# Patient Record
Sex: Female | Born: 1965 | Race: Black or African American | Hispanic: No | Marital: Married | Smoking: Never smoker
Health system: Southern US, Community
[De-identification: ages and names within clinical notes are randomized; demographics above are authoritative.]

## PROBLEM LIST (undated history)

## (undated) DIAGNOSIS — T7840XA Allergy, unspecified, initial encounter: Secondary | ICD-10-CM

## (undated) DIAGNOSIS — L309 Dermatitis, unspecified: Secondary | ICD-10-CM

## (undated) DIAGNOSIS — N3946 Mixed incontinence: Secondary | ICD-10-CM

## (undated) DIAGNOSIS — Z975 Presence of (intrauterine) contraceptive device: Secondary | ICD-10-CM

## (undated) DIAGNOSIS — Z973 Presence of spectacles and contact lenses: Secondary | ICD-10-CM

## (undated) DIAGNOSIS — K219 Gastro-esophageal reflux disease without esophagitis: Secondary | ICD-10-CM

## (undated) DIAGNOSIS — J302 Other seasonal allergic rhinitis: Secondary | ICD-10-CM

## (undated) DIAGNOSIS — I10 Essential (primary) hypertension: Secondary | ICD-10-CM

## (undated) HISTORY — DX: Gastro-esophageal reflux disease without esophagitis: K21.9

## (undated) HISTORY — DX: Other seasonal allergic rhinitis: J30.2

## (undated) HISTORY — DX: Allergy, unspecified, initial encounter: T78.40XA

## (undated) HISTORY — PX: NO PAST SURGERIES: SHX2092

## (undated) HISTORY — DX: Dermatitis, unspecified: L30.9

## (undated) HISTORY — DX: Presence of spectacles and contact lenses: Z97.3

## (undated) HISTORY — DX: Mixed incontinence: N39.46

## (undated) HISTORY — PX: WISDOM TOOTH EXTRACTION: SHX21

## (undated) HISTORY — DX: Presence of (intrauterine) contraceptive device: Z97.5

---

## 2002-04-22 ENCOUNTER — Inpatient Hospital Stay (HOSPITAL_COMMUNITY): Admission: AD | Admit: 2002-04-22 | Discharge: 2002-04-28 | Payer: Self-pay | Admitting: *Deleted

## 2002-04-23 ENCOUNTER — Encounter: Payer: Self-pay | Admitting: *Deleted

## 2002-04-26 ENCOUNTER — Encounter: Payer: Self-pay | Admitting: Family Medicine

## 2002-05-05 ENCOUNTER — Encounter: Admission: RE | Admit: 2002-05-05 | Discharge: 2002-05-05 | Payer: Self-pay | Admitting: *Deleted

## 2002-05-06 ENCOUNTER — Encounter: Admission: RE | Admit: 2002-05-06 | Discharge: 2002-05-06 | Payer: Self-pay | Admitting: *Deleted

## 2002-05-12 ENCOUNTER — Encounter: Admission: RE | Admit: 2002-05-12 | Discharge: 2002-05-12 | Payer: Self-pay | Admitting: *Deleted

## 2002-05-26 ENCOUNTER — Encounter: Admission: RE | Admit: 2002-05-26 | Discharge: 2002-05-26 | Payer: Self-pay | Admitting: *Deleted

## 2002-06-07 ENCOUNTER — Inpatient Hospital Stay (HOSPITAL_COMMUNITY): Admission: AD | Admit: 2002-06-07 | Discharge: 2002-06-13 | Payer: Self-pay | Admitting: Obstetrics and Gynecology

## 2002-06-07 ENCOUNTER — Encounter: Payer: Self-pay | Admitting: Obstetrics and Gynecology

## 2002-06-14 ENCOUNTER — Encounter: Payer: Self-pay | Admitting: Obstetrics and Gynecology

## 2002-06-14 ENCOUNTER — Inpatient Hospital Stay (HOSPITAL_COMMUNITY): Admission: AD | Admit: 2002-06-14 | Discharge: 2002-06-18 | Payer: Self-pay | Admitting: Obstetrics and Gynecology

## 2002-07-20 ENCOUNTER — Encounter: Admission: RE | Admit: 2002-07-20 | Discharge: 2002-07-20 | Payer: Self-pay | Admitting: *Deleted

## 2002-08-19 ENCOUNTER — Emergency Department (HOSPITAL_COMMUNITY): Admission: EM | Admit: 2002-08-19 | Discharge: 2002-08-19 | Payer: Self-pay | Admitting: Emergency Medicine

## 2002-09-06 ENCOUNTER — Emergency Department (HOSPITAL_COMMUNITY): Admission: EM | Admit: 2002-09-06 | Discharge: 2002-09-06 | Payer: Self-pay | Admitting: Emergency Medicine

## 2003-01-08 ENCOUNTER — Emergency Department (HOSPITAL_COMMUNITY): Admission: AD | Admit: 2003-01-08 | Discharge: 2003-01-08 | Payer: Self-pay | Admitting: Family Medicine

## 2003-01-13 ENCOUNTER — Inpatient Hospital Stay (HOSPITAL_COMMUNITY): Admission: AD | Admit: 2003-01-13 | Discharge: 2003-01-13 | Payer: Self-pay | Admitting: Obstetrics and Gynecology

## 2003-01-22 DIAGNOSIS — Z975 Presence of (intrauterine) contraceptive device: Secondary | ICD-10-CM

## 2003-01-22 HISTORY — DX: Presence of (intrauterine) contraceptive device: Z97.5

## 2003-02-02 ENCOUNTER — Encounter: Admission: RE | Admit: 2003-02-02 | Discharge: 2003-02-02 | Payer: Self-pay | Admitting: *Deleted

## 2003-02-16 ENCOUNTER — Encounter: Admission: RE | Admit: 2003-02-16 | Discharge: 2003-02-16 | Payer: Self-pay | Admitting: *Deleted

## 2003-03-02 ENCOUNTER — Encounter: Admission: RE | Admit: 2003-03-02 | Discharge: 2003-03-02 | Payer: Self-pay | Admitting: *Deleted

## 2003-03-09 ENCOUNTER — Ambulatory Visit (HOSPITAL_COMMUNITY): Admission: RE | Admit: 2003-03-09 | Discharge: 2003-03-09 | Payer: Self-pay | Admitting: *Deleted

## 2003-03-09 ENCOUNTER — Encounter: Admission: RE | Admit: 2003-03-09 | Discharge: 2003-03-09 | Payer: Self-pay | Admitting: *Deleted

## 2003-03-23 ENCOUNTER — Encounter: Admission: RE | Admit: 2003-03-23 | Discharge: 2003-03-23 | Payer: Self-pay | Admitting: *Deleted

## 2003-04-06 ENCOUNTER — Encounter: Admission: RE | Admit: 2003-04-06 | Discharge: 2003-04-06 | Payer: Self-pay | Admitting: *Deleted

## 2003-04-20 ENCOUNTER — Encounter: Admission: RE | Admit: 2003-04-20 | Discharge: 2003-04-20 | Payer: Self-pay | Admitting: *Deleted

## 2003-05-04 ENCOUNTER — Encounter: Admission: RE | Admit: 2003-05-04 | Discharge: 2003-05-04 | Payer: Self-pay | Admitting: Obstetrics & Gynecology

## 2003-05-11 ENCOUNTER — Encounter: Admission: RE | Admit: 2003-05-11 | Discharge: 2003-05-11 | Payer: Self-pay | Admitting: *Deleted

## 2003-05-11 ENCOUNTER — Ambulatory Visit (HOSPITAL_COMMUNITY): Admission: RE | Admit: 2003-05-11 | Discharge: 2003-05-11 | Payer: Self-pay | Admitting: Obstetrics and Gynecology

## 2003-05-18 ENCOUNTER — Encounter: Admission: RE | Admit: 2003-05-18 | Discharge: 2003-05-18 | Payer: Self-pay | Admitting: Obstetrics & Gynecology

## 2003-06-01 ENCOUNTER — Encounter: Admission: RE | Admit: 2003-06-01 | Discharge: 2003-06-01 | Payer: Self-pay | Admitting: *Deleted

## 2003-06-01 ENCOUNTER — Ambulatory Visit (HOSPITAL_COMMUNITY): Admission: RE | Admit: 2003-06-01 | Discharge: 2003-06-01 | Payer: Self-pay | Admitting: Obstetrics & Gynecology

## 2003-06-08 ENCOUNTER — Encounter: Admission: RE | Admit: 2003-06-08 | Discharge: 2003-06-08 | Payer: Self-pay | Admitting: *Deleted

## 2003-06-14 ENCOUNTER — Ambulatory Visit (HOSPITAL_COMMUNITY): Admission: RE | Admit: 2003-06-14 | Discharge: 2003-06-14 | Payer: Self-pay | Admitting: Obstetrics and Gynecology

## 2003-06-15 ENCOUNTER — Encounter: Admission: RE | Admit: 2003-06-15 | Discharge: 2003-06-15 | Payer: Self-pay | Admitting: *Deleted

## 2003-06-17 ENCOUNTER — Encounter: Admission: RE | Admit: 2003-06-17 | Discharge: 2003-06-17 | Payer: Self-pay | Admitting: Family Medicine

## 2003-06-22 ENCOUNTER — Encounter: Admission: RE | Admit: 2003-06-22 | Discharge: 2003-06-22 | Payer: Self-pay | Admitting: *Deleted

## 2003-06-24 ENCOUNTER — Encounter: Admission: RE | Admit: 2003-06-24 | Discharge: 2003-06-24 | Payer: Self-pay | Admitting: Obstetrics and Gynecology

## 2003-06-29 ENCOUNTER — Encounter: Admission: RE | Admit: 2003-06-29 | Discharge: 2003-06-29 | Payer: Self-pay | Admitting: *Deleted

## 2003-07-01 ENCOUNTER — Encounter: Admission: RE | Admit: 2003-07-01 | Discharge: 2003-07-01 | Payer: Self-pay | Admitting: Family Medicine

## 2003-07-05 ENCOUNTER — Observation Stay (HOSPITAL_COMMUNITY): Admission: AD | Admit: 2003-07-05 | Discharge: 2003-07-05 | Payer: Self-pay | Admitting: *Deleted

## 2003-07-07 ENCOUNTER — Inpatient Hospital Stay (HOSPITAL_COMMUNITY): Admission: AD | Admit: 2003-07-07 | Discharge: 2003-07-09 | Payer: Self-pay | Admitting: Obstetrics and Gynecology

## 2003-07-22 ENCOUNTER — Encounter (INDEPENDENT_AMBULATORY_CARE_PROVIDER_SITE_OTHER): Payer: Self-pay | Admitting: *Deleted

## 2003-07-22 LAB — CONVERTED CEMR LAB

## 2003-07-23 ENCOUNTER — Emergency Department (HOSPITAL_COMMUNITY): Admission: EM | Admit: 2003-07-23 | Discharge: 2003-07-23 | Payer: Self-pay | Admitting: *Deleted

## 2003-09-28 ENCOUNTER — Ambulatory Visit: Payer: Self-pay | Admitting: Family Medicine

## 2003-12-19 ENCOUNTER — Ambulatory Visit: Payer: Self-pay | Admitting: Family Medicine

## 2003-12-19 ENCOUNTER — Other Ambulatory Visit: Admission: RE | Admit: 2003-12-19 | Discharge: 2003-12-19 | Payer: Self-pay | Admitting: Family Medicine

## 2004-05-14 ENCOUNTER — Ambulatory Visit: Payer: Self-pay | Admitting: Family Medicine

## 2004-06-22 ENCOUNTER — Ambulatory Visit: Payer: Self-pay | Admitting: Family Medicine

## 2004-08-26 ENCOUNTER — Emergency Department (HOSPITAL_COMMUNITY): Admission: EM | Admit: 2004-08-26 | Discharge: 2004-08-26 | Payer: Self-pay | Admitting: Emergency Medicine

## 2004-08-27 ENCOUNTER — Ambulatory Visit: Payer: Self-pay | Admitting: Sports Medicine

## 2004-09-12 ENCOUNTER — Ambulatory Visit: Payer: Self-pay | Admitting: Family Medicine

## 2004-10-09 ENCOUNTER — Ambulatory Visit (HOSPITAL_COMMUNITY): Admission: RE | Admit: 2004-10-09 | Discharge: 2004-10-09 | Payer: Self-pay | Admitting: Family Medicine

## 2004-10-09 ENCOUNTER — Ambulatory Visit: Payer: Self-pay | Admitting: Family Medicine

## 2004-12-21 ENCOUNTER — Ambulatory Visit: Payer: Self-pay | Admitting: Family Medicine

## 2005-01-01 ENCOUNTER — Ambulatory Visit: Payer: Self-pay | Admitting: Family Medicine

## 2005-01-08 ENCOUNTER — Ambulatory Visit: Payer: Self-pay | Admitting: Sports Medicine

## 2005-01-31 ENCOUNTER — Ambulatory Visit: Payer: Self-pay | Admitting: Family Medicine

## 2005-01-31 ENCOUNTER — Encounter: Admission: RE | Admit: 2005-01-31 | Discharge: 2005-01-31 | Payer: Self-pay | Admitting: Sports Medicine

## 2005-02-14 ENCOUNTER — Ambulatory Visit (HOSPITAL_COMMUNITY): Admission: RE | Admit: 2005-02-14 | Discharge: 2005-02-14 | Payer: Self-pay | Admitting: Cardiology

## 2005-03-20 ENCOUNTER — Ambulatory Visit: Payer: Self-pay | Admitting: Family Medicine

## 2005-05-17 ENCOUNTER — Ambulatory Visit: Payer: Self-pay | Admitting: Family Medicine

## 2005-05-22 ENCOUNTER — Emergency Department (HOSPITAL_COMMUNITY): Admission: EM | Admit: 2005-05-22 | Discharge: 2005-05-22 | Payer: Self-pay | Admitting: Family Medicine

## 2005-05-30 ENCOUNTER — Ambulatory Visit: Payer: Self-pay | Admitting: Family Medicine

## 2005-06-05 ENCOUNTER — Ambulatory Visit: Payer: Self-pay | Admitting: Family Medicine

## 2005-06-13 ENCOUNTER — Ambulatory Visit: Payer: Self-pay | Admitting: Sports Medicine

## 2005-07-07 ENCOUNTER — Emergency Department (HOSPITAL_COMMUNITY): Admission: EM | Admit: 2005-07-07 | Discharge: 2005-07-07 | Payer: Self-pay | Admitting: Emergency Medicine

## 2005-07-17 ENCOUNTER — Ambulatory Visit: Payer: Self-pay | Admitting: Family Medicine

## 2005-09-19 ENCOUNTER — Ambulatory Visit: Payer: Self-pay | Admitting: Family Medicine

## 2005-10-04 ENCOUNTER — Ambulatory Visit: Payer: Self-pay | Admitting: Family Medicine

## 2005-10-14 ENCOUNTER — Ambulatory Visit: Payer: Self-pay | Admitting: Family Medicine

## 2005-10-16 ENCOUNTER — Encounter: Admission: RE | Admit: 2005-10-16 | Discharge: 2006-01-14 | Payer: Self-pay | Admitting: Family Medicine

## 2005-10-31 ENCOUNTER — Ambulatory Visit: Payer: Self-pay | Admitting: Family Medicine

## 2006-03-20 DIAGNOSIS — F411 Generalized anxiety disorder: Secondary | ICD-10-CM | POA: Insufficient documentation

## 2006-03-20 DIAGNOSIS — J309 Allergic rhinitis, unspecified: Secondary | ICD-10-CM | POA: Insufficient documentation

## 2006-03-20 DIAGNOSIS — R252 Cramp and spasm: Secondary | ICD-10-CM | POA: Insufficient documentation

## 2006-03-20 DIAGNOSIS — K219 Gastro-esophageal reflux disease without esophagitis: Secondary | ICD-10-CM | POA: Insufficient documentation

## 2006-03-20 DIAGNOSIS — IMO0001 Reserved for inherently not codable concepts without codable children: Secondary | ICD-10-CM | POA: Insufficient documentation

## 2006-03-20 DIAGNOSIS — E876 Hypokalemia: Secondary | ICD-10-CM | POA: Insufficient documentation

## 2006-03-20 DIAGNOSIS — I10 Essential (primary) hypertension: Secondary | ICD-10-CM | POA: Insufficient documentation

## 2006-03-20 DIAGNOSIS — F339 Major depressive disorder, recurrent, unspecified: Secondary | ICD-10-CM | POA: Insufficient documentation

## 2006-03-21 ENCOUNTER — Encounter (INDEPENDENT_AMBULATORY_CARE_PROVIDER_SITE_OTHER): Payer: Self-pay | Admitting: *Deleted

## 2010-02-11 ENCOUNTER — Encounter: Payer: Self-pay | Admitting: *Deleted

## 2010-10-09 ENCOUNTER — Emergency Department (HOSPITAL_COMMUNITY): Payer: Self-pay

## 2010-10-09 ENCOUNTER — Emergency Department (HOSPITAL_COMMUNITY)
Admission: EM | Admit: 2010-10-09 | Discharge: 2010-10-09 | Disposition: A | Discharge status: Home / Self Care | Payer: Self-pay | Attending: Emergency Medicine | Admitting: Emergency Medicine

## 2010-10-09 DIAGNOSIS — R0602 Shortness of breath: Secondary | ICD-10-CM | POA: Insufficient documentation

## 2010-10-09 DIAGNOSIS — F329 Major depressive disorder, single episode, unspecified: Secondary | ICD-10-CM | POA: Insufficient documentation

## 2010-10-09 DIAGNOSIS — K219 Gastro-esophageal reflux disease without esophagitis: Secondary | ICD-10-CM | POA: Insufficient documentation

## 2010-10-09 DIAGNOSIS — I10 Essential (primary) hypertension: Secondary | ICD-10-CM | POA: Insufficient documentation

## 2010-10-09 DIAGNOSIS — F3289 Other specified depressive episodes: Secondary | ICD-10-CM | POA: Insufficient documentation

## 2010-10-09 DIAGNOSIS — R1013 Epigastric pain: Secondary | ICD-10-CM | POA: Insufficient documentation

## 2010-10-09 DIAGNOSIS — R0789 Other chest pain: Secondary | ICD-10-CM | POA: Insufficient documentation

## 2010-10-09 DIAGNOSIS — R110 Nausea: Secondary | ICD-10-CM | POA: Insufficient documentation

## 2010-10-09 LAB — CBC
HCT: 35.9 % — ABNORMAL LOW (ref 36.0–46.0)
Hemoglobin: 12 g/dL (ref 12.0–15.0)
MCH: 28.8 pg (ref 26.0–34.0)
MCHC: 33.4 g/dL (ref 30.0–36.0)
MCV: 86.3 fL (ref 78.0–100.0)
Platelets: 214 10*3/uL (ref 150–400)
RBC: 4.16 MIL/uL (ref 3.87–5.11)
RDW: 13.3 % (ref 11.5–15.5)
WBC: 7.4 10*3/uL (ref 4.0–10.5)

## 2010-10-09 LAB — BASIC METABOLIC PANEL
BUN: 9 mg/dL (ref 6–23)
CO2: 26 mEq/L (ref 19–32)
Calcium: 8.9 mg/dL (ref 8.4–10.5)
Chloride: 105 mEq/L (ref 96–112)
Creatinine, Ser: 0.55 mg/dL (ref 0.50–1.10)
GFR calc Af Amer: >60 mL/min (ref >60)
GFR calc non Af Amer: >60 mL/min (ref >60)
Glucose, Bld: 105 mg/dL — ABNORMAL HIGH (ref 70–99)
Potassium: 3.6 mEq/L (ref 3.5–5.1)
Sodium: 137 mEq/L (ref 135–145)

## 2010-10-09 LAB — DIFFERENTIAL
Basophils Absolute: 0 10*3/uL (ref 0.0–0.1)
Basophils Relative: 0 % (ref 0–1)
Eosinophils Absolute: 0.2 10*3/uL (ref 0.0–0.7)
Eosinophils Relative: 2 % (ref 0–5)
Lymphocytes Relative: 37 % (ref 12–46)
Lymphs Abs: 2.7 10*3/uL (ref 0.7–4.0)
Monocytes Absolute: 0.4 10*3/uL (ref 0.1–1.0)
Monocytes Relative: 6 % (ref 3–12)
Neutro Abs: 4 10*3/uL (ref 1.7–7.7)
Neutrophils Relative %: 54 % (ref 43–77)

## 2010-10-09 LAB — D-DIMER, QUANTITATIVE: D-Dimer, Quant: 0.22 ug/mL-FEU (ref 0.00–0.48)

## 2010-12-01 ENCOUNTER — Emergency Department (HOSPITAL_COMMUNITY)
Admission: EM | Admit: 2010-12-01 | Discharge: 2010-12-01 | Disposition: A | Discharge status: Home / Self Care | Payer: Self-pay | Attending: Emergency Medicine | Admitting: Emergency Medicine

## 2010-12-01 ENCOUNTER — Encounter: Payer: Self-pay | Admitting: *Deleted

## 2010-12-01 DIAGNOSIS — R070 Pain in throat: Secondary | ICD-10-CM | POA: Insufficient documentation

## 2010-12-01 DIAGNOSIS — I889 Nonspecific lymphadenitis, unspecified: Secondary | ICD-10-CM | POA: Insufficient documentation

## 2010-12-01 HISTORY — DX: Essential (primary) hypertension: I10

## 2010-12-01 MED ORDER — CEPHALEXIN 500 MG PO CAPS: 500.0000 mg | ORAL_CAPSULE | Freq: Four times a day (QID) | ORAL | Status: AC

## 2010-12-01 MED ORDER — NAPROXEN 500 MG PO TABS: 500.0000 mg | ORAL_TABLET | Freq: Two times a day (BID) | ORAL | Status: AC

## 2010-12-01 NOTE — ED Provider Notes (Signed)
History     CSN: [id number] Arrival date & time: 12/01/2010  2:51 PM   First MD Initiated Contact with Patient 12/01/10 1906      Chief Complaint  Patient presents with  . Sore Throat    problems swallowing    (Consider location/radiation/quality/duration/timing/severity/associated sxs/prior treatment) Patient is a 45 y.o. female presenting with pharyngitis. The history is provided by the patient (The patient complains of a sore throat and more pain in her left side up an).  Sore Throat This is a new problem. The current episode started 12 to 24 hours ago. The problem occurs constantly. The problem has not changed since onset.Pertinent negatives include no chest pain, no abdominal pain and no headaches. The symptoms are aggravated by nothing. The symptoms are relieved by nothing. She has tried nothing for the symptoms.    Past Medical History  Diagnosis Date  . Hypertension     History reviewed. No pertinent past surgical history.  No family history on file.  History  Substance Use Topics  . Smoking status: Never Smoker   . Smokeless tobacco: Not on file  . Alcohol Use: No    OB History    Grav Para Term Preterm Abortions TAB SAB Ect Mult Living                  Review of Systems  Constitutional: Negative for fatigue.  HENT: Positive for sore throat. Negative for congestion, sinus pressure and ear discharge.   Eyes: Negative for discharge.  Respiratory: Negative for cough.   Cardiovascular: Negative for chest pain.  Gastrointestinal: Negative for abdominal pain and diarrhea.  Genitourinary: Negative for frequency and hematuria.  Musculoskeletal: Negative for back pain.  Skin: Negative for rash.  Neurological: Negative for seizures and headaches.  Hematological: Negative.   Psychiatric/Behavioral: Negative for hallucinations.    Allergies  Review of patient's allergies indicates no known allergies.  Home Medications   Current Outpatient Rx  Name Route  Sig Dispense Refill  . IBUPROFEN 200 MG PO TABS Oral Take 200 mg by mouth every 6 (six) hours as needed. For pain     . CEPHALEXIN 500 MG PO CAPS Oral Take 1 capsule (500 mg total) by mouth 4 (four) times daily. 28 capsule 0  . NAPROXEN 500 MG PO TABS Oral Take 1 tablet (500 mg total) by mouth 2 (two) times daily. 14 tablet 0    BP 156/101  Pulse 93  Temp(Src) 98.6 F (37 C) (Oral)  Resp 22  SpO2 98%  Physical Exam  Constitutional: She is oriented to person, place, and time. She appears well-developed.  HENT:  Head: Normocephalic and atraumatic.  Eyes: Conjunctivae and EOM are normal. No scleral icterus.  Neck: Neck supple. No thyromegaly present.       Tender left ant.  Lymph nodes to neck  Cardiovascular: Normal rate and regular rhythm.  Exam reveals no gallop and no friction rub.   No murmur heard. Pulmonary/Chest: No stridor. She has no wheezes. She has no rales. She exhibits no tenderness.  Abdominal: She exhibits no distension. There is no tenderness. There is no rebound.  Musculoskeletal: Normal range of motion. She exhibits no edema.  Lymphadenopathy:    She has cervical adenopathy.  Neurological: She is oriented to person, place, and time. Coordination normal.  Skin: No rash noted. No erythema.  Psychiatric: She has a normal mood and affect. Her behavior is normal.    ED Course  Procedures (including critical care time)  Labs  Reviewed - No data to display No results found.   1. Lymphadenitis       MDM  lymphadenitis        Benny Lennert, MD 12/01/10 [phone]

## 2010-12-01 NOTE — ED Notes (Signed)
Pt is here with left lateral neck throat hurting for last three days.  Bothers her more when she lays down and feels like she cannot swallow.  Pt is talking in complete sentences

## 2012-09-15 ENCOUNTER — Encounter: Payer: Self-pay | Admitting: Medical

## 2012-09-15 ENCOUNTER — Ambulatory Visit (INDEPENDENT_AMBULATORY_CARE_PROVIDER_SITE_OTHER): Payer: BC Managed Care – PPO | Admitting: Medical

## 2012-09-15 VITALS — BP 132/90 | HR 80 | Temp 97.9°F | Resp 16 | Wt 207.0 lb

## 2012-09-15 DIAGNOSIS — IMO0001 Reserved for inherently not codable concepts without codable children: Secondary | ICD-10-CM

## 2012-09-15 DIAGNOSIS — R21 Rash and other nonspecific skin eruption: Secondary | ICD-10-CM

## 2012-09-15 NOTE — Progress Notes (Signed)
Subjective:   Sharon Bond is a 47 y.o. female who presents as a new patient today for evaluation of a rash involving the lower legs. Rash started 2 months ago. Lesions are black/brown on some of the areas, and other areas are red/purple, and the bigger ones are flat, the small ones (red/purple) are slightly raised in texture. Rash has changed over time. Rash is itchy at times, but has burning sensation under the skin. Associated symptoms: myalgia and aches in lower legs bilat. Patient denies: abdominal pain, arthralgia, decrease in appetite, fever, nausea and vomiting. Patient has not had contacts with similar rash. Patient has not had new exposures.  Has used various OTC creams including Lamisil, hydrocortisone, eczema creams.  The creams helps moisturize and stop itching but the rash isn't resolving.    Past Medical History  Diagnosis Date  . Hypertension   . Seasonal allergic rhinitis   . Wears glasses    Family History  Problem Relation Age of Onset  . Diabetes Mother   . Hypertension Mother     The following portions of the patient's history were reviewed and updated as appropriate: allergies, current medications, past family history, past medical history, past social history and problem list.  Review of Systems As in subjective above   Objective:   Gen: wd, wn, nad, AA female Skin: large somewhat faint brown black flat macular rash from right lower 1/3 of leg laterally down to ankle and lateral foot. Similar but slightly smaller macular rash on left lower leg laterally and ankle.  Smaller triangular brown black macular rash on left anterior medial foot.  Few other scattered similar macular rashes of lateral lower legs, proximal legs.  There are a few raised somewhat purplish rashes on bilat posterior legs over the calves that are tender.  No warmth, induration, fluctuance, or mass. One of the raised tender lesions has 1 central 2mm diameter vesicular lesion. No obvious  varicosities. Legs tender posteriorly over the raised rashes, otherwise legs nontender.   Lower legs seem slightly swollen generalized, but non pitting.   neurovascularly intact LE MSK exam otherwise normal Oral cavity: MMM, no lesions Neck: supple, no lymphadenopathy, no thyromegaly, no masses Heart: RRR, normal S1, S2, no murmurs Lungs: CTA bilaterally, no wheezes, rhonchi, or rales  Assessment:   Encounter Diagnoses  Name Primary?  . Rash and nonspecific skin eruption Yes  . Myalgia and myositis      Plan:   Discussed symptoms and exam findings.  Etiology unclear.  CBC and Sed rate labs, and referral to dermatology.

## 2012-09-16 ENCOUNTER — Telehealth: Payer: Self-pay | Admitting: Family Medicine

## 2012-09-16 LAB — CBC WITH DIFFERENTIAL/PLATELET
Basophils Absolute: 0 10*3/uL (ref 0.0–0.1)
Basophils Relative: 0 % (ref 0–1)
Eosinophils Absolute: 0.2 10*3/uL (ref 0.0–0.7)
Eosinophils Relative: 3 % (ref 0–5)
HCT: 40.4 % (ref 36.0–46.0)
Hemoglobin: 13.4 g/dL (ref 12.0–15.0)
Lymphocytes Relative: 35 % (ref 12–46)
Lymphs Abs: 2.4 10*3/uL (ref 0.7–4.0)
MCH: 28.7 pg (ref 26.0–34.0)
MCHC: 33.2 g/dL (ref 30.0–36.0)
MCV: 86.5 fL (ref 78.0–100.0)
Monocytes Absolute: 0.4 10*3/uL (ref 0.1–1.0)
Monocytes Relative: 5 % (ref 3–12)
Neutro Abs: 3.9 10*3/uL (ref 1.7–7.7)
Neutrophils Relative %: 57 % (ref 43–77)
Platelets: 253 10*3/uL (ref 150–400)
RBC: 4.67 MIL/uL (ref 3.87–5.11)
RDW: 14.5 % (ref 11.5–15.5)
WBC: 6.9 10*3/uL (ref 4.0–10.5)

## 2012-09-16 LAB — SEDIMENTATION RATE: Sed Rate: 14 mm/hr (ref 0–22)

## 2012-09-16 NOTE — Telephone Encounter (Signed)
Patient is aware of her appointment to see the dermatologist on 10/12/12 at dermatology spec. At 1015 am. CLS

## 2012-09-16 NOTE — Telephone Encounter (Signed)
Message copied by Janeice Robinson on Wed Sep 16, 2012  4:36 PM ------      Message from: Jac Canavan      Created: Tue Sep 15, 2012 10:34 PM       Dermatology referral ------

## 2012-10-07 ENCOUNTER — Ambulatory Visit (INDEPENDENT_AMBULATORY_CARE_PROVIDER_SITE_OTHER): Payer: BC Managed Care – PPO | Admitting: Family Medicine

## 2012-10-07 ENCOUNTER — Encounter: Payer: Self-pay | Admitting: Family Medicine

## 2012-10-07 VITALS — BP 156/102 | HR 84 | Temp 98.3°F | Ht 63.0 in | Wt 207.0 lb

## 2012-10-07 DIAGNOSIS — J309 Allergic rhinitis, unspecified: Secondary | ICD-10-CM

## 2012-10-07 DIAGNOSIS — J029 Acute pharyngitis, unspecified: Secondary | ICD-10-CM

## 2012-10-07 DIAGNOSIS — R030 Elevated blood-pressure reading, without diagnosis of hypertension: Secondary | ICD-10-CM

## 2012-10-07 DIAGNOSIS — B349 Viral infection, unspecified: Secondary | ICD-10-CM

## 2012-10-07 DIAGNOSIS — IMO0001 Reserved for inherently not codable concepts without codable children: Secondary | ICD-10-CM

## 2012-10-07 DIAGNOSIS — B9789 Other viral agents as the cause of diseases classified elsewhere: Secondary | ICD-10-CM

## 2012-10-07 LAB — POCT RAPID STREP A (OFFICE): Rapid Strep A Screen: NEGATIVE

## 2012-10-07 MED ORDER — FLUTICASONE PROPIONATE 50 MCG/ACT NA SUSP: 2.0000 | Freq: Every day | NASAL | Status: DC

## 2012-10-07 NOTE — Patient Instructions (Addendum)
Start flonase for allergies.  Gentle sniffs. You may continue to use the claritin (plain, not the D version) Use mucinex (plain guaifenesin) to help loosen up the phlegm Consider sinus rinses or neti-pot  You have allergies, as well as a virus.  Expect the chills, body aches to improve over the next 3-5 days.  Allergy symptoms may persist for much longer. If you develop high fevers, shortness of breath, worsening cough, vomiting or other new/serious complaints, return for re-evaluation  Continue to monitor blood pressures elsewhere.  Avoid decongestants.  Low sodium diet.  Get regular exercise, try and lose weight. Bring a list of your blood pressures from the pharmacy to your next visit.  If they truly remain 150-160/100's, you don't need to wait until your physical, you can schedule a sooner appointment to discuss blood pressure  Sodium-Controlled Diet Sodium is a mineral. It is found in many foods. Sodium may be found naturally or added during the making of a food. The most common form of sodium is salt, which is made up of sodium and chloride. Reducing your sodium intake involves changing your eating habits. The following guidelines will help you reduce the sodium in your diet:  Stop using the salt shaker.  Use salt sparingly in cooking and baking.  Substitute with sodium-free seasonings and spices.  Do not use a salt substitute (potassium chloride) without your caregiver's permission.  Include a variety of fresh, unprocessed foods in your diet.  Limit the use of processed and convenience foods that are high in sodium. USE THE FOLLOWING FOODS SPARINGLY: Breads/Starches  Commercial bread stuffing, commercial pancake or waffle mixes, coating mixes. Waffles. Croutons. Prepared (boxed or frozen) potato, rice, or noodle mixes that contain salt or sodium. Salted [place] fries or hash browns. Salted popcorn, breads, crackers, chips, or snack foods. Vegetables  Vegetables canned with salt  or prepared in cream, butter, or cheese sauces. Sauerkraut. Tomato or vegetable juices canned with salt.  Fresh vegetables are allowed if rinsed thoroughly. Fruit  Fruit is okay to eat. Meat and Meat Substitutes  Salted or smoked meats, such as bacon or Canadian bacon, chipped or corned beef, hot dogs, salt pork, luncheon meats, pastrami, ham, or sausage. Canned or smoked fish, poultry, or meat. Processed cheese or cheese spreads, blue or Roquefort cheese. Battered or frozen fish products. Prepared spaghetti sauce. Baked beans. Reuben sandwiches. Salted nuts. Caviar. Milk  Limit buttermilk to 1 cup per week. Soups and Combination Foods  Bouillon cubes, canned or dried soups, broth, consomm. Convenience (frozen or packaged) dinners with more than 600 mg sodium. Pot pies, pizza, Asian food, fast food cheeseburgers, and specialty sandwiches. Desserts and Sweets  Regular (salted) desserts, pie, commercial fruit snack pies, commercial snack cakes, canned puddings.  Eat desserts and sweets in moderation. Fats and Oils  Gravy mixes or canned gravy. No more than 1 to 2 tbs of salad dressing. Chip dips.  Eat fats and oils in moderation. Beverages  See those listed under the vegetables and milk groups. Condiments  Ketchup, mustard, meat sauces, salsa, regular (salted) and lite soy sauce or mustard. Dill pickles, olives, meat tenderizer. Prepared horseradish or pickle relish. Dutch-processed cocoa. Baking powder or baking soda used medicinally. Worcestershire sauce. "Light" salt. Salt substitute, unless approved by your caregiver. Document Released: 06/29/2001 Document Revised: 04/01/2011 Document Reviewed: 01/30/2009 [facility] Patient Information 2014 [place], [place].  Hypertension As your heart beats, it forces blood through your arteries. This force is your blood pressure. If the pressure is too high,  it is called hypertension (HTN) or high blood pressure. HTN is dangerous because you may  have it and not know it. High blood pressure may mean that your heart has to work harder to pump blood. Your arteries may be narrow or stiff. The extra work puts you at risk for heart disease, stroke, and other problems.  Blood pressure consists of two numbers, a higher number over a lower, 110/72, for example. It is stated as "110 over 72." The ideal is below 120 for the top number (systolic) and under 80 for the bottom (diastolic). Write down your blood pressure today. You should pay close attention to your blood pressure if you have certain conditions such as:  Heart failure.  Prior heart attack.  Diabetes  Chronic kidney disease.  Prior stroke.  Multiple risk factors for heart disease. To see if you have HTN, your blood pressure should be measured while you are seated with your arm held at the level of the heart. It should be measured at least twice. A one-time elevated blood pressure reading (especially in the Emergency Department) does not mean that you need treatment. There may be conditions in which the blood pressure is different between your right and left arms. It is important to see your caregiver soon for a recheck. Most people have essential hypertension which means that there is not a specific cause. This type of high blood pressure may be lowered by changing lifestyle factors such as:  Stress.  Smoking.  Lack of exercise.  Excessive weight.  Drug/tobacco/alcohol use.  Eating less salt. Most people do not have symptoms from high blood pressure until it has caused damage to the body. Effective treatment can often prevent, delay or reduce that damage. TREATMENT  When a cause has been identified, treatment for high blood pressure is directed at the cause. There are a large number of medications to treat HTN. These fall into several categories, and your caregiver will help you select the medicines that are best for you. Medications may have side effects. You should review  side effects with your caregiver. If your blood pressure stays high after you have made lifestyle changes or started on medicines,   Your medication(s) may need to be changed.  Other problems may need to be addressed.  Be certain you understand your prescriptions, and know how and when to take your medicine.  Be sure to follow up with your caregiver within the time frame advised (usually within two weeks) to have your blood pressure rechecked and to review your medications.  If you are taking more than one medicine to lower your blood pressure, make sure you know how and at what times they should be taken. Taking two medicines at the same time can result in blood pressure that is too low. SEEK IMMEDIATE MEDICAL CARE IF:  You develop a severe headache, blurred or changing vision, or confusion.  You have unusual weakness or numbness, or a faint feeling.  You have severe chest or abdominal pain, vomiting, or breathing problems. MAKE SURE YOU:   Understand these instructions.  Will watch your condition.  Will get help right away if you are not doing well or get worse. Document Released: 01/07/2005 Document Revised: 04/01/2011 Document Reviewed: 08/28/2007 [facility] Patient Information 2014 [place], [place].

## 2012-10-07 NOTE — Progress Notes (Signed)
Chief Complaint  Patient presents with  . Nasal Congestion    started last Thursday with nasal congestion, now has ST and feels like there is a ball on the left side of her throat.    Six days ago she started with nasal congestion, like her typical allergies.  She used some Claritin, which helped with symptoms. She hasn't taken any claritin today.  Sore throat started over the weekend, with postnasal drainage and throat-clearing.  3 days ago "I was down"--couldn't do anything, very fatigued for Sunday and Monday.  Last night she developed what felt like a ball in the left side of her throat--felt like it was cutting off her air when she was trying to sleep, when lying down.  Didn't sleep much last night.  She had some chills yesterday, no known fevers.  Has some achiness today.  Son was sick 2 weeks ago, no other sick contacts.  Nasal mucus is blood-tinged.  Feels like there is phlegm stuck in the left side of her throat.  Hasn't coughed up any mucus.  OTC meds used--ibuprofen 800 for body aches, and claritin daily.    She reports checking her BP's at the pharmacy, and they have been running high (150/100).  She planned to discuss this at her upcoming physical.  She denies headaches, chest pain.  Avoids decongestants.  Past Medical History  Diagnosis Date  . Hypertension   . Seasonal allergic rhinitis   . Wears glasses    History reviewed. No pertinent past surgical history. History   Social History  . Marital Status: Married    Spouse Name: N/A    Number of Children: N/A  . Years of Education: N/A   Occupational History  . Not on file.   Social History Main Topics  . Smoking status: Never Smoker   . Smokeless tobacco: Never Used  . Alcohol Use: No  . Drug Use: No  . Sexual Activity: Yes    Partners: Male    Birth Control/ Protection: IUD   Other Topics Concern  . Not on file   Social History Narrative  . No narrative on file    No current outpatient prescriptions on file  prior to visit.   No current facility-administered medications on file prior to visit.   Allergies  Allergen Reactions  . Penicillins Swelling    Facial swelling   ROS:  Denies nausea, vomiting, diarrhea, abdominal pain, urinary complaints, chest pain.  Slight shortness of breath yesterday from what she thought was a gas bubble that passed.  PHYSICAL EXAM: BP 160/98  Pulse 84  Temp(Src) 98.3 F (36.8 C) (Oral)  Ht 5\' 3"  (1.6 m)  Wt 207 lb (93.895 kg)  BMI 36.68 kg/m2  LMP 09/30/2012 156/102 on repeat by MD, RA Well developed, pleasant female in no distress.  No throat clearing, sniffles or cough HEENT:  PERRL, EOMI, conjunctiva clear.  Tm's and EAC's normal.  Nasal mucosa moderately edematous, pale, clear mucus OP clear without erythema or exudates Neck: no lymphadenopathy or mass Heart: regular rate and rhythm without murmur Lungs: clear bilaterally Extremities: no edema Psych: normal mood, affect, hygiene and grooming  Rapid strep negative  ASSESSMENT/PLAN:  Sore throat - related to PND, likely from viral syndrome/URI as well as allergies - Plan: Rapid Strep A  Allergic rhinitis, cause unspecified - Plan: fluticasone (FLONASE) 50 MCG/ACT nasal spray  Viral syndrome - supportive measures reviewed, as well as signs/symptoms of bacterial infection.    Elevated blood pressure - reviewed  low sodium diet; avoid decongestants; regular exercise and weight loss encouraged.  continue to monitor and f/u sooner if remains high  25 min visit, more than 1/2 spent counseling in proper use of medications, and blood pressure

## 2012-11-03 ENCOUNTER — Encounter: Payer: Self-pay | Admitting: Medical

## 2012-11-03 ENCOUNTER — Ambulatory Visit (INDEPENDENT_AMBULATORY_CARE_PROVIDER_SITE_OTHER): Payer: BC Managed Care – PPO | Admitting: Medical

## 2012-11-03 VITALS — BP 130/90 | HR 84 | Temp 98.1°F | Resp 18 | Ht 64.0 in | Wt 208.0 lb

## 2012-11-03 DIAGNOSIS — Z975 Presence of (intrauterine) contraceptive device: Secondary | ICD-10-CM

## 2012-11-03 DIAGNOSIS — R102 Pelvic and perineal pain: Secondary | ICD-10-CM

## 2012-11-03 DIAGNOSIS — N3946 Mixed incontinence: Secondary | ICD-10-CM

## 2012-11-03 DIAGNOSIS — E669 Obesity, unspecified: Secondary | ICD-10-CM

## 2012-11-03 DIAGNOSIS — Z1239 Encounter for other screening for malignant neoplasm of breast: Secondary | ICD-10-CM

## 2012-11-03 DIAGNOSIS — Z23 Encounter for immunization: Secondary | ICD-10-CM

## 2012-11-03 DIAGNOSIS — Z0000 Encounter for general adult medical examination without abnormal findings: Secondary | ICD-10-CM

## 2012-11-03 DIAGNOSIS — I10 Essential (primary) hypertension: Secondary | ICD-10-CM

## 2012-11-03 LAB — LIPID PANEL
Cholesterol: 181 mg/dL (ref 0–200)
HDL: 49 mg/dL (ref >39)
LDL Cholesterol: 112 mg/dL — ABNORMAL HIGH (ref 0–99)
Total CHOL/HDL Ratio: 3.7 Ratio
Triglycerides: 101 mg/dL (ref <150)
VLDL: 20 mg/dL (ref 0–40)

## 2012-11-03 LAB — POCT URINALYSIS DIPSTICK
Bilirubin, UA: NEGATIVE
Blood, UA: NEGATIVE
Glucose, UA: NEGATIVE
Ketones, UA: NEGATIVE
Leukocytes, UA: NEGATIVE
Nitrite, UA: NEGATIVE
Protein, UA: NEGATIVE
Spec Grav, UA: 1.015
Urobilinogen, UA: NEGATIVE
pH, UA: 5

## 2012-11-03 LAB — COMPREHENSIVE METABOLIC PANEL
ALT: 8 U/L (ref 0–35)
AST: 11 U/L (ref 0–37)
Albumin: 3.8 g/dL (ref 3.5–5.2)
Alkaline Phosphatase: 81 U/L (ref 39–117)
BUN: 9 mg/dL (ref 6–23)
CO2: 26 mEq/L (ref 19–32)
Calcium: 8.9 mg/dL (ref 8.4–10.5)
Chloride: 106 mEq/L (ref 96–112)
Creat: 0.55 mg/dL (ref 0.50–1.10)
Glucose, Bld: 94 mg/dL (ref 70–99)
Potassium: 4.2 mEq/L (ref 3.5–5.3)
Sodium: 140 mEq/L (ref 135–145)
Total Bilirubin: 0.4 mg/dL (ref 0.3–1.2)
Total Protein: 7 g/dL (ref 6.0–8.3)

## 2012-11-03 LAB — TSH: TSH: 1.12 u[IU]/mL (ref 0.350–4.500)

## 2012-11-03 MED ORDER — METOPROLOL SUCCINATE ER 25 MG PO TB24: 25.0000 mg | ORAL_TABLET | Freq: Every day | ORAL | Status: DC

## 2012-11-03 NOTE — Patient Instructions (Signed)
Begin Toprol 25 mg daily in the morning for blood pressure control and headaches.  Try and get 150 minutes of exercise weekly, work on a healthy low-fat diet.  Work on losing weight gradually over the next several months. Try to set a goal for 1-2 pound weight loss per week.  We are referring you for your first screening mammogram.  We are referring you to gynecology for multiple concerns including Pap smear, pelvic pain, incontinence, and decide on the IUD.  Go back and see dentist for plaque.  We will call back with lab results. Recheck in one month on blood pressure.

## 2012-11-03 NOTE — Progress Notes (Signed)
Subjective:   HPI  Sharon Bond is a 47 y.o. female who presents for a complete physical.  Preventative care: Last ophthalmology visit:yes- [facility] Last dental visit:yes Dr. Excell Seltzer Last colonoscopy:n/a Last mammogram:never Last gynecological exam:2005 Last EKG:2013 @ [facility] Last labs: today  Prior vaccinations: TD or Tdap:11/03/12 Influenza:11/03/12 Pneumococcal:n/a Shingles/Zostavax:n/a  Advanced directive:n/a Health care power of attorney:n/a Living will:n/a  Concerns: Incontinence - happens with walking, sneezing, coughing, if can't get to the bathroom quick enough.  Has urinary frequency as well.  started back in the summer.  Gets some pains at "bottom of her stomach."   HTN - not on medication.  Hx/o being on medication prior though.  Current not exercising much as she has been taking care of husband who broke his leg. At one point was 40lb lighter.    Having hot flashes.  Periods are regular but light, only lasts a few days now.  Has IUD in place since 2005, copper.  Married.  No prior mammogram.   Reviewed their medical, surgical, family, social, medication, and allergy history and updated chart as appropriate.  Past Medical History  Diagnosis Date  . Hypertension   . Seasonal allergic rhinitis   . Wears glasses   . IUD (intrauterine device) in place 2005  . Mixed incontinence     Past Surgical History  Procedure Laterality Date  . Wisdom tooth extraction      History   Social History  . Marital Status: Married    Spouse Name: N/A    Number of Children: N/A  . Years of Education: N/A   Occupational History  . Not on file.   Social History Main Topics  . Smoking status: Never Smoker   . Smokeless tobacco: Never Used  . Alcohol Use: No     Comment: occasionally  . Drug Use: No  . Sexual Activity: Yes    Partners: Male    Birth Control/ Protection: IUD   Other Topics Concern  . Not on file   Social History Narrative   Stay at  home, married, 9yo son, 27yo, 47yo, exercise - some with walking    Family History  Problem Relation Age of Onset  . Diabetes Mother   . Hypertension Mother   . Diabetes Brother   . Heart disease Maternal Aunt   . Cancer Neg Hx   . Stroke Neg Hx     Current outpatient prescriptions:Copper IUD, by Intrauterine route., Disp: , Rfl: ;  ibuprofen (ADVIL,MOTRIN) 800 MG tablet, Take 800 mg by mouth every 8 (eight) hours as needed for pain., Disp: , Rfl: ;  fluticasone (FLONASE) 50 MCG/ACT nasal spray, Place 2 sprays into the nose daily., Disp: 16 g, Rfl: 6  Allergies  Allergen Reactions  . Penicillins Swelling    Facial swelling        Review of Systems Constitutional: -fever, -chills, -sweats, -unexpected weight change, -decreased appetite, -fatigue Allergy: -sneezing, -itching, -congestion Dermatology: -changing moles, --rash, -lumps ENT: -runny nose, +ear pain, -sore throat, -hoarseness, -sinus pain, -teeth pain, - ringing in ears, -hearing loss, -nosebleeds Cardiology: -chest pain, -palpitations, -swelling, -difficulty breathing when lying flat, -waking up short of breath Respiratory: -cough, -shortness of breath, -difficulty breathing with exercise or exertion, -wheezing, -coughing up blood Gastroenterology: -abdominal pain, -nausea, -vomiting, -diarrhea, -constipation, -blood in stool, -changes in bowel movement, -difficulty swallowing or eating Hematology: -bleeding, -bruising  Musculoskeletal: -joint aches, -muscle aches, -joint swelling, -back pain, +neck pain, -cramping, -changes in gait Ophthalmology: denies vision  changes, eye redness, itching, discharge Urology: -burning with urination, -difficulty urinating, -blood in urine, +urinary frequency, -urgency, -incontinence, +leakage Neurology: -headache, -weakness, -tingling, -numbness, -memory loss, -falls, -dizziness Psychology: -depressed mood, -agitation, -sleep problems     Objective:   Physical Exam  BP 130/90   Pulse 84  Temp(Src) 98.1 F (36.7 C) (Oral)  Resp 18  Ht 5\' 4"  (1.626 m)  Wt 208 lb (94.348 kg)  BMI 35.69 kg/m2  LMP 09/30/2012  General appearance: alert, no distress, WD/WN, obese AA female Skin: several benign appearing macules of face and torso, no worrisome lesions HEENT: normocephalic, conjunctiva/corneas normal, sclerae anicteric, PERRLA, EOMi, nares patent, no discharge or erythema, pharynx normal Oral cavity: MMM, tongue normal, teeth in good repair, but moderate lower teeth plaque in general Neck: supple, no lymphadenopathy, no thyromegaly, no masses, normal ROM, no bruits Chest: non tender, normal shape and expansion Heart: RRR, normal S1, S2, no murmurs Lungs: CTA bilaterally, no wheezes, rhonchi, or rales Abdomen: +bs, soft, tender generalized lower abdomen, otherwise non tender, non distended, no masses, no hepatomegaly, no splenomegaly, no bruits Back: non tender, normal ROM, no scoliosis Musculoskeletal: upper extremities non tender, no obvious deformity, normal ROM throughout, lower extremities non tender, no obvious deformity, normal ROM throughout Extremities: no edema, no cyanosis, no clubbing Pulses: 2+ symmetric, upper and lower extremities, normal cap refill Neurological: alert, oriented x 3, CN2-12 intact, strength normal upper extremities and lower extremities, sensation normal throughout, DTRs 2+ throughout, no cerebellar signs, gait normal Psychiatric: normal affect, behavior normal, pleasant  Breast/gyn/rectal - deferred to gynecology   Assessment and Plan :    Encounter Diagnoses  Name Primary?  . Routine general medical examination at a health care facility Yes  . Obesity, unspecified   . Essential hypertension, benign   . Need for prophylactic vaccination and inoculation against influenza   . Need for Tdap vaccination   . Mixed incontinence   . IUD (intrauterine device) in place   . Pelvic pain   . Screening for breast cancer     Physical exam  - discussed healthy lifestyle, diet, exercise, preventative care, vaccinations, and addressed their concerns.  Handout given.  See eye doctor and dentist regularly. she has lots of plaque, lower teeth, advise she go back for dental hygiene visit.  Obesity - advised weight loss through lifestyle changes  HTN - begin Toprol to control BP and help with headaches that are somewhat frequent.  Work on low salt diet, lifestyle changes.   Counseled on the influenza virus vaccine.  Vaccine information sheet given.  Influenza vaccine given after consent obtained.  Counseled on the Tdap (tetanus, diptheria, and acellular pertussis) vaccine.  Vaccine information sheet given. Tdap vaccine given after consent obtained.  Mixed incontinence , IUD in place that is 46 years old, needs pap, pelvic pain - referral to gynecology for multiple issues.  Referral for first screening mammogram.  Follow-up pending labs.

## 2012-11-04 ENCOUNTER — Telehealth: Payer: Self-pay | Admitting: Family Medicine

## 2012-11-04 ENCOUNTER — Other Ambulatory Visit: Payer: Self-pay | Admitting: Medical

## 2012-11-04 LAB — VITAMIN D 25 HYDROXY (VIT D DEFICIENCY, FRACTURES): Vit D, 25-Hydroxy: 21 ng/mL — ABNORMAL LOW (ref 30–89)

## 2012-11-04 MED ORDER — ATENOLOL 25 MG PO TABS: 25.0000 mg | ORAL_TABLET | Freq: Every day | ORAL | Status: DC

## 2012-11-04 NOTE — Telephone Encounter (Signed)
Atenolol sent instead.  Call back if this is expensive. This is a once a day medication as well.

## 2012-11-04 NOTE — Telephone Encounter (Signed)
Message copied by Janeice Robinson on Wed Nov 04, 2012  4:39 PM ------      Message from: Jac Canavan      Created: Tue Nov 03, 2012  8:59 AM       Refer to gynecology for pelvic pain, pap smear, incontinence, IUD is 46 years old.   ------

## 2012-11-04 NOTE — Telephone Encounter (Signed)
This should have Sharon Bond this is a clinical issue, forwarding to [place]

## 2012-11-04 NOTE — Telephone Encounter (Signed)
Patient is aware of her appointment at [facility] on 11/19/12 @ 815 am with Dr. Ernestina Penna. CLS

## 2012-11-04 NOTE — Telephone Encounter (Signed)
Pt called and states Toprol was too expensive and you said for her to let you know.  Please call in something cheaper to [facility] on [address].   Pt ph [phone]

## 2012-11-04 NOTE — Progress Notes (Signed)
Pt called advised of lab result notes and recheck here in 1 month.  Advised her of new rx called in and appt with OBGYN.

## 2012-11-04 NOTE — Telephone Encounter (Signed)
LMOM TO CB. CLS 

## 2012-11-11 LAB — HM MAMMOGRAPHY: HM Mammogram: NEGATIVE

## 2012-11-12 ENCOUNTER — Encounter: Payer: Self-pay | Admitting: Internal Medicine

## 2013-01-04 ENCOUNTER — Encounter (HOSPITAL_COMMUNITY): Payer: Self-pay

## 2013-01-04 ENCOUNTER — Inpatient Hospital Stay (HOSPITAL_COMMUNITY)
Admission: AD | Admit: 2013-01-04 | Discharge: 2013-01-05 | Disposition: A | Discharge status: Home / Self Care | Payer: BC Managed Care – PPO | Source: Ambulatory Visit | Attending: Obstetrics | Admitting: Obstetrics

## 2013-01-04 ENCOUNTER — Inpatient Hospital Stay (HOSPITAL_COMMUNITY): Payer: BC Managed Care – PPO

## 2013-01-04 DIAGNOSIS — A499 Bacterial infection, unspecified: Secondary | ICD-10-CM | POA: Insufficient documentation

## 2013-01-04 DIAGNOSIS — R1011 Right upper quadrant pain: Secondary | ICD-10-CM | POA: Insufficient documentation

## 2013-01-04 DIAGNOSIS — N760 Acute vaginitis: Secondary | ICD-10-CM | POA: Insufficient documentation

## 2013-01-04 DIAGNOSIS — Z880 Allergy status to penicillin: Secondary | ICD-10-CM | POA: Insufficient documentation

## 2013-01-04 DIAGNOSIS — N949 Unspecified condition associated with female genital organs and menstrual cycle: Secondary | ICD-10-CM | POA: Insufficient documentation

## 2013-01-04 DIAGNOSIS — R1032 Left lower quadrant pain: Secondary | ICD-10-CM | POA: Insufficient documentation

## 2013-01-04 DIAGNOSIS — B9689 Other specified bacterial agents as the cause of diseases classified elsewhere: Secondary | ICD-10-CM | POA: Insufficient documentation

## 2013-01-04 LAB — CBC
HCT: 39.5 % (ref 36.0–46.0)
Hemoglobin: 13.2 g/dL (ref 12.0–15.0)
MCH: 29 pg (ref 26.0–34.0)
MCHC: 33.4 g/dL (ref 30.0–36.0)
MCV: 86.8 fL (ref 78.0–100.0)
Platelets: 249 10*3/uL (ref 150–400)
RBC: 4.55 MIL/uL (ref 3.87–5.11)
RDW: 13.1 % (ref 11.5–15.5)
WBC: 7.7 10*3/uL (ref 4.0–10.5)

## 2013-01-04 LAB — URINALYSIS, ROUTINE W REFLEX MICROSCOPIC
Bilirubin Urine: NEGATIVE
Glucose, UA: NEGATIVE mg/dL
Ketones, ur: NEGATIVE mg/dL
Nitrite: NEGATIVE
Protein, ur: NEGATIVE mg/dL
Specific Gravity, Urine: 1.02 (ref 1.005–1.030)
Urobilinogen, UA: 0.2 mg/dL (ref 0.0–1.0)
pH: 6.5 (ref 5.0–8.0)

## 2013-01-04 LAB — COMPREHENSIVE METABOLIC PANEL
ALT: 9 U/L (ref 0–35)
AST: 11 U/L (ref 0–37)
Albumin: 3.4 g/dL — ABNORMAL LOW (ref 3.5–5.2)
Alkaline Phosphatase: 91 U/L (ref 39–117)
BUN: 8 mg/dL (ref 6–23)
CO2: 24 mEq/L (ref 19–32)
Calcium: 9.2 mg/dL (ref 8.4–10.5)
Chloride: 106 mEq/L (ref 96–112)
Creatinine, Ser: 0.61 mg/dL (ref 0.50–1.10)
GFR calc Af Amer: >90 mL/min (ref >90)
GFR calc non Af Amer: >90 mL/min (ref >90)
Glucose, Bld: 105 mg/dL — ABNORMAL HIGH (ref 70–99)
Potassium: 3.3 mEq/L — ABNORMAL LOW (ref 3.5–5.1)
Sodium: 140 mEq/L (ref 135–145)
Total Bilirubin: 0.1 mg/dL — ABNORMAL LOW (ref 0.3–1.2)
Total Protein: 7.2 g/dL (ref 6.0–8.3)

## 2013-01-04 LAB — URINE MICROSCOPIC-ADD ON

## 2013-01-04 LAB — POCT PREGNANCY, URINE: Preg Test, Ur: NEGATIVE

## 2013-01-04 MED ORDER — HYDROMORPHONE HCL PF 1 MG/ML IJ SOLN
2.0000 mg | INTRAMUSCULAR | Status: DC | PRN
  Administered 2013-01-04: 2 mg via SUBCUTANEOUS
  Filled 2013-01-04: qty 2

## 2013-01-04 NOTE — H&P (Addendum)
Chief complaint: Pelvic pain  History of present illness: 47 year old G4 P3 013 who presents with worsening pelvic pain. Patient with initial complaints of right lower quadrant pain at the end of October. She was seen in the office and at that time gonorrhea and Chlamydia were negative, urine culture was negative although numerous white blood cells were seen on the wet prep. She was treated for presumptive cervicitis with doxycycline. Pain continued and in early November patient had an ultrasound which showed no ovarian masses, small free fluid, uterus 10 x 7 x 6 cm with a volume of 212 cc. An IUD was noted in the lower segment with an arm possibly extending into the myometrium. Continued expectant management was done at that time. Patient reported again in early December, December 4 and given the continued pelvic pain the IUD was removed. The patient now notes that IUD removal has not really resolved her pain. Patient did receive Depo-Provera on the day the IUD was removed. She has not been section active since that time.   Patient mostly complains of pain in the right upper quadrant with some radiation of the pain to the left lower quadrant. Patient states at times she feels like it is difficult to catch her breath do to the right upper quadrant pain. When asked about her pain she mostly points to the right upper quadrant. No vaginal bleeding since the IUD was removed and patient denies any vaginal discharge. Patient denies nausea and vomiting although does admit her appetite has been somewhat decreased. Patient reports bowel movements twice daily although she does have a history of constipation for which he occasionally needs an enema  Allergies: Penicillin Past medical history: Mixed incontinence, hypertension, obesity Medications: Depo-Provera, metoprolol  Physical exam Filed Vitals:   01/04/13 1907  BP: 156/91  Pulse: 79  Temp: 99.2 F (37.3 C)  TempSrc: Oral  Resp: 18  Height: 5\' 2"  (1.575  m)  Weight: 95.709 kg (211 lb)  SpO2: 100%   General: Well-appearing at rest, in no distress, uncomfortable with movement Cardiovascular: Regular rate and rhythm Pulmonary: Clear to auscultation bilaterally Abdomen: Right upper quadrant tenderness present, no rebound, no guarding, abdomen obese and exam for masses is limited Back: No costovertebral angle tenderness GU: Normal external genitalia, normal labia, normal vagina, normal mons, moderate white and thin vaginal discharge, no bladder wall tenderness, patient is tender on manipulation of the cervix, no fundal tenderness, no adnexal masses though exam limited Lymphatics: No inguinal adenopathy Lower extremities: No lower chimney edema  Wet prep: Clue cells present, moderate lactobacilli, no trichomoniasis, no yeast, numerous WBCs 20-30 per high power field  UPT negative CBC, CMP, urinalysis: All reviewed, see labs, normal with the exception of low potassium  Assessment and plan: 47 year-old non-pregnant G4 P3 currently contraception with Depo-Provera and right upper quadrant and left lower quadrant pain associated with leukorrhea. - Leukorrhea, bacterial vaginosis. Will likely treat for possible PID and left other etiology becomes apparent. Will check pelvic ultrasound Right upper quadrant pain. Normal CMP, amylase and lipase are pending check ultrasound to evaluate for gallstones  Makynli Stills A. 01/04/2013 10:35 PM    Pt received 2mg  SQ Dilaudid w/ relief of pain, pt now sleepy.  RUQ normal Pelvic u/s. 2.5 cm thick EMS, uterus 11 cm, nl adenxa, no FF  A/P: no evidence cholecystitis, cholelithiasis. Uterine distension of unclear etiology explains RUQ and LLQ pain. Possible degenerating fibroid. Given WBC on wet prep and tenderness, will treat for outpt PID, low suspicious gonorrhea,  PCN allergy- plan Levofloxacin and Flagyl. Will work pt into office tomorrow for SIS and embx to further eval uterine mass.   Vicodin for pain  control.  Asheton Scheffler A. 01/05/2013 12:48 AM

## 2013-01-04 NOTE — MAU Note (Signed)
Pt states here for pain on right side of lower abdomen. Feels like a ball is ripping in her side. Also having sharp pelvic pain into her vagina. Feels like a really bad cramp. Had IUD removed 2 weeks ago (12/24/2012) thinking that was what her pain was coming from. Denies abnormal vaginal discharge.

## 2013-01-05 LAB — LIPASE, BLOOD: Lipase: 62 U/L — ABNORMAL HIGH (ref 11–59)

## 2013-01-05 LAB — AMYLASE: Amylase: 76 U/L (ref 0–105)

## 2013-01-05 LAB — GC/CHLAMYDIA PROBE AMP
CT Probe RNA: NEGATIVE
GC Probe RNA: NEGATIVE

## 2013-01-05 MED ORDER — LEVOFLOXACIN 500 MG PO TABS: 500.0000 mg | ORAL_TABLET | Freq: Every day | ORAL | Status: DC

## 2013-01-05 MED ORDER — HYDROCODONE-ACETAMINOPHEN 10-325 MG PO TABS: 1.0000 | ORAL_TABLET | Freq: Four times a day (QID) | ORAL | Status: DC | PRN

## 2013-01-05 MED ORDER — METRONIDAZOLE 500 MG PO TABS: 500.0000 mg | ORAL_TABLET | Freq: Two times a day (BID) | ORAL | Status: DC

## 2013-01-06 ENCOUNTER — Encounter (HOSPITAL_COMMUNITY): Payer: Self-pay | Admitting: Pharmacist

## 2013-01-06 ENCOUNTER — Other Ambulatory Visit: Payer: Self-pay | Admitting: Obstetrics

## 2013-01-06 LAB — URINE CULTURE
Colony Count: NO GROWTH
Culture: NO GROWTH

## 2013-01-07 ENCOUNTER — Encounter (HOSPITAL_COMMUNITY): Payer: Self-pay

## 2013-01-07 ENCOUNTER — Other Ambulatory Visit: Payer: Self-pay

## 2013-01-07 ENCOUNTER — Encounter (HOSPITAL_COMMUNITY)
Admission: RE | Admit: 2013-01-07 | Discharge: 2013-01-07 | Disposition: A | Discharge status: Home / Self Care | Payer: BC Managed Care – PPO | Source: Ambulatory Visit | Attending: Obstetrics | Admitting: Obstetrics

## 2013-01-07 LAB — CBC
HCT: 38.3 % (ref 36.0–46.0)
Hemoglobin: 13 g/dL (ref 12.0–15.0)
MCH: 29.5 pg (ref 26.0–34.0)
MCHC: 33.9 g/dL (ref 30.0–36.0)
MCV: 86.8 fL (ref 78.0–100.0)
Platelets: 224 10*3/uL (ref 150–400)
RBC: 4.41 MIL/uL (ref 3.87–5.11)
RDW: 12.9 % (ref 11.5–15.5)
WBC: 6.6 10*3/uL (ref 4.0–10.5)

## 2013-01-07 LAB — BASIC METABOLIC PANEL
BUN: 9 mg/dL (ref 6–23)
CO2: 24 mEq/L (ref 19–32)
Calcium: 9.3 mg/dL (ref 8.4–10.5)
Chloride: 107 mEq/L (ref 96–112)
Creatinine, Ser: 0.7 mg/dL (ref 0.50–1.10)
GFR calc Af Amer: >90 mL/min (ref >90)
GFR calc non Af Amer: >90 mL/min (ref >90)
Glucose, Bld: 83 mg/dL (ref 70–99)
Potassium: 3.7 mEq/L (ref 3.5–5.1)
Sodium: 141 mEq/L (ref 135–145)

## 2013-01-07 LAB — ABO/RH: ABO/RH(D): B POS

## 2013-01-07 LAB — TYPE AND SCREEN
ABO/RH(D): B POS
Antibody Screen: NEGATIVE

## 2013-01-07 MED ORDER — DOXYCYCLINE HYCLATE 100 MG IV SOLR
100.0000 mg | INTRAVENOUS | Status: AC
  Administered 2013-01-08: 100 mg via INTRAVENOUS
  Filled 2013-01-07: qty 100

## 2013-01-07 MED ORDER — DOXYCYCLINE HYCLATE 100 MG IV SOLR
100.0000 mg | Freq: Two times a day (BID) | INTRAVENOUS | Status: DC
  Filled 2013-01-07 (×4): qty 100

## 2013-01-07 NOTE — Patient Instructions (Signed)
21 Sharon Bond  01/07/2013   Your procedure is scheduled on:  01/08/13  Enter through the Main Entrance of [facility] at 2:30PM/ AM.  Pick up the phone at the desk and dial [phone].   Call this number if you have problems the morning of surgery: [phone]   Remember:   Do not eat food:After Midnight.  Do not drink clear liquids: 4 Hours before arrival.  Take these medicines the morning of surgery with A SIP OF WATER: Blood pressure medication   Do not wear jewelry, make-up or nail polish.  Do not wear lotions, powders, or perfumes. You may wear deodorant.  Do not shave 48 hours prior to surgery.  Do not bring valuables to the hospital.  [facility] is not   responsible for any belongings or valuables brought to the hospital.  Contacts, dentures or bridgework may not be worn into surgery.  Leave suitcase in the car. After surgery it may be brought to your room.  For patients admitted to the hospital, checkout time is 11:00 AM the day of              discharge.   Patients discharged the day of surgery will not be allowed to drive             home.  Name and phone number of your driver: NA  Special Instructions:   Shower using CHG 2 nights before surgery and the night before surgery.  If you shower the day of surgery use CHG.  Use special wash - you have one bottle of CHG for all showers.  You should use approximately 1/3 of the bottle for each shower.   Please read over the following fact sheets that you were given:   Surgical Site Infection Prevention

## 2013-01-07 NOTE — Pre-Procedure Instructions (Signed)
EKG reviewed by Dr Cristela Blue. No orders given.

## 2013-01-08 ENCOUNTER — Ambulatory Visit (HOSPITAL_COMMUNITY): Payer: BC Managed Care – PPO | Admitting: Anesthesiology

## 2013-01-08 ENCOUNTER — Encounter (HOSPITAL_COMMUNITY): Payer: BC Managed Care – PPO | Admitting: Anesthesiology

## 2013-01-08 ENCOUNTER — Encounter (HOSPITAL_COMMUNITY)
Admission: RE | Disposition: A | Discharge status: Home / Self Care | Payer: Self-pay | Source: Ambulatory Visit | Attending: Obstetrics

## 2013-01-08 ENCOUNTER — Ambulatory Visit (HOSPITAL_COMMUNITY)
Admission: RE | Admit: 2013-01-08 | Discharge: 2013-01-08 | Disposition: A | Discharge status: Home / Self Care | Payer: BC Managed Care – PPO | Source: Ambulatory Visit | Attending: Obstetrics | Admitting: Obstetrics

## 2013-01-08 DIAGNOSIS — R9389 Abnormal findings on diagnostic imaging of other specified body structures: Secondary | ICD-10-CM | POA: Insufficient documentation

## 2013-01-08 DIAGNOSIS — Z302 Encounter for sterilization: Secondary | ICD-10-CM | POA: Insufficient documentation

## 2013-01-08 DIAGNOSIS — N949 Unspecified condition associated with female genital organs and menstrual cycle: Secondary | ICD-10-CM | POA: Insufficient documentation

## 2013-01-08 DIAGNOSIS — N840 Polyp of corpus uteri: Secondary | ICD-10-CM | POA: Insufficient documentation

## 2013-01-08 HISTORY — PX: LAPAROSCOPIC TUBAL LIGATION: SHX1937

## 2013-01-08 HISTORY — PX: DILATATION & CURETTAGE/HYSTEROSCOPY WITH TRUECLEAR: SHX6353

## 2013-01-08 LAB — PREGNANCY, URINE: Preg Test, Ur: NEGATIVE

## 2013-01-08 SURGERY — DILATATION & CURETTAGE/HYSTEROSCOPY WITH TRUCLEAR
Anesthesia: General

## 2013-01-08 MED ORDER — PROPOFOL 10 MG/ML IV EMUL
INTRAVENOUS | Status: AC
  Filled 2013-01-08: qty 20

## 2013-01-08 MED ORDER — PROPOFOL 10 MG/ML IV BOLUS
INTRAVENOUS | Status: DC | PRN
  Administered 2013-01-08: 180 mg via INTRAVENOUS

## 2013-01-08 MED ORDER — MIDAZOLAM HCL 2 MG/2ML IJ SOLN
INTRAMUSCULAR | Status: AC
  Filled 2013-01-08: qty 2

## 2013-01-08 MED ORDER — LIDOCAINE HCL (CARDIAC) 20 MG/ML IV SOLN
INTRAVENOUS | Status: DC | PRN
  Administered 2013-01-08: 50 mg via INTRAVENOUS

## 2013-01-08 MED ORDER — FENTANYL CITRATE 0.05 MG/ML IJ SOLN
INTRAMUSCULAR | Status: DC | PRN
  Administered 2013-01-08: 50 ug via INTRAVENOUS
  Administered 2013-01-08 (×2): 100 ug via INTRAVENOUS

## 2013-01-08 MED ORDER — ONDANSETRON HCL 4 MG/2ML IJ SOLN
INTRAMUSCULAR | Status: AC
  Filled 2013-01-08: qty 2

## 2013-01-08 MED ORDER — FENTANYL CITRATE 0.05 MG/ML IJ SOLN
INTRAMUSCULAR | Status: AC
  Filled 2013-01-08: qty 5

## 2013-01-08 MED ORDER — SODIUM CHLORIDE 0.9 % IR SOLN
Status: DC | PRN
  Administered 2013-01-08: 9000 mL

## 2013-01-08 MED ORDER — VASOPRESSIN 20 UNIT/ML IJ SOLN
INTRAMUSCULAR | Status: DC | PRN
  Administered 2013-01-08: 7 [IU]

## 2013-01-08 MED ORDER — SODIUM CHLORIDE 0.9 % IJ SOLN
INTRAMUSCULAR | Status: AC
  Filled 2013-01-08: qty 30

## 2013-01-08 MED ORDER — ONDANSETRON HCL 4 MG/2ML IJ SOLN
INTRAMUSCULAR | Status: DC | PRN
  Administered 2013-01-08: 4 mg via INTRAVENOUS

## 2013-01-08 MED ORDER — PHENYLEPHRINE HCL 10 MG/ML IJ SOLN
INTRAMUSCULAR | Status: DC | PRN
  Administered 2013-01-08 (×2): 80 ug via INTRAVENOUS

## 2013-01-08 MED ORDER — LIDOCAINE HCL (CARDIAC) 20 MG/ML IV SOLN
INTRAVENOUS | Status: AC
  Filled 2013-01-08: qty 5

## 2013-01-08 MED ORDER — LACTATED RINGERS IV SOLN
INTRAVENOUS | Status: DC
  Administered 2013-01-08: 125 mL/h via INTRAVENOUS
  Administered 2013-01-08: 16:00:00 via INTRAVENOUS

## 2013-01-08 MED ORDER — KETOROLAC TROMETHAMINE 30 MG/ML IJ SOLN
30.0000 mg | Freq: Once | INTRAMUSCULAR | Status: AC
  Administered 2013-01-08: 30 mg via INTRAVENOUS

## 2013-01-08 MED ORDER — ROCURONIUM BROMIDE 100 MG/10ML IV SOLN
INTRAVENOUS | Status: AC
  Filled 2013-01-08: qty 1

## 2013-01-08 MED ORDER — BUPIVACAINE HCL (PF) 0.5 % IJ SOLN
INTRAMUSCULAR | Status: AC
  Filled 2013-01-08: qty 30

## 2013-01-08 MED ORDER — PHENYLEPHRINE HCL 10 MG/ML IJ SOLN
INTRAMUSCULAR | Status: AC
  Filled 2013-01-08: qty 1

## 2013-01-08 MED ORDER — FENTANYL CITRATE 0.05 MG/ML IJ SOLN
25.0000 ug | INTRAMUSCULAR | Status: DC | PRN
  Administered 2013-01-08 (×3): 25 ug via INTRAVENOUS

## 2013-01-08 MED ORDER — SILVER NITRATE-POT NITRATE 75-25 % EX MISC
CUTANEOUS | Status: AC
  Filled 2013-01-08: qty 1

## 2013-01-08 MED ORDER — GLYCOPYRROLATE 0.2 MG/ML IJ SOLN
INTRAMUSCULAR | Status: AC
  Filled 2013-01-08: qty 1

## 2013-01-08 MED ORDER — ROCURONIUM BROMIDE 100 MG/10ML IV SOLN
INTRAVENOUS | Status: DC | PRN
  Administered 2013-01-08: 40 mg via INTRAVENOUS
  Administered 2013-01-08: 10 mg via INTRAVENOUS

## 2013-01-08 MED ORDER — FENTANYL CITRATE 0.05 MG/ML IJ SOLN
INTRAMUSCULAR | Status: AC
  Filled 2013-01-08: qty 2

## 2013-01-08 MED ORDER — VASOPRESSIN 20 UNIT/ML IJ SOLN
INTRAMUSCULAR | Status: AC
  Filled 2013-01-08: qty 1

## 2013-01-08 MED ORDER — DEXAMETHASONE SODIUM PHOSPHATE 10 MG/ML IJ SOLN
INTRAMUSCULAR | Status: DC | PRN
  Administered 2013-01-08: 10 mg via INTRAVENOUS

## 2013-01-08 MED ORDER — DEXAMETHASONE SODIUM PHOSPHATE 10 MG/ML IJ SOLN
INTRAMUSCULAR | Status: AC
  Filled 2013-01-08: qty 1

## 2013-01-08 MED ORDER — BUPIVACAINE HCL (PF) 0.25 % IJ SOLN
INTRAMUSCULAR | Status: DC | PRN
  Administered 2013-01-08: 7.5 mL

## 2013-01-08 MED ORDER — BUPIVACAINE HCL (PF) 0.25 % IJ SOLN
INTRAMUSCULAR | Status: AC
  Filled 2013-01-08: qty 30

## 2013-01-08 MED ORDER — GLYCOPYRROLATE 0.2 MG/ML IJ SOLN
INTRAMUSCULAR | Status: DC | PRN
  Administered 2013-01-08: 0.1 mg via INTRAVENOUS
  Administered 2013-01-08: .3 mg via INTRAVENOUS

## 2013-01-08 MED ORDER — BUPIVACAINE HCL 0.5 % IJ SOLN
INTRAMUSCULAR | Status: DC | PRN
  Administered 2013-01-08: 10 mL

## 2013-01-08 MED ORDER — MIDAZOLAM HCL 2 MG/2ML IJ SOLN
INTRAMUSCULAR | Status: DC | PRN
  Administered 2013-01-08: 2 mg via INTRAVENOUS

## 2013-01-08 MED ORDER — CHLOROPROCAINE HCL 1 % IJ SOLN
INTRAMUSCULAR | Status: AC
  Filled 2013-01-08: qty 30

## 2013-01-08 MED ORDER — KETOROLAC TROMETHAMINE 30 MG/ML IJ SOLN
INTRAMUSCULAR | Status: AC
  Administered 2013-01-08: 30 mg via INTRAVENOUS
  Filled 2013-01-08: qty 1

## 2013-01-08 MED ORDER — NEOSTIGMINE METHYLSULFATE 1 MG/ML IJ SOLN
INTRAMUSCULAR | Status: DC | PRN
  Administered 2013-01-08: 2 mg via INTRAVENOUS

## 2013-01-08 SURGICAL SUPPLY — 41 items
BLADE INCISOR TRUC PLUS 2.9 (ABLATOR) ×2 IMPLANT
BLADE SURG 15 STRL LF C SS BP (BLADE) ×2 IMPLANT
BLADE SURG 15 STRL SS (BLADE) ×1
CANISTERS HI-FLOW 3000CC (CANNISTER) ×6 IMPLANT
CATH ROBINSON RED A/P 16FR (CATHETERS) IMPLANT
CHLORAPREP W/TINT 26ML (MISCELLANEOUS) ×3 IMPLANT
CLOTH BEACON ORANGE TIMEOUT ST (SAFETY) ×3 IMPLANT
CONTAINER PREFILL 10% NBF 60ML (FORM) ×6 IMPLANT
DERMABOND ADVANCED (GAUZE/BANDAGES/DRESSINGS) ×1
DERMABOND ADVANCED .7 DNX12 (GAUZE/BANDAGES/DRESSINGS) ×2 IMPLANT
DRAPE HYSTEROSCOPY (DRAPE) ×3 IMPLANT
DRSG TELFA 3X8 NADH (GAUZE/BANDAGES/DRESSINGS) ×3 IMPLANT
FORCEPS CUTTING 33CM 5MM (CUTTING FORCEPS) ×3 IMPLANT
GLOVE BIO SURGEON STRL SZ 6.5 (GLOVE) ×6 IMPLANT
GLOVE BIOGEL PI IND STRL 7.0 (GLOVE) ×4 IMPLANT
GLOVE BIOGEL PI INDICATOR 7.0 (GLOVE) ×2
GOWN PREVENTION PLUS LG XLONG (DISPOSABLE) ×9 IMPLANT
GOWN STRL REIN XL XLG (GOWN DISPOSABLE) ×6 IMPLANT
INCISOR TRUC PLUS BLADE 2.9 (ABLATOR) ×3
KIT HYSTEROSCOPY TRUCLEAR (ABLATOR) IMPLANT
MANIPULATOR UTERINE 4.5 ZUMI (MISCELLANEOUS) ×3 IMPLANT
MORCELLATOR RECIP TRUCLEAR 4.0 (ABLATOR) IMPLANT
NEEDLE INSUFFLATION 120MM (ENDOMECHANICALS) ×3 IMPLANT
NEEDLE SPNL 22GX3.5 QUINCKE BK (NEEDLE) ×3 IMPLANT
NS IRRIG 1000ML POUR BTL (IV SOLUTION) ×3 IMPLANT
PACK LAPAROSCOPY BASIN (CUSTOM PROCEDURE TRAY) ×3 IMPLANT
PACK VAGINAL MINOR WOMEN LF (CUSTOM PROCEDURE TRAY) ×3 IMPLANT
PAD OB MATERNITY 4.3X12.25 (PERSONAL CARE ITEMS) ×3 IMPLANT
SET IRRIG TUBING LAPAROSCOPIC (IRRIGATION / IRRIGATOR) ×3 IMPLANT
STENT BALLN UTERINE 3CM 6FR (Stent) IMPLANT
STENT BALLN UTERINE 4CM 6FR (STENTS) IMPLANT
SUT VICRYL 0 UR6 27IN ABS (SUTURE) ×6 IMPLANT
SUT VICRYL 4-0 PS2 18IN ABS (SUTURE) ×3 IMPLANT
SYR CONTROL 10ML LL (SYRINGE) ×3 IMPLANT
SYRINGE 10CC LL (SYRINGE) ×3 IMPLANT
TOWEL OR 17X24 6PK STRL BLUE (TOWEL DISPOSABLE) ×6 IMPLANT
TROCAR XCEL NON BLADE 8MM B8LT (ENDOMECHANICALS) ×3 IMPLANT
TROCAR XCEL NON-BLD 11X100MML (ENDOMECHANICALS) ×3 IMPLANT
TROCAR XCEL NON-BLD 5MMX100MML (ENDOMECHANICALS) ×3 IMPLANT
WARMER LAPAROSCOPE (MISCELLANEOUS) ×3 IMPLANT
WATER STERILE IRR 1000ML POUR (IV SOLUTION) IMPLANT

## 2013-01-08 NOTE — H&P (Signed)
See scanned document for full H&P  In short multiparous pt with acute on subacute pelvic pain, large endometrial polyps and undesired infertility. UPT neg, UCx neg, embx neg, GC/CT neg. SIS revealed 2 large polyps.  PMH: htn, obesity  All: PCN  Meds: levofloxacin, flagyl  PE:  Filed Vitals:   01/06/13 0857 01/08/13 1442  BP:  157/84  Pulse:  67  Temp:  99.3 F (37.4 C)  TempSrc:  Oral  Resp:  20  Height: 5\' 2"  (1.575 m)   Weight: 94.348 kg (208 lb)   SpO2:  99%   Gen: no distress Abd: obese, no rebound, no guarding GU: def to OR  A/P: polypectomy, D&C, diagnostic l'scope, b/l salpingectomy.  Sharon Bond A. 01/08/2013 3:38 PM

## 2013-01-08 NOTE — Brief Op Note (Signed)
01/08/2013  5:57 PM  PATIENT:  Sharon Bond  47 y.o. female  PRE-OPERATIVE DIAGNOSIS:  PELVIC PAIN;THICKED ENDOMETRIUM, undesired fertility, endometrial polyps  POST-OPERATIVE DIAGNOSIS:  PELVIC PAIN;THICKED ENDOMETRIUM, undesired fertility,  endometrial polyps   PROCEDURE:  Procedure(s): DILATATION & CURETTAGE/HYSTEROSCOPY WITH TRUCLEAR, polypectomy (N/A) DIAGNOSTIC LAPAROSCOPY Bilateral Salpingectomy (Bilateral)  SURGEON:  Surgeon(s) and Role:    * Dalya Maselli A. Ernestina Penna, MD - Primary  PHYSICIAN ASSISTANT:   ASSISTANTS: Mickel Crow, CNM   ANESTHESIA:   general and paracervical block  EBL:  Total I/O In: 2000 [I.V.:2000] Out: 150 [Urine:100; Blood:50]  BLOOD ADMINISTERED:none  DRAINS: none   LOCAL MEDICATIONS USED:  MARCAINE    and OTHER: 30 cc- 1/2 % marcaine mixed w/ 6 units vasopressin, 20 cc used in cervico-paracervical junction; 1/4 % marcaine, 10cc used for abdominal port sites  SPECIMEN:  Source of Specimen:  endometrial polyps, endometrial currettings, bilateral fallopian tubes  DISPOSITION OF SPECIMEN:  PATHOLOGY  COUNTS:  YES  TOURNIQUET:  * No tourniquets in log *  DICTATION: .Note written in EPIC  PLAN OF CARE: Discharge to home after PACU  PATIENT DISPOSITION:  PACU - hemodynamically stable.   Delay start of Pharmacological VTE agent (>24hrs) due to surgical blood loss or risk of bleeding: yes

## 2013-01-08 NOTE — OR Nursing (Signed)
Procedure 1 ended 1653

## 2013-01-08 NOTE — Transfer of Care (Signed)
Immediate Anesthesia Transfer of Care Note  Patient: Sharon Bond  Procedure(s) Performed: Procedure(s): DILATATION & CURETTAGE/HYSTEROSCOPY WITH TRUCLEAR, polypectomy (N/A) DIAGNOSTIC LAPAROSCOPY Bilateral Salpingectomy (Bilateral)  Patient Location: PACU  Anesthesia Type:General  Level of Consciousness: awake  Airway & Oxygen Therapy: Patient Spontanous Breathing  Post-op Assessment: Report given to PACU RN  Post vital signs: stable  Filed Vitals:   01/08/13 1442  BP: 157/84  Pulse: 67  Temp: 37.4 C  Resp: 20    Complications: No apparent anesthesia complications

## 2013-01-08 NOTE — Anesthesia Preprocedure Evaluation (Addendum)
Anesthesia Evaluation  Patient identified by MRN, date of birth, ID band Patient awake    Reviewed: Allergy & Precautions, H&P , Patient's Chart, lab work & pertinent test results, reviewed documented beta blocker date and time   Airway Mallampati: II TM Distance: >3 FB Neck ROM: full    Dental no notable dental hx.    Pulmonary  breath sounds clear to auscultation  Pulmonary exam normal       Cardiovascular hypertension, On Home Beta Blockers Rhythm:regular Rate:Normal     Neuro/Psych    GI/Hepatic   Endo/Other  Morbid obesity  Renal/GU      Musculoskeletal   Abdominal   Peds  Hematology   Anesthesia Other Findings   Reproductive/Obstetrics                          Anesthesia Physical Anesthesia Plan  ASA: III  Anesthesia Plan: General   Post-op Pain Management:    Induction: Intravenous  Airway Management Planned: Oral ETT  Additional Equipment:   Intra-op Plan:   Post-operative Plan: Extubation in OR  Informed Consent: I have reviewed the patients History and Physical, chart, labs and discussed the procedure including the risks, benefits and alternatives for the proposed anesthesia with the patient or authorized representative who has indicated his/her understanding and acceptance.   Dental Advisory Given and Dental advisory given  Plan Discussed with: CRNA and Surgeon  Anesthesia Plan Comments: (  Discussed general anesthesia, including possible nausea, instrumentation of airway, sore throat,pulmonary aspiration, etc. I asked if the were any outstanding questions, or  concerns before we proceeded. )        Anesthesia Quick Evaluation

## 2013-01-08 NOTE — Anesthesia Postprocedure Evaluation (Signed)
  Anesthesia Post-op Note  Patient: Sharon Bond  Procedure(s) Performed: Procedure(s): DILATATION & CURETTAGE/HYSTEROSCOPY WITH TRUCLEAR, polypectomy (N/A) DIAGNOSTIC LAPAROSCOPY Bilateral Salpingectomy (Bilateral) Patient is awake and responsive. Pain and nausea are reasonably well controlled. Vital signs are stable and clinically acceptable. Oxygen saturation is clinically acceptable. There are no apparent anesthetic complications at this time. Patient is ready for discharge.

## 2013-01-08 NOTE — Op Note (Signed)
01/08/2013  5:57 PM  PATIENT:  Sharon Bond  47 y.o. female  PRE-OPERATIVE DIAGNOSIS:  PELVIC PAIN;THICKED ENDOMETRIUM, undesired fertility, endometrial polyps  POST-OPERATIVE DIAGNOSIS:  PELVIC PAIN;THICKED ENDOMETRIUM, undesired fertility,  endometrial polyps   PROCEDURE:  Procedure(s): DILATATION & CURETTAGE/HYSTEROSCOPY WITH TRUCLEAR, polypectomy (N/A) DIAGNOSTIC LAPAROSCOPY Bilateral Salpingectomy (Bilateral)  SURGEON:  Surgeon(s) and Role:    * Kara Mierzejewski A. Ernestina Penna, MD - Primary  PHYSICIAN ASSISTANT:   ASSISTANTS: Mickel Crow, CNM   ANESTHESIA:   general and paracervical block  EBL:  Total I/O In: 2000 [I.V.:2000] Out: 150 [Urine:100; Blood:50]  BLOOD ADMINISTERED:none  DRAINS: none   LOCAL MEDICATIONS USED:  MARCAINE    and OTHER: 30 cc- 1/2 % marcaine mixed w/ 6 units vasopressin, 20 cc used in cervico-paracervical junction; 1/4 % marcaine, 10cc used for abdominal port sites  SPECIMEN:  Source of Specimen:  endometrial polyps, endometrial currettings, bilateral fallopian tubes  DISPOSITION OF SPECIMEN:  PATHOLOGY  COUNTS:  YES  TOURNIQUET:  * No tourniquets in log *  DICTATION: .Note written in EPIC  PLAN OF CARE: Discharge to home after PACU  PATIENT DISPOSITION:  PACU - hemodynamically stable.   Delay start of Pharmacological VTE agent (>24hrs) due to surgical blood loss or risk of bleeding: yes   Abx: 100mg  IV doxycycline  Findings: multiple endometrial polyps and thickened endometrium,  visualization of bilateral ostia post-procedure, hemostasis post-procedure; laparascopic findings: nl uterus, nl tubes and ovaries, hemostatic mesosalpynx post salpingectomy, nl liver edge, nl gallbladder, non-visualization of appendix. No etiology for pelvic pain.   Indications: pelvic pain, endometrial polyps and thickening, undesired fertility.    After informed consent including discussion of risks of bleeding, infection, perforation,  the patient was taken  to the operating room where general anesthesia was initiated without difficulty. She was prepped and draped in normal sterile fashion in the dorsal supine lithotomy position.  A bimanual examination was done to assess the size and position of the uterus. A speculum was placed in the vagina and single tooth tenaculum used to grasp the anterior lip of the cervix. Local anaesthetic with vasopressin was injected at 5 and 7 o'clock in there cervico-paracervical junction.   The cervix was then serially dilated to a #19 Pratt dilator. The hysteroscope was placed into the cervix and past the internal os. Much difficulty was had during the hysteroscopic portion of the case to obtain clear visualization of the pathology. Manipulation of inflow, outflow and tenaculum on cervix to obtain proper uterine distension. The TruClear blade was then placed through the operating channel, suction was applied and the polyps were serially grasped with the blade and resected. This continued until polyps and thickening was resected. A visual curretage was done on any areas with thicker polypoid tissue.  The remainder of the uterus appeared normal. Hemostasis was noted. Given the amount of polypoid tissue, hysteroscopic portion of the case lasted about 1 hr. Fluid deficit of 300 cc was watched closely through the case.   The hysteroscope was then removed. A Zumi uterine manipulator was then placed for the laprascopic portion of the case. Tenaculum was removed. The tenaculum site was hemostatic   Attention was then turned to the patient's abdomen where a 10-mm skin incision was made on the umbilical fold after first injecting with marcaine.  The Veress needle was carefully introduced into the peritoneal cavity through the abdominal wall.  Intraperitoneal placement was confirmed with a saline filled syringe and by drop in intraabdominal pressure with insufflation of carbon dioxide  gas.  Adequate pneumoperitoneum was obtained, and the  10-mm non-bladed optiview trocar and sleeve were then advanced without difficulty into the abdomen where intraabdominal placement was confirmed by the operative laparoscope. A survey of the patient's pelvis and abdomen revealed findings as above.  5 mm skin incisions were made in the left and right lower quadrant after injecting with anasthetic. Non-bladed trocars were placed under direct visualization. Using blunt and grasping instruments, anatomy was surveyed with findings as above.  No clear etiology of pt's pelvic pain was seen. Of note, attempt was made to visualize the appendix but it was not seen.   Bilateral ureters were seen peristalsis in the pelvis. The right tube was placed on stretch and the tube was serially transected from the mesosalpynx. It was placed in the anterior cul-de-sac. The left tube was placed on tension and serially transected from the mesosalpynx. Good hemostasis was noted. A 5mm camera was placed in the LLQ port and grasping instruments used to remove the fallopian tubes.   Nezhat suction irrigator used, no active bleeding, pelvis irrigated and all fluid then removed.   Pneumoperitoneum removed, sites still hemostatic. Instruments and ports removed.   Umbilical fascial incision not needing closure due to small size with non-bladed port. 4-0 vicryl used for skin incisions. Dermabond applied by nurse.   Zumi removed, Catheter removed.   The patient tolerated the procedure well.  Sponge, lap, and needle counts were correct times two.  The patient was then taken to the recovery room awake, extubated and  in stable condition.  Emmalyn Hinson A. 01/05/2013 12:19 PM

## 2013-01-11 ENCOUNTER — Encounter (HOSPITAL_COMMUNITY): Payer: Self-pay | Admitting: Obstetrics

## 2013-04-01 ENCOUNTER — Other Ambulatory Visit: Payer: Self-pay | Admitting: Medical

## 2013-04-02 ENCOUNTER — Other Ambulatory Visit: Payer: Self-pay | Admitting: Medical

## 2013-04-15 ENCOUNTER — Encounter: Payer: Self-pay | Admitting: Medical

## 2013-04-15 ENCOUNTER — Ambulatory Visit
Admission: RE | Admit: 2013-04-15 | Discharge: 2013-04-15 | Disposition: A | Discharge status: Home / Self Care | Payer: BC Managed Care – PPO | Source: Ambulatory Visit | Attending: Medical | Admitting: Medical

## 2013-04-15 ENCOUNTER — Ambulatory Visit (INDEPENDENT_AMBULATORY_CARE_PROVIDER_SITE_OTHER): Payer: BC Managed Care – PPO | Admitting: Medical

## 2013-04-15 VITALS — BP 134/82 | HR 80 | Temp 98.3°F | Resp 24 | Wt 211.0 lb

## 2013-04-15 DIAGNOSIS — R071 Chest pain on breathing: Secondary | ICD-10-CM

## 2013-04-15 DIAGNOSIS — R0600 Dyspnea, unspecified: Secondary | ICD-10-CM

## 2013-04-15 DIAGNOSIS — R0789 Other chest pain: Secondary | ICD-10-CM

## 2013-04-15 DIAGNOSIS — R079 Chest pain, unspecified: Secondary | ICD-10-CM

## 2013-04-15 DIAGNOSIS — R0989 Other specified symptoms and signs involving the circulatory and respiratory systems: Secondary | ICD-10-CM

## 2013-04-15 DIAGNOSIS — R0609 Other forms of dyspnea: Secondary | ICD-10-CM

## 2013-04-15 LAB — CBC WITH DIFFERENTIAL/PLATELET
Basophils Absolute: 0 10*3/uL (ref 0.0–0.1)
Basophils Relative: 0 % (ref 0–1)
Eosinophils Absolute: 0.2 10*3/uL (ref 0.0–0.7)
Eosinophils Relative: 3 % (ref 0–5)
HCT: 39.5 % (ref 36.0–46.0)
Hemoglobin: 13.5 g/dL (ref 12.0–15.0)
Lymphocytes Relative: 32 % (ref 12–46)
Lymphs Abs: 2.2 10*3/uL (ref 0.7–4.0)
MCH: 29.2 pg (ref 26.0–34.0)
MCHC: 34.2 g/dL (ref 30.0–36.0)
MCV: 85.5 fL (ref 78.0–100.0)
Monocytes Absolute: 0.3 10*3/uL (ref 0.1–1.0)
Monocytes Relative: 5 % (ref 3–12)
Neutro Abs: 4.1 10*3/uL (ref 1.7–7.7)
Neutrophils Relative %: 60 % (ref 43–77)
Platelets: 238 10*3/uL (ref 150–400)
RBC: 4.62 MIL/uL (ref 3.87–5.11)
RDW: 14 % (ref 11.5–15.5)
WBC: 6.8 10*3/uL (ref 4.0–10.5)

## 2013-04-15 LAB — D-DIMER, QUANTITATIVE: D-Dimer, Quant: 0.39 ug/mL-FEU (ref 0.00–0.48)

## 2013-04-15 LAB — SEDIMENTATION RATE: Sed Rate: 12 mm/hr (ref 0–22)

## 2013-04-15 NOTE — Progress Notes (Signed)
   Subjective:   Sharon Bond is a 48 y.o. female presenting on 04/15/2013 with chest discomfort  For a few weeks having a pain in sternum, tender between breasts. No recent trauma, fall, or injury to chest.  No recent strenuous activity.   Feels a little trouble breathing due to the chest wall pain.  When lying and getting out of bed, feels like things in chest falling in.  No cough.  No wheezing, no vomiting, no fever.  Using some advil with some relief.  No calve pain, no prior PE/DVT, no lumps in breast, no swollen legs.  Recently had gynecology surgery 01/08/13 for mass in uterus.  No other aggravating or relieving factors.  No other complaint.  Review of Systems ROS as in subjective      Objective:     Filed Vitals:   04/15/13 0823  BP: 134/82  Pulse: 80  Temp: 98.3 F (36.8 C)  Resp: 16    General appearance: alert, no distress, WD/WN, in pain with moving or lying down Oral cavity: MMM, no lesions Neck: supple, no lymphadenopathy, no thyromegaly, no masses Heart: RRR, normal S1, S2, no murmurs Chest: very tender over sternum  Lungs: decreased breath sounds in general, seems to be hesitant to take deep breaths, otherwise, no wheezes, rhonchi, or rales Abdomen: +bs, soft, non tender, non distended, no masses, no hepatomegaly, no splenomegaly Pulses: 2+ symmetric, upper and lower extremities, normal cap refill Ext: no edema, no palpable cord, -Homans. Breast: No obvious lump, mass, no skin changes, no nipple drainage, no lymphadenopathy, no obvious worrisome finding, exam chaperoned by nurse    Adult ECG Report  Indication: chest pain, dyspnea, chest wall pain  Rate: 81bpm  Rhythm: normal sinus rhythm  QRS Axis: 13 degrees  PR Interval: 149ms  QRS Duration: 60ms  QTc: 434ms  Conduction Disturbances: none  Other Abnormalities: left atrial enlargement  Patient's cardiac risk factors are: hypertension and obesity (BMI >= 30 kg/m2).  EKG comparison: 12/2012  Narrative Interpretation: no acute changes         Assessment: Encounter Diagnoses  Name Primary?  . Chest pain Yes  . Chest wall pain   . Dyspnea      Plan: Etiology unclear, may just be sternal inflammation, but given dyspnea, recent surgery, chest discomfort in general, we'll check chest x-ray, some labs to help further evaluate   Cleda was seen today for chest discomfort.  Diagnoses and associated orders for this visit:  Chest pain - EKG 12-Lead - PR ELECTROCARDIOGRAM, COMPLETE - DG Chest 2 View; Future - CBC with Differential - Sedimentation rate - Pulse oximetry (single); Future  Chest wall pain - EKG 12-Lead - PR ELECTROCARDIOGRAM, COMPLETE - DG Chest 2 View; Future - CBC with Differential - Sedimentation rate - Pulse oximetry (single); Future  Dyspnea - EKG 12-Lead - PR ELECTROCARDIOGRAM, COMPLETE - DG Chest 2 View; Future - CBC with Differential - Sedimentation rate - Pulse oximetry (single); Future - D-dimer, quantitative    Return pending studies.

## 2013-04-15 NOTE — Progress Notes (Signed)
Contacted patient with results from x-ray and advised her to take ibuprofen per Shane's instructions and that we will call her when labs come back. Patient to call back if symptoms worsen.WL

## 2013-08-20 ENCOUNTER — Other Ambulatory Visit: Payer: Self-pay | Admitting: Medical

## 2013-09-06 ENCOUNTER — Ambulatory Visit: Payer: BC Managed Care – PPO | Admitting: Medical

## 2013-09-06 ENCOUNTER — Emergency Department (HOSPITAL_COMMUNITY)
Admission: EM | Admit: 2013-09-06 | Discharge: 2013-09-06 | Disposition: A | Discharge status: Home / Self Care | Payer: Commercial Indemnity | Attending: Emergency Medicine | Admitting: Emergency Medicine

## 2013-09-06 ENCOUNTER — Emergency Department (HOSPITAL_COMMUNITY): Payer: Commercial Indemnity

## 2013-09-06 ENCOUNTER — Encounter: Payer: Self-pay | Admitting: Medical

## 2013-09-06 ENCOUNTER — Encounter (HOSPITAL_COMMUNITY): Payer: Self-pay | Admitting: Emergency Medicine

## 2013-09-06 ENCOUNTER — Ambulatory Visit (INDEPENDENT_AMBULATORY_CARE_PROVIDER_SITE_OTHER): Payer: Managed Care, Other (non HMO) | Admitting: Medical

## 2013-09-06 VITALS — BP 162/102 | HR 84 | Temp 97.8°F | Resp 16 | Wt 214.0 lb

## 2013-09-06 DIAGNOSIS — Z79899 Other long term (current) drug therapy: Secondary | ICD-10-CM | POA: Insufficient documentation

## 2013-09-06 DIAGNOSIS — R0609 Other forms of dyspnea: Secondary | ICD-10-CM

## 2013-09-06 DIAGNOSIS — R0601 Orthopnea: Secondary | ICD-10-CM

## 2013-09-06 DIAGNOSIS — I10 Essential (primary) hypertension: Secondary | ICD-10-CM | POA: Diagnosis not present

## 2013-09-06 DIAGNOSIS — Z8709 Personal history of other diseases of the respiratory system: Secondary | ICD-10-CM | POA: Diagnosis not present

## 2013-09-06 DIAGNOSIS — M7989 Other specified soft tissue disorders: Secondary | ICD-10-CM | POA: Insufficient documentation

## 2013-09-06 DIAGNOSIS — R0989 Other specified symptoms and signs involving the circulatory and respiratory systems: Secondary | ICD-10-CM

## 2013-09-06 DIAGNOSIS — R609 Edema, unspecified: Secondary | ICD-10-CM | POA: Insufficient documentation

## 2013-09-06 DIAGNOSIS — Z880 Allergy status to penicillin: Secondary | ICD-10-CM | POA: Diagnosis not present

## 2013-09-06 DIAGNOSIS — R0600 Dyspnea, unspecified: Secondary | ICD-10-CM

## 2013-09-06 DIAGNOSIS — R12 Heartburn: Secondary | ICD-10-CM | POA: Diagnosis not present

## 2013-09-06 DIAGNOSIS — R079 Chest pain, unspecified: Secondary | ICD-10-CM | POA: Insufficient documentation

## 2013-09-06 DIAGNOSIS — R0789 Other chest pain: Secondary | ICD-10-CM

## 2013-09-06 DIAGNOSIS — R0602 Shortness of breath: Secondary | ICD-10-CM

## 2013-09-06 LAB — CBC WITH DIFFERENTIAL/PLATELET
Basophils Absolute: 0 10*3/uL (ref 0.0–0.1)
Basophils Relative: 0 % (ref 0–1)
Eosinophils Absolute: 0.2 10*3/uL (ref 0.0–0.7)
Eosinophils Relative: 3 % (ref 0–5)
HCT: 39.2 % (ref 36.0–46.0)
Hemoglobin: 12.8 g/dL (ref 12.0–15.0)
Lymphocytes Relative: 34 % (ref 12–46)
Lymphs Abs: 2.2 10*3/uL (ref 0.7–4.0)
MCH: 29.6 pg (ref 26.0–34.0)
MCHC: 32.7 g/dL (ref 30.0–36.0)
MCV: 90.7 fL (ref 78.0–100.0)
Monocytes Absolute: 0.3 10*3/uL (ref 0.1–1.0)
Monocytes Relative: 5 % (ref 3–12)
Neutro Abs: 3.8 10*3/uL (ref 1.7–7.7)
Neutrophils Relative %: 58 % (ref 43–77)
Platelets: 230 10*3/uL (ref 150–400)
RBC: 4.32 MIL/uL (ref 3.87–5.11)
RDW: 12.3 % (ref 11.5–15.5)
WBC: 6.6 10*3/uL (ref 4.0–10.5)

## 2013-09-06 LAB — BASIC METABOLIC PANEL
Anion gap: 11 (ref 5–15)
BUN: 7 mg/dL (ref 6–23)
CO2: 25 mEq/L (ref 19–32)
Calcium: 9.2 mg/dL (ref 8.4–10.5)
Chloride: 107 mEq/L (ref 96–112)
Creatinine, Ser: 0.64 mg/dL (ref 0.50–1.10)
GFR calc Af Amer: >90 mL/min (ref >90)
GFR calc non Af Amer: >90 mL/min (ref >90)
Glucose, Bld: 95 mg/dL (ref 70–99)
Potassium: 3.8 mEq/L (ref 3.7–5.3)
Sodium: 143 mEq/L (ref 137–147)

## 2013-09-06 LAB — I-STAT TROPONIN, ED: Troponin i, poc: 0.01 ng/mL (ref 0.00–0.08)

## 2013-09-06 LAB — D-DIMER, QUANTITATIVE: D-Dimer, Quant: 0.37 ug/mL-FEU (ref 0.00–0.48)

## 2013-09-06 LAB — PRO B NATRIURETIC PEPTIDE: Pro B Natriuretic peptide (BNP): 54.1 pg/mL (ref 0–125)

## 2013-09-06 NOTE — ED Provider Notes (Signed)
CSN: [id number]     Arrival date & time 09/06/13  1026 History   First MD Initiated Contact with Patient 09/06/13 1038     Chief Complaint  Patient presents with  . Chest Pain     (Consider location/radiation/quality/duration/timing/severity/associated sxs/prior Treatment) HPI Comments: Sharon Bond is a 48 y.o. Female with a PMHx of HTN, and allergic rhinitis, who presents to the ED with complaints of moderate, "pinching", L sided chest pain that began gradually over the last few days, nonradiating, and occurs intermittently without any known triggers. States it can occur at rest or during exertion, does not have a specific event that causes it. States that she has not tried anything to help with this pain since it goes away after a few seconds, but did notice that cold water helps sometimes. No known aggravating or alleviating factors. Associated symptoms include progressive SOB/DOE, orthopnea over the last few months, 12# wt gain in 5 mos, increased fatigue, intermittent LE swelling, and gradually increasing BP whereas she was previously well controlled on Atenolol. Denies fever, diaphoresis, HA, vision changes, cough, hemoptysis, PND, URI symptoms, back pain, abd pain, N/V/D, urinary symptoms, myalgias, arthralgias, paresthesias, or weakness. Nonsmoker. No hx of DVT, no recent travel.  Patient is a 48 y.o. female presenting with chest pain. The history is provided by the patient. No language interpreter was used.  Chest Pain Pain location:  L chest Pain quality comment:  Pinching Pain radiates to:  Does not radiate Pain radiates to the back: no   Pain severity:  Moderate Onset quality:  Gradual Duration:  3 days Timing:  Intermittent Progression:  Unable to specify (intermittent) Chronicity:  New Context: at rest   Context: not breathing and no movement   Relieved by:  None tried Worsened by:  Nothing tried Ineffective treatments:  None tried Associated symptoms: fatigue (gradual),  heartburn, lower extremity edema, orthopnea and shortness of breath (intermittent)   Associated symptoms: no abdominal pain, no back pain, no claudication, no cough, no diaphoresis, no dizziness, no fever, no headache, no nausea, no near-syncope, no numbness, no palpitations, no PND, no syncope, not vomiting and no weakness   Risk factors: hypertension and obesity     Past Medical History  Diagnosis Date  . Hypertension   . Seasonal allergic rhinitis   . Wears glasses   . IUD (intrauterine device) in place 2005  . Mixed incontinence    Past Surgical History  Procedure Laterality Date  . Wisdom tooth extraction    . No past surgeries    . Dilatation & curettage/hysteroscopy with trueclear N/A 01/08/2013    Procedure: DILATATION & CURETTAGE/HYSTEROSCOPY WITH TRUCLEAR, polypectomy;  Surgeon: Claiborne Billings A. Pamala Hurry, MD;  Location: [facility];  Service: Gynecology;  Laterality: N/A;  . Laparoscopic tubal ligation Bilateral 01/08/2013    Procedure: DIAGNOSTIC LAPAROSCOPY Bilateral Salpingectomy;  Surgeon: Claiborne Billings A. Pamala Hurry, MD;  Location: [facility];  Service: Gynecology;  Laterality: Bilateral;   Family History  Problem Relation Age of Onset  . Diabetes Mother   . Hypertension Mother   . Diabetes Brother   . Heart disease Maternal Aunt   . Cancer Neg Hx   . Stroke Neg Hx    History  Substance Use Topics  . Smoking status: Never Smoker   . Smokeless tobacco: Never Used  . Alcohol Use: Yes     Comment: occasionally   OB History   Grav Para Term Preterm Abortions TAB SAB Ect Mult Living   4 4 3  1  2     Review of Systems  Constitutional: Positive for fatigue (gradual). Negative for fever and diaphoresis.  HENT: Negative for congestion and rhinorrhea.   Eyes: Negative for visual disturbance.  Respiratory: Positive for shortness of breath (intermittent). Negative for cough and chest tightness.   Cardiovascular: Positive for chest pain (intermittent), orthopnea and leg swelling  (intermittent). Negative for palpitations, claudication, syncope, PND and near-syncope.  Gastrointestinal: Positive for heartburn. Negative for nausea, vomiting and abdominal pain.  Genitourinary: Negative for dysuria and hematuria.  Musculoskeletal: Negative for arthralgias, back pain and myalgias.  Skin: Negative for color change.  Neurological: Negative for dizziness, weakness, numbness and headaches.  Psychiatric/Behavioral: Negative for confusion.  10 Systems reviewed and are negative for acute change except as noted in the HPI.     Allergies  Penicillins  Home Medications   Prior to Admission medications   Medication Sig Start Date End Date Taking? Authorizing Provider  atenolol (TENORMIN) 25 MG tablet Take 25 mg by mouth daily.   Yes Historical Provider, MD   BP 146/79  Pulse 71  Temp(Src) 98.3 F (36.8 C) (Oral)  Resp 22  SpO2 100%  LMP 11/28/2012 Physical Exam  Nursing note and vitals reviewed. Constitutional: She is oriented to person, place, and time. Vital signs are normal. She appears well-developed and well-nourished. No distress.  VSS, NAD  HENT:  Head: Normocephalic and atraumatic.  Mouth/Throat: Mucous membranes are normal.  Eyes: Conjunctivae and EOM are normal. Pupils are equal, round, and reactive to light. Right eye exhibits no discharge. Left eye exhibits no discharge.  Neck: Normal range of motion. Neck supple. No JVD present. Carotid bruit is not present.  Cardiovascular: Normal rate, regular rhythm, normal heart sounds and intact distal pulses.  Exam reveals no gallop and no friction rub.   No murmur heard. RRR, nl s1/s2, no m/r/g, distal pulses intact in all 4 extremities, cap refill <3 secs  Pulmonary/Chest: Effort normal and breath sounds normal. No respiratory distress. She has no decreased breath sounds. She has no wheezes. She has no rhonchi. She has no rales.  CTAB in all lung fields, no w/r/r  Abdominal: Soft. Normal appearance and bowel  sounds are normal. She exhibits no distension. There is no tenderness. There is no rigidity, no rebound and no guarding.  Obese. Soft, NT, ND, +BS throughout. No r/g/r  Musculoskeletal: Normal range of motion.  Moving all extremities with ease. Strength 5/5 in all extremities. Sensation grossly intact in all extremities. Trace to 1+ pretibial pitting edema up to mid-calf bilaterally. Distal pulses intact  Neurological: She is alert and oriented to person, place, and time. She has normal strength. No sensory deficit.  Skin: Skin is warm, dry and intact. No rash noted.  No skin discolorations or changes  Psychiatric: She has a normal mood and affect.    ED Course  Procedures (including critical care time) Labs Review Labs Reviewed  BASIC METABOLIC PANEL  PRO B NATRIURETIC PEPTIDE  CBC WITH DIFFERENTIAL  D-DIMER, QUANTITATIVE  Randolm Idol, ED    Imaging Review Dg Chest 2 View  09/06/2013   CLINICAL DATA:  Chest pressure.  Chest pain.  Short of breath.  EXAM: CHEST  2 VIEW  COMPARISON:  04/15/2013.  FINDINGS: Cardiopericardial silhouette within normal limits. Mediastinal contours normal. Trachea midline. No airspace disease or effusion.  IMPRESSION: No active cardiopulmonary disease.   Electronically Signed   By: Dereck Ligas M.D.   On: 09/06/2013 12:29     EKG Interpretation  Date/Time:  Monday September 06 2013 10:33:59 EDT Ventricular Rate:  75 PR Interval:  166 QRS Duration: 80 QT Interval:  374 QTC Calculation: 417 R Axis:   25 Text Interpretation:  Normal sinus rhythm Low voltage QRS Otherwise within  normal limits No significant change since last tracing 07 Jan 2013  Confirmed by [facility]  MD-I, IVA ([id number]) on 09/06/2013 10:38:33 AM      MDM   Final diagnoses:  Atypical chest pain  HTN (hypertension), benign  Dependent edema     48y/o obese AAF with PMHx of HTN having intermittent CP and gradual onset orthopnea, SOB/DOE, slight wt gain. Exam reveals clear lung  exam, no extra heart sounds, trace pitting edema. Will obtain labs, CXR, EKG and monitor. Reassess shortly. DDx includes increased fluid/CHF, HTN urgency, PE/DVT, dependent edema, ACS.   12:49 PM CXR neg, EKG unchanged from prior, trop WNL, CBC w/diff WNL, BMP WNL, BNP WNL, d-dimer neg. Given that this pain lasts for seconds and has been ongoing for several days intermittently, doubt ACS at this time with these labs. Doubt CHF. Pt follows up closely with PCP, and I believe she would do well with HCTZ to help with some mild swelling in her LEs. Likely dependent edema. CP could be GERD given that she complained of heartburn, or could be musculoskeletal. Does not sound like unstable angina given that it does not follow a pattern with aggravating or alleviating factors. Will d/c with close f/up from PCP and cards. Discussed use of zantac for GERD symptoms and elevation of legs. Patient is to be discharged with recommendation to follow up with PCP in regards to today's hospital visit. Pts symptoms unlikely to be of CAD etiology and pt has not reported any CP while in my care. Labs and imaging reviewed again prior to dc. CXR and ECG with no acute abnormalities. Pt has been advised return to the ED if develop any exertional CP- strict return precautions discussed & patient's questions answered. Pt appears reliable for follow up and is agreeable to discharge. Stable at time of d/c.  BP 147/79  Pulse 68  Temp(Src) 98.3 F (36.8 C) (Oral)  Resp 18  SpO2 98%  LMP 11/28/2012     Patty Sermons Camprubi-Soms, PA-C 09/06/13 1302

## 2013-09-06 NOTE — ED Notes (Signed)
Pt ambulatory to restroom with steady gait.

## 2013-09-06 NOTE — ED Provider Notes (Signed)
Medical screening examination/treatment/procedure(s) were performed by non-physician practitioner and as supervising physician I was immediately available for consultation/collaboration.   EKG Interpretation   Date/Time:  Monday September 06 2013 10:33:59 EDT Ventricular Rate:  75 PR Interval:  166 QRS Duration: 80 QT Interval:  374 QTC Calculation: 417 R Axis:   25 Text Interpretation:  Normal sinus rhythm Low voltage QRS Otherwise within  normal limits No significant change since last tracing 07 Jan 2013  Confirmed by [facility]  MD-I, Elwyn Klosinski ([id number]) on 09/06/2013 10:38:33 AM      Rolland Porter, MD, Alanson Aly, MD 09/06/13 1313

## 2013-09-06 NOTE — Progress Notes (Addendum)
Subjective:    Sharon Bond is a 48 y.o. female who presents for evaluation of shortness of breath.  She presented today as a walk-in accompanied by her husband for chest pain, SOB, was notably diaphoretic at the front office counter.    She notes that she has been short of breath quite regular for the past several weeks but worse in the last 24 hours.  She has had intermittent brief chest pains lasting for seconds, but husband notes that she's been complaining of chest pains for several days at least.  Has been complaining of fatigue, blood pressures have all been elevated in recent weeks despite being compliant with atenolol 25 mg daily. She denies any increase salt intake but her weight has increased in the last month or more unexpectedly.  She does note occasional palpitations of the heart. In general she has not been exercising. She is a nonsmoker. No recent alcohol use. She notes that she sleeps inclined in general, and although this is not new, she does get short of breath if she lies flat.  She was short of breath this morning as we were doing the EKG.  Patient's cardiac risk factors are: hypertension, obesity (BMI >= 30 kg/m2) and sedentary lifestyle.  Previous cardiac testing: chest x-ray and electrocardiogram (ECG).  The following portions of the patient's history were reviewed and updated as appropriate: allergies, current medications, past family history, past medical history, past social history, past surgical history and problem list.  Review of Systems Constitutional: denies fever, chills,+ sweats, +unexpected weight change, -anorexia,+ fatigue ENT: no runny nose, ear pain, sore throat, hoarseness, sinus pain, hearing loss, epistaxis Gastroenterology: denies abdominal pain, nausea, vomiting Urology: denies dysuria, difficulty urinating, hematuria, urinary frequency, urgency, incontinence Neurology: no headache, weakness, tingling, numbness, speech abnormality, memory loss, falls,  dizziness Psychology: denies depressed mood, agitation, sleep problems   Past Medical History  Diagnosis Date  . Hypertension   . Seasonal allergic rhinitis   . Wears glasses   . IUD (intrauterine device) in place 2005  . Mixed incontinence     Objective:    BP 162/102  Pulse 84  Temp(Src) 97.8 F (36.6 C) (Oral)  Resp 16  Wt 214 lb (97.07 kg)  SpO2 99%  LMP 11/28/2012  Wt Readings from Last 3 Encounters:  09/06/13 214 lb (97.07 kg)  04/15/13 211 lb (95.709 kg)  01/06/13 208 lb (94.348 kg)    General appearance: alert, no distress, WD/WN, female  HEENT: normocephalic, conjunctiva/corneas normal, sclerae anicteric, PERRLA, EOMi, nares patent, no discharge or erythema, pharynx normal Oral cavity: MMM Neck: supple, no lymphadenopathy, no thyromegaly, no JVD Chest: non tender, normal shape and expansion Heart: RRR, normal S1, S2, no murmurs Lungs: tachypneic, othwrise CTA bilaterally, no wheezes, rhonchi, or rales Extremities: no edema, no cyanosis, no clubbing Pulses: 2+ symmetric, upper and lower extremities, normal cap refill Neurological: alert, oriented x 3, CN2-12 intact Psychiatric: normal affect, behavior normal, pleasant     Adult ECG Report  Indication: chest pain, SOB  Rate: 80 bpm  Rhythm: normal sinus rhythm  QRS Axis: 3 degrees  PR Interval: 167ms  QRS Duration: 68ms  QTc: 439ms  Conduction Disturbances: none  Other Abnormalities: T wave inversion III, possible left atrial enlargement given P wave abnormality  Patient's cardiac risk factors are: hypertension, obesity (BMI >= 30 kg/m2) and sedentary lifestyle.  EKG comparison: 03/2013   Narrative Interpretation: no major change from prior EKG    Assessment:   Encounter Diagnoses  Name  Primary?  . SOB (shortness of breath) Yes  . Chest pain, unspecified   . Orthopnea   . DOE (dyspnea on exertion)   . Essential hypertension, benign      Plan   We discussed her concerns and symptoms.   She is  a 48yo obese AA female with HTN that had been relatively controlled as of March, but lately increased BPs, worsening CP/SOB/DOE in the last few weeks, worse in the last 24 hours, +orthopnea in the office.  She had a couple different EKG readings this morning, one suggesting atrial flutter. Given her worsening shortness of breath, chest pain, cardiac source acute coronary syndrome needs to be ruled out.  Advised she be seen  in the ED, they declined EMS transport, husband will take her to the ED now.   I called the ED to advise.

## 2013-09-06 NOTE — ED Notes (Signed)
Pt returned from xray

## 2013-09-06 NOTE — ED Notes (Signed)
To ED with Chest pain and SOB from Dr. Madelyn Brunner office-- EKG done on arrival-- shown to DR. Vanita Panda--

## 2013-09-06 NOTE — Discharge Instructions (Signed)
Your chest pain could be related to indigestion, try using Zantac 150mg  daily for those symptoms. You will need to see your regular doctor in 3 days for follow up and ongoing management of your leg swelling and hypertension control. See the cardiologist for further evaluation of any heart related concerns. Return to the ER for any changes or worsening symptoms.   Chest Pain (Nonspecific) It is often hard to give a diagnosis for the cause of chest pain. There is always a chance that your pain could be related to something serious, such as a heart attack or a blood clot in the lungs. You need to follow up with your doctor. HOME CARE  If antibiotic medicine was given, take it as directed by your doctor. Finish the medicine even if you start to feel better.  For the next few days, avoid activities that bring on chest pain. Continue physical activities as told by your doctor.  Do not use any tobacco products. This includes cigarettes, chewing tobacco, and e-cigarettes.  Avoid drinking alcohol.  Only take medicine as told by your doctor.  Follow your doctor's suggestions for more testing if your chest pain does not go away.  Keep all doctor visits you made. GET HELP IF:  Your chest pain does not go away, even after treatment.  You have a rash with blisters on your chest.  You have a fever. GET HELP RIGHT AWAY IF:   You have more pain or pain that spreads to your arm, neck, jaw, back, or belly (abdomen).  You have shortness of breath.  You cough more than usual or cough up blood.  You have very bad back or belly pain.  You feel sick to your stomach (nauseous) or throw up (vomit).  You have very bad weakness.  You pass out (faint).  You have chills. This is an emergency. Do not wait to see if the problems will go away. Call your local emergency services (911 in [place] Do not drive yourself to the hospital. MAKE SURE YOU:   Understand these instructions.  Will watch your  condition.  Will get help right away if you are not doing well or get worse. Document Released: 06/26/2007 Document Revised: 01/12/2013 Document Reviewed: 06/26/2007 [facility] Patient Information 2015 [place], [place]. This information is not intended to replace advice given to you by your health care provider. Make sure you discuss any questions you have with your health care provider.  Edema Edema is an abnormal buildup of fluids. It is more common in your legs and thighs. Painless swelling of the feet and ankles is more likely as a person ages. It also is common in looser skin, like around your eyes. HOME CARE   Keep the affected body part above the level of the heart while lying down.  Do not sit still or stand for a long time.  Do not put anything right under your knees when you lie down.  Do not wear tight clothes on your upper legs.  Exercise your legs to help the puffiness (swelling) go down.  Wear elastic bandages or support stockings as told by your doctor.  A low-salt diet may help lessen the puffiness.  Only take medicine as told by your doctor. GET HELP IF:  Treatment is not working.  You have heart, liver, or kidney disease and notice that your skin looks puffy or shiny.  You have puffiness in your legs that does not get better when you raise your legs.  You have sudden  weight gain for no reason. GET HELP RIGHT AWAY IF:   You have shortness of breath or chest pain.  You cannot breathe when you lie down.  You have pain, redness, or warmth in the areas that are puffy.  You have heart, liver, or kidney disease and get edema all of a sudden.  You have a fever and your symptoms get worse all of a sudden. MAKE SURE YOU:   Understand these instructions.  Will watch your condition.  Will get help right away if you are not doing well or get worse. Document Released: 06/26/2007 Document Revised: 01/12/2013 Document Reviewed: 10/30/2012 [facility] Patient  Information 2015 [place], [place]. This information is not intended to replace advice given to you by your health care provider. Make sure you discuss any questions you have with your health care provider.  DASH Eating Plan DASH stands for "Dietary Approaches to Stop Hypertension." The DASH eating plan is a healthy eating plan that has been shown to reduce high blood pressure (hypertension). Additional health benefits may include reducing the risk of type 2 diabetes mellitus, heart disease, and stroke. The DASH eating plan may also help with weight loss. WHAT DO I NEED TO KNOW ABOUT THE DASH EATING PLAN? For the DASH eating plan, you will follow these general guidelines:  Choose foods with a percent daily value for sodium of less than 5% (as listed on the food label).  Use salt-free seasonings or herbs instead of table salt or sea salt.  Check with your health care provider or pharmacist before using salt substitutes.  Eat lower-sodium products, often labeled as "lower sodium" or "no salt added."  Eat fresh foods.  Eat more vegetables, fruits, and low-fat dairy products.  Choose whole grains. Look for the word "whole" as the first word in the ingredient list.  Choose fish and skinless chicken or [place] more often than red meat. Limit fish, poultry, and meat to 6 oz (170 g) each day.  Limit sweets, desserts, sugars, and sugary drinks.  Choose heart-healthy fats.  Limit cheese to 1 oz (28 g) per day.  Eat more home-cooked food and less restaurant, buffet, and fast food.  Limit fried foods.  Cook foods using methods other than frying.  Limit canned vegetables. If you do use them, rinse them well to decrease the sodium.  When eating at a restaurant, ask that your food be prepared with less salt, or no salt if possible. WHAT FOODS CAN I EAT? Seek help from a dietitian for individual calorie needs. Grains Whole grain or whole wheat bread. Brown rice. Whole grain or whole wheat pasta.  Quinoa, bulgur, and whole grain cereals. Low-sodium cereals. Corn or whole wheat flour tortillas. Whole grain cornbread. Whole grain crackers. Low-sodium crackers. Vegetables Fresh or frozen vegetables (raw, steamed, roasted, or grilled). Low-sodium or reduced-sodium tomato and vegetable juices. Low-sodium or reduced-sodium tomato sauce and paste. Low-sodium or reduced-sodium canned vegetables.  Fruits All fresh, canned (in natural juice), or frozen fruits. Meat and Other Protein Products Ground beef (85% or leaner), grass-fed beef, or beef trimmed of fat. Skinless chicken or [place]. Ground chicken or [place]. Pork trimmed of fat. All fish and seafood. Eggs. Dried beans, peas, or lentils. Unsalted nuts and seeds. Unsalted canned beans. Dairy Low-fat dairy products, such as skim or 1% milk, 2% or reduced-fat cheeses, low-fat ricotta or cottage cheese, or plain low-fat yogurt. Low-sodium or reduced-sodium cheeses. Fats and Oils Tub margarines without trans fats. Light or reduced-fat mayonnaise and salad dressings (reduced  sodium). Avocado. Safflower, olive, or canola oils. Natural peanut or almond butter. Other Unsalted popcorn and pretzels. The items listed above may not be a complete list of recommended foods or beverages. Contact your dietitian for more options. WHAT FOODS ARE NOT RECOMMENDED? Grains White bread. White pasta. White rice. Refined cornbread. Bagels and croissants. Crackers that contain trans fat. Vegetables Creamed or fried vegetables. Vegetables in a cheese sauce. Regular canned vegetables. Regular canned tomato sauce and paste. Regular tomato and vegetable juices. Fruits Dried fruits. Canned fruit in light or heavy syrup. Fruit juice. Meat and Other Protein Products Fatty cuts of meat. Ribs, chicken wings, bacon, sausage, bologna, salami, chitterlings, fatback, hot dogs, bratwurst, and packaged luncheon meats. Salted nuts and seeds. Canned beans with salt. Dairy Whole or 2%  milk, cream, half-and-half, and cream cheese. Whole-fat or sweetened yogurt. Full-fat cheeses or blue cheese. Nondairy creamers and whipped toppings. Processed cheese, cheese spreads, or cheese curds. Condiments Onion and garlic salt, seasoned salt, table salt, and sea salt. Canned and packaged gravies. Worcestershire sauce. Tartar sauce. Barbecue sauce. Teriyaki sauce. Soy sauce, including reduced sodium. Steak sauce. Fish sauce. Oyster sauce. Cocktail sauce. Horseradish. Ketchup and mustard. Meat flavorings and tenderizers. Bouillon cubes. Hot sauce. Tabasco sauce. Marinades. Taco seasonings. Relishes. Fats and Oils Butter, stick margarine, lard, shortening, ghee, and bacon fat. Coconut, palm kernel, or palm oils. Regular salad dressings. Other Pickles and olives. Salted popcorn and pretzels. The items listed above may not be a complete list of foods and beverages to avoid. Contact your dietitian for more information. WHERE CAN I FIND MORE INFORMATION? National Heart, Lung, and Blood Institute: travelstabloid.com Document Released: 12/27/2010 Document Revised: 05/24/2013 Document Reviewed: 11/11/2012 [facility] Patient Information 2015 [place], [place]. This information is not intended to replace advice given to you by your health care provider. Make sure you discuss any questions you have with your health care provider.  Managing Your High Blood Pressure Blood pressure is a measurement of how forceful your blood is pressing against the walls of the arteries. Arteries are muscular tubes within the circulatory system. Blood pressure does not stay the same. Blood pressure rises when you are active, excited, or nervous; and it lowers during sleep and relaxation. If the numbers measuring your blood pressure stay above normal most of the time, you are at risk for health problems. High blood pressure (hypertension) is a long-term (chronic) condition in which blood pressure is  elevated. A blood pressure reading is recorded as two numbers, such as 120 over 80 (or 120/80). The first, higher number is called the systolic pressure. It is a measure of the pressure in your arteries as the heart beats. The second, lower number is called the diastolic pressure. It is a measure of the pressure in your arteries as the heart relaxes between beats.  Keeping your blood pressure in a normal range is important to your overall health and prevention of health problems, such as heart disease and stroke. When your blood pressure is uncontrolled, your heart has to work harder than normal. High blood pressure is a very common condition in adults because blood pressure tends to rise with age. Men and women are equally likely to have hypertension but at different times in life. Before age 71, men are more likely to have hypertension. After 47 years of age, women are more likely to have it. Hypertension is especially common in African Americans. This condition often has no signs or symptoms. The cause of the condition is usually not known.  Your caregiver can help you come up with a plan to keep your blood pressure in a normal, healthy range. BLOOD PRESSURE STAGES Blood pressure is classified into four stages: normal, prehypertension, stage 1, and stage 2. Your blood pressure reading will be used to determine what type of treatment, if any, is necessary. Appropriate treatment options are tied to these four stages:  Normal  Systolic pressure (mm Hg): below 120.  Diastolic pressure (mm Hg): below 80. Prehypertension  Systolic pressure (mm Hg): 120 to 139.  Diastolic pressure (mm Hg): 80 to 89. Stage1  Systolic pressure (mm Hg): 140 to 159.  Diastolic pressure (mm Hg): 90 to 99. Stage2  Systolic pressure (mm Hg): 160 or above.  Diastolic pressure (mm Hg): 100 or above. RISKS RELATED TO HIGH BLOOD PRESSURE Managing your blood pressure is an important responsibility. Uncontrolled high blood  pressure can lead to:  A heart attack.  A stroke.  A weakened blood vessel (aneurysm).  Heart failure.  Kidney damage.  Eye damage.  Metabolic syndrome.  Memory and concentration problems. HOW TO MANAGE YOUR BLOOD PRESSURE Blood pressure can be managed effectively with lifestyle changes and medicines (if needed). Your caregiver will help you come up with a plan to bring your blood pressure within a normal range. Your plan should include the following: Education  Read all information provided by your caregivers about how to control blood pressure.  Educate yourself on the latest guidelines and treatment recommendations. New research is always being done to further define the risks and treatments for high blood pressure. Lifestylechanges  Control your weight.  Avoid smoking.  Stay physically active.  Reduce the amount of salt in your diet.  Reduce stress.  Control any chronic conditions, such as high cholesterol or diabetes.  Reduce your alcohol intake. Medicines  Several medicines (antihypertensive medicines) are available, if needed, to bring blood pressure within a normal range. Communication  Review all the medicines you take with your caregiver because there may be side effects or interactions.  Talk with your caregiver about your diet, exercise habits, and other lifestyle factors that may be contributing to high blood pressure.  See your caregiver regularly. Your caregiver can help you create and adjust your plan for managing high blood pressure. RECOMMENDATIONS FOR TREATMENT AND FOLLOW-UP  The following recommendations are based on current guidelines for managing high blood pressure in nonpregnant adults. Use these recommendations to identify the proper follow-up period or treatment option based on your blood pressure reading. You can discuss these options with your caregiver.  Systolic pressure of 789 to 381 or diastolic pressure of 80 to 89: Follow up with  your caregiver as directed.  Systolic pressure of 017 to 510 or diastolic pressure of 90 to 100: Follow up with your caregiver within 2 months.  Systolic pressure above 258 or diastolic pressure above 527: Follow up with your caregiver within 1 month.  Systolic pressure above 782 or diastolic pressure above 423: Consider antihypertensive therapy; follow up with your caregiver within 1 week.  Systolic pressure above 536 or diastolic pressure above 144: Begin antihypertensive therapy; follow up with your caregiver within 1 week. Document Released: 10/02/2011 Document Reviewed: 10/02/2011 [facility] Patient Information 2015 [place]. This information is not intended to replace advice given to you by your health care provider. Make sure you discuss any questions you have with your health care provider.

## 2013-09-13 ENCOUNTER — Telehealth: Payer: Self-pay | Admitting: Medical

## 2013-09-13 NOTE — Telephone Encounter (Signed)
Pt called and stated that she was in hospital and wants you to review the notes. She also states that she tinks she needs a stronger bp meds. [facility] uses [facility].

## 2013-09-14 ENCOUNTER — Other Ambulatory Visit: Payer: Self-pay | Admitting: Medical

## 2013-09-14 MED ORDER — HYDROCHLOROTHIAZIDE 25 MG PO TABS: 25.0000 mg | ORAL_TABLET | Freq: Every day | ORAL | Status: DC

## 2013-09-14 MED ORDER — ATENOLOL 25 MG PO TABS: 25.0000 mg | ORAL_TABLET | Freq: Every day | ORAL | Status: DC

## 2013-09-14 NOTE — Telephone Encounter (Signed)
I have reviewed the ED notes.  I refilled her atenolol and I added Hydrochlorothiazide 25mg  daily.  Take both in the morning.   Make sure she is drinking a good amount of water daily and consistent in her water intake.  Limit salt intake, work on eating a healthy diet, and start walking for exercise daily if she is not doing this already.  Have her recheck here in 1 month on blood pressure.

## 2013-09-14 NOTE — Telephone Encounter (Signed)
Patient is aware of [facility] message in detail. CLS

## 2013-10-07 NOTE — Telephone Encounter (Signed)
10/07/2013 °

## 2013-10-14 ENCOUNTER — Ambulatory Visit: Payer: Commercial Indemnity | Admitting: Cardiology

## 2013-10-28 LAB — HM MAMMOGRAPHY

## 2013-11-02 ENCOUNTER — Encounter: Payer: Self-pay | Admitting: Internal Medicine

## 2013-11-08 ENCOUNTER — Encounter: Payer: Self-pay | Admitting: Cardiology

## 2013-11-22 ENCOUNTER — Encounter (HOSPITAL_COMMUNITY): Payer: Self-pay | Admitting: Emergency Medicine

## 2014-01-08 ENCOUNTER — Other Ambulatory Visit: Payer: Self-pay | Admitting: Medical

## 2014-02-15 ENCOUNTER — Other Ambulatory Visit: Payer: Self-pay | Admitting: Medical

## 2014-07-08 ENCOUNTER — Other Ambulatory Visit: Payer: Self-pay | Admitting: Medical

## 2014-07-18 ENCOUNTER — Ambulatory Visit (INDEPENDENT_AMBULATORY_CARE_PROVIDER_SITE_OTHER): Payer: No Typology Code available for payment source | Admitting: Medical

## 2014-07-18 ENCOUNTER — Encounter: Payer: Self-pay | Admitting: Medical

## 2014-07-18 VITALS — BP 118/70 | HR 88 | Resp 15 | Wt 208.0 lb

## 2014-07-18 DIAGNOSIS — M549 Dorsalgia, unspecified: Secondary | ICD-10-CM

## 2014-07-18 DIAGNOSIS — I10 Essential (primary) hypertension: Secondary | ICD-10-CM | POA: Diagnosis not present

## 2014-07-18 DIAGNOSIS — M62830 Muscle spasm of back: Secondary | ICD-10-CM | POA: Diagnosis not present

## 2014-07-18 LAB — LIPID PANEL
Cholesterol: 166 mg/dL (ref 0–200)
HDL: 48 mg/dL (ref >=46)
LDL Cholesterol: 100 mg/dL — ABNORMAL HIGH (ref 0–99)
Total CHOL/HDL Ratio: 3.5 Ratio
Triglycerides: 89 mg/dL (ref <150)
VLDL: 18 mg/dL (ref 0–40)

## 2014-07-18 MED ORDER — HYDROCHLOROTHIAZIDE 25 MG PO TABS: 25.0000 mg | ORAL_TABLET | Freq: Every day | ORAL | Status: DC

## 2014-07-18 MED ORDER — ATENOLOL 25 MG PO TABS: 25.0000 mg | ORAL_TABLET | Freq: Every day | ORAL | Status: DC

## 2014-07-18 MED ORDER — CYCLOBENZAPRINE HCL 10 MG PO TABS: ORAL_TABLET | ORAL | Status: DC

## 2014-07-18 NOTE — Progress Notes (Signed)
Subjective: Here for routine med check on HTN.   Compliant with Atenolol 25mg  daily and HCTZ 25mg  daily.  At last visit in August was sent to the ED for chest pain and SOB.   No major cause found.  No chest pains since last visit.  Exercise most days per week, goes to [employer]. Got up to 229lb, but down to 205lb.  She has made significant diet changes, has cut out fried foods, lot more vegetables and fish.  Checks BPs at home, typically 110s/mid 70s in general.    Having some back pain.  Sneezed one day in the shower a month ago, back locked up, eased up ,but feels like its trying to lock again.  Used some advil with some relief.   Has some tingling in left leg, some buttock pain.  denies fall, no other injury or trauma, no heavy lifting.   Stay at home mom. No other aggravating or relieving factors. No other complaint.  Past Medical History  Diagnosis Date  . Hypertension   . Seasonal allergic rhinitis   . Wears glasses   . IUD (intrauterine device) in place 2005    removed 2015  . Mixed incontinence    Past Surgical History  Procedure Laterality Date  . Wisdom tooth extraction    . No past surgeries    . Dilatation & curettage/hysteroscopy with trueclear N/A 01/08/2013    Procedure: DILATATION & CURETTAGE/HYSTEROSCOPY WITH TRUCLEAR, polypectomy;  Surgeon: Claiborne Billings A. Pamala Hurry, MD;  Location: [facility];  Service: Gynecology;  Laterality: N/A;  . Laparoscopic tubal ligation Bilateral 01/08/2013    Procedure: DIAGNOSTIC LAPAROSCOPY Bilateral Salpingectomy;  Surgeon: Claiborne Billings A. Pamala Hurry, MD;  Location: [facility];  Service: Gynecology;  Laterality: Bilateral;    ROS as in subjective   Objective: BP 118/70 mmHg  Pulse 88  Resp 15  Wt 208 lb (94.348 kg)  LMP 11/28/2012  BP Readings from Last 3 Encounters:  07/18/14 118/70  09/06/13 148/81  09/06/13 162/102   Wt Readings from Last 3 Encounters:  07/18/14 208 lb (94.348 kg)  09/06/13 214 lb (97.07 kg)  04/15/13 211 lb (95.709 kg)     General appearance: alert, no distress, WD/WN Oral cavity: MMM, no lesions Neck: supple, no lymphadenopathy, no thyromegaly, no masses Heart: RRR, normal S1, S2, no murmurs Lungs: CTA bilaterally, no wheezes, rhonchi, or rales Abdomen: +bs, soft, non tender, non distended, no masses, no hepatomegaly, no splenomegaly Back: tender along left lumbar paraspinal region, no midline tenderness, mild pain with ROM which is limited by spasm and pain.   Legs:  Mild pain with left leg SLR at about 30 degrees, hips nontender, rest of legs nontender, normal hip and leg ROM Pulses: 2+ symmetric, upper and lower extremities, normal cap refill Neuro: pain with heel and toe walk but able to do both, otherwise normal strength and sensation and DTRs of legs     Assessment: Encounter Diagnoses  Name Primary?  . Essential hypertension Yes  . Acute back pain   . Back spasm     Plan: HTN - labs today for micro albumin and lipid . C/t same medication, healthy diet, routine exercise  Acute back pain, spasm - advised heat, gentle stretching, massage, begin prn flexeril, can use OTC Advil the next 5-7 days.   C/t routine exercise.  If not improving within 7-10 days, let me know

## 2014-07-19 LAB — MICROALBUMIN / CREATININE URINE RATIO
Creatinine, Urine: 358.7 mg/dL
Microalb Creat Ratio: 8.9 mg/g (ref 0.0–30.0)
Microalb, Ur: 3.2 mg/dL — ABNORMAL HIGH (ref <2.0)

## 2014-09-28 ENCOUNTER — Encounter: Payer: No Typology Code available for payment source | Admitting: Medical

## 2014-10-28 ENCOUNTER — Encounter: Payer: Self-pay | Admitting: Medical

## 2014-10-28 ENCOUNTER — Ambulatory Visit (INDEPENDENT_AMBULATORY_CARE_PROVIDER_SITE_OTHER): Payer: No Typology Code available for payment source | Admitting: Medical

## 2014-10-28 ENCOUNTER — Ambulatory Visit
Admission: RE | Admit: 2014-10-28 | Discharge: 2014-10-28 | Disposition: A | Discharge status: Home / Self Care | Payer: PRIVATE HEALTH INSURANCE | Source: Ambulatory Visit | Attending: Medical | Admitting: Medical

## 2014-10-28 VITALS — BP 150/90 | HR 76 | Temp 98.9°F | Wt 210.0 lb

## 2014-10-28 DIAGNOSIS — M79605 Pain in left leg: Secondary | ICD-10-CM

## 2014-10-28 DIAGNOSIS — M79602 Pain in left arm: Secondary | ICD-10-CM

## 2014-10-28 DIAGNOSIS — S76912A Strain of unspecified muscles, fascia and tendons at thigh level, left thigh, initial encounter: Secondary | ICD-10-CM | POA: Diagnosis not present

## 2014-10-28 NOTE — Patient Instructions (Signed)
Encounter Diagnoses  Name Primary?  . Leg pain, superior, left Yes  . Muscle strain of thigh, left, initial encounter     Recommendations:  Continue to use ice and heat alternating 20 minutes at a time twice daily  Use rest, no exercise for now  Use Aleve once daily in the morning with food  Go for xray  Assuming normal xray, we will refer you to physical therapy to rehabilitate the left thigh  Groin Strain A groin strain (also called a groin pull) is an injury to the muscles or tendon on the upper inner part of the thigh. These muscles are called the adductor muscles or groin muscles. They are responsible for moving the leg across the body. A muscle strain occurs when a muscle is overstretched and some muscle fibers are torn. A groin strain can range from mild to severe depending on how many muscle fibers are affected and whether the muscle fibers are partially or completely torn.  Groin strains usually occur during exercise or participation in sports. The injury often happens when a sudden, violent force is placed on a muscle, stretching the muscle too far. A strain is more likely to occur when your muscles are not warmed up or if you are not properly conditioned. Depending on the severity of the groin strain, recovery time may vary from a few weeks to several weeks. Severe injuries often require 4-6 weeks for recovery. In these cases, complete healing can take 4-5 months.  CAUSES   Stretching the groin muscles too far or too suddenly, often during side-to-side motion with an abrupt change in direction.  Putting repeated stress on the groin muscles over a long period of time.  Performing vigorous activity without properly stretching the groin muscles beforehand. SYMPTOMS   Pain and tenderness in the groin area. This begins as sharp pain and persists as a dull ache.  Popping or snapping feeling when the injury occurs (for severe strains).  Swelling or bruising.  Muscle  spasms.  Weakness in the leg.  Stiffness in the groin area with decreased ability to move the affected muscles. DIAGNOSIS  Your caregiver will perform a physical exam to diagnose a groin strain. You will be asked about your symptoms and how the injury occurred. X-rays are sometimes needed to rule out a broken bone or cartilage problems. Your caregiver may order a CT scan or MRI if a complete muscle tear is suspected. TREATMENT  A groin strain will often heal on its own. Your caregiver may prescribe medicines to help manage pain and swelling (anti-inflammatory medicine). You may be told to use crutches for the first few days to minimize your pain. HOME CARE INSTRUCTIONS   Rest. Do not use the strained muscle if it causes pain.  Put ice on the injured area.  Put ice in a plastic bag.  Place a towel between your skin and the bag.  Leave the ice on for 15-20 minutes, every 2-3 hours. Do this for the first 2 days after the injury.  Only take over-the-counter or prescription medicines as directed by your caregiver.  Wrap the injured area with an elastic bandage as directed by your caregiver.  Keep the injured leg raised (elevated).  Walk, stretch, and perform range-of-motion exercises to improve blood flow to the injured area. Only perform these activities if you can do so without any pain. To prevent muscle strains:  Warm up before exercise.  Develop proper conditioning and strength in the groin muscles. [place]  CARE IF:   You have increased pain or swelling in the affected area.   Your symptoms are not improving or are getting worse. MAKE SURE YOU:   Understand these instructions.  Will watch your condition.  Will get help right away if you are not doing well or get worse.   This information is not intended to replace advice given to you by your health care provider. Make sure you discuss any questions you have with your health care provider.   Document  Released: 09/05/2003 Document Revised: 12/25/2011 Document Reviewed: 09/11/2011 Elsevier Interactive Patient Education [employer].

## 2014-10-28 NOTE — Progress Notes (Signed)
Subjective: Chief Complaint  Patient presents with  . left leg pain    started 3 weeks ago.0-10 scale rates a 7. started exercising and taking zumba classes. not stretching before she does these activities. taken tylenol, taking warm baths in epsom salts, and using heating pads and the only thing that changed was her leg was swollen and that went down. no other concerns   Thinks she pulled a muscle.  She notes 3 weeks ago started feeling pains in left thigh, middle anterior and posterior.  She has been doing zumba in general.  She notes 3 weeks ago one particularly day decided to do a run with her friends.  Legs had felt tight before running, did a 2 mile run, stretched afterwards.  Leg swelled initially.  Using heat, ice, epsom salt soaks.   Took some leftover muscle relaxer.  All night the muscles seem to tremble.  Using Advil or aleve.  Nothing seems to help.   Denies back pain, but hips sometimes get stiff since hurting the left leg.  Left leg at times feels tingling or pulsating.  No other aggravating or relieving factors. No other complaint.  Past Medical History  Diagnosis Date  . Hypertension   . Seasonal allergic rhinitis   . Wears glasses   . IUD (intrauterine device) in place 2005    removed 2015  . Mixed incontinence     Past Surgical History  Procedure Laterality Date  . Wisdom tooth extraction    . No past surgeries    . Dilatation & curettage/hysteroscopy with trueclear N/A 01/08/2013    Procedure: DILATATION & CURETTAGE/HYSTEROSCOPY WITH TRUCLEAR, polypectomy;  Surgeon: Claiborne Billings A. Pamala Hurry, MD;  Location: [facility];  Service: Gynecology;  Laterality: N/A;  . Laparoscopic tubal ligation Bilateral 01/08/2013    Procedure: DIAGNOSTIC LAPAROSCOPY Bilateral Salpingectomy;  Surgeon: Claiborne Billings A. Pamala Hurry, MD;  Location: [facility];  Service: Gynecology;  Laterality: Bilateral;   ROS as in subjective   Objective: BP 150/90 mmHg  Pulse 76  Temp(Src) 98.9 F (37.2 C)  Wt 210 lb (95.255  kg)  LMP 11/28/2012  Gen: wd, wn, nad Skin: no obvious erythema, brushing, warmth  Back: nontender MSK: tender left medial upper thigh, tender posteromedial thigh, tender left lateral anterior thigh.  Pain with active and passive and resisted thigh adduction, flexion and lesser pain with hip extension, otherwise rest of leg exam unremarkable No leg swelling No edema Normal leg sensation, seemingly normal strength   Assessment: Encounter Diagnoses  Name Primary?  . Leg pain, superior, left Yes  . Muscle strain of thigh, left, initial encounter      Plan: Likely moderate strain injury of left thigh adductor, hip extensors and flexors.  Will send for xray to rule out obvious avulsion injury, and will send to PT.  Recommendations:  Continue to use ice and heat alternating 20 minutes at a time twice daily  Use rest, no exercise for now  Use Aleve once daily in the morning with food  Go for xray  Assuming normal xray, we will refer you to physical therapy to rehabilitate the left thigh  Sharon Bond was seen today for left leg pain.  Diagnoses and all orders for this visit:  Leg pain, superior, left -     Ambulatory referral to Physical Medicine Rehab -     DG HIP UNILAT WITH PELVIS 2-3 VIEWS LEFT; Future  Muscle strain of thigh, left, initial encounter -     Ambulatory referral to Physical Medicine Rehab -  DG HIP UNILAT WITH PELVIS 2-3 VIEWS LEFT; Future

## 2015-01-22 ENCOUNTER — Other Ambulatory Visit: Payer: Self-pay | Admitting: Medical

## 2015-08-05 ENCOUNTER — Other Ambulatory Visit: Payer: Self-pay | Admitting: Medical

## 2015-08-07 ENCOUNTER — Other Ambulatory Visit: Payer: Self-pay | Admitting: Medical

## 2015-11-10 ENCOUNTER — Other Ambulatory Visit: Payer: Self-pay | Admitting: Medical

## 2017-01-21 HISTORY — PX: PARTIAL HYSTERECTOMY: SHX80

## 2017-02-27 DIAGNOSIS — L2084 Intrinsic (allergic) eczema: Secondary | ICD-10-CM | POA: Insufficient documentation

## 2017-04-03 ENCOUNTER — Other Ambulatory Visit: Payer: Self-pay | Admitting: Nurse Practitioner

## 2017-04-03 DIAGNOSIS — Z1231 Encounter for screening mammogram for malignant neoplasm of breast: Secondary | ICD-10-CM

## 2017-04-23 ENCOUNTER — Ambulatory Visit
Admission: RE | Admit: 2017-04-23 | Discharge: 2017-04-23 | Disposition: A | Discharge status: Home / Self Care | Payer: BLUE CROSS/BLUE SHIELD | Source: Ambulatory Visit | Attending: Nurse Practitioner | Admitting: Nurse Practitioner

## 2017-04-23 DIAGNOSIS — Z1231 Encounter for screening mammogram for malignant neoplasm of breast: Secondary | ICD-10-CM

## 2017-08-12 DIAGNOSIS — N941 Unspecified dyspareunia: Secondary | ICD-10-CM | POA: Insufficient documentation

## 2017-08-12 DIAGNOSIS — N946 Dysmenorrhea, unspecified: Secondary | ICD-10-CM | POA: Insufficient documentation

## 2017-08-12 DIAGNOSIS — D259 Leiomyoma of uterus, unspecified: Secondary | ICD-10-CM | POA: Insufficient documentation

## 2017-08-12 DIAGNOSIS — N920 Excessive and frequent menstruation with regular cycle: Secondary | ICD-10-CM | POA: Insufficient documentation

## 2019-04-29 ENCOUNTER — Ambulatory Visit: Payer: PRIVATE HEALTH INSURANCE | Admitting: Nurse Practitioner

## 2019-04-29 ENCOUNTER — Encounter: Payer: Self-pay | Admitting: Nurse Practitioner

## 2019-04-29 ENCOUNTER — Other Ambulatory Visit: Payer: Self-pay

## 2019-04-29 VITALS — BP 132/80 | HR 82 | Temp 98.4°F | Ht 63.0 in | Wt 212.6 lb

## 2019-04-29 DIAGNOSIS — Z13228 Encounter for screening for other metabolic disorders: Secondary | ICD-10-CM | POA: Diagnosis not present

## 2019-04-29 DIAGNOSIS — I10 Essential (primary) hypertension: Secondary | ICD-10-CM

## 2019-04-29 DIAGNOSIS — R350 Frequency of micturition: Secondary | ICD-10-CM | POA: Diagnosis not present

## 2019-04-29 DIAGNOSIS — R21 Rash and other nonspecific skin eruption: Secondary | ICD-10-CM | POA: Diagnosis not present

## 2019-04-29 LAB — POCT URINALYSIS DIPSTICK
Bilirubin, UA: NEGATIVE
Blood, UA: NEGATIVE
Glucose, UA: NEGATIVE
Ketones, UA: NEGATIVE
Leukocytes, UA: NEGATIVE
Nitrite, UA: NEGATIVE
Protein, UA: NEGATIVE
Spec Grav, UA: 1.02 (ref 1.010–1.025)
Urobilinogen, UA: 0.2 E.U./dL
pH, UA: 7.5 (ref 5.0–8.0)

## 2019-04-29 LAB — POCT UA - MICROALBUMIN
Albumin/Creatinine Ratio, Urine, POC: 30
Creatinine, POC: 100 mg/dL
Microalbumin Ur, POC: 10 mg/L

## 2019-04-29 MED ORDER — TRIAMCINOLONE ACETONIDE 0.1 % EX CREA: 1.0000 "application " | TOPICAL_CREAM | CUTANEOUS | 2 refills | Status: DC | PRN

## 2019-04-29 MED ORDER — HYDROCHLOROTHIAZIDE 25 MG PO TABS
25.0000 mg | ORAL_TABLET | Freq: Every day | ORAL | 1 refills | Status: DC
Start: 2019-04-29 — End: 2020-03-02

## 2019-04-29 NOTE — Addendum Note (Signed)
Addended by: Luana Shu on: 04/29/2019 11:48 AM   Modules accepted: Orders

## 2019-04-29 NOTE — Progress Notes (Signed)
This visit occurred during the SARS-CoV-2 public health emergency.  Safety protocols were in place, including screening questions prior to the visit, additional usage of staff PPE, and extensive cleaning of exam room while observing appropriate contact time as indicated for disinfecting solutions.  Subjective:     Patient ID: Sharon Bond , female    DOB: [birth year] , 54 y.o.   MRN: [id number]   Chief Complaint  Patient presents with  . Establish Care  . Hypertension    patient stated she has not had any blood pressure medicine since jan. she stated she was trying all natural remedies but it is not working anymore    HPI  Here to establish care.  She found [place] through the [employer]. She was being seen at [facility] prior to coming her on [place].  She lost her job when [employer] occurred and was without insurance.  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 Married. Children - 3 living children. 1 deceased child at birth she had preeclampsia.  She found out she had hypertension in 2003  PMH - hypertension.    [facility] - mother - hypertension.  Father - healthy.  Brother - diabetes.    She has been without her blood pressure medication since January She has not received the Covid, feels like a trial basis.    Last mammogram 2019, she had a hysterectomy July 2019.   She has not had a colonoscopy.   She reports a history urinary frequency, reports snoring and does not sleep well at night has to take medications to go to sleep Her blood pressure generally is more elevated than today. Has been taking a natural supplement to lower blood pressure. She is not exercising as much other than her job.  She fell down her steps twice at home   Hypertension This is a chronic problem. The current episode started more than 1 year ago. The problem is unchanged. The problem is controlled. Pertinent negatives include no anxiety, blurred vision, chest pain, headaches, malaise/fatigue or palpitations.  There are no associated agents to hypertension. Risk factors for coronary artery disease include obesity and sedentary lifestyle.  Urinary Frequency  This is a new problem. Episode onset: last 2 years. Associated symptoms include frequency (mostly at night). Pertinent negatives include no chills. She has tried nothing for the symptoms. The treatment provided no relief. There is no history of recurrent UTIs.  Insomnia Primary symptoms: no fragmented sleep, sleep disturbance, difficulty falling asleep, no malaise/fatigue.  The problem occurs nightly. Past treatments include medication. PMH includes: associated symptoms present, hypertension, no depression.     Past Medical History:  Diagnosis Date  . Eczema   . Hypertension   . IUD (intrauterine device) in place 2005   removed 2015  . Mixed incontinence   . Seasonal allergic rhinitis   . Wears glasses      Family History  Problem Relation Age of Onset  . Diabetes Mother   . Hypertension Mother   . Healthy Father   . Diabetes Brother   . Heart disease Maternal Aunt   . Cancer Neg Hx   . Stroke Neg Hx      Current Outpatient Medications:  .  triamcinolone cream (KENALOG) 0.1 %, Apply 1 application topically as needed. Pt using at least 4 times a day, Disp: , Rfl:  .  hydrochlorothiazide (HYDRODIURIL) 25 MG tablet, TAKE 1 TABLET BY MOUTH DAILY. (Patient not taking: Reported on 04/29/2019), Disp: 90  tablet, Rfl: 1 .  losartan (COZAAR) 50 MG tablet, Take 50 mg by mouth daily., Disp: , Rfl:    Allergies  Allergen Reactions  . Penicillins Swelling and Other (See Comments)    Facial swelling     Review of Systems  Constitutional: Negative for chills, fatigue and malaise/fatigue.  Eyes: Negative for blurred vision.  Respiratory: Negative.   Cardiovascular: Negative.  Negative for chest pain, palpitations and leg swelling.  Genitourinary: Positive for frequency (mostly at night).  Skin: Positive for rash (rash to arms, legs and  ankles).  Neurological: Negative for dizziness and headaches.  Psychiatric/Behavioral: Positive for sleep disturbance. Negative for depression. The patient has insomnia. The patient is not nervous/anxious.      Today's Vitals   04/29/19 1023  BP: 132/80  Pulse: 82  Temp: 98.4 F (36.9 C)  TempSrc: Oral  SpO2: 96%  Weight: 212 lb 9.6 oz (96.4 kg)  Height: 5' 3" (1.6 m)  PainSc: 0-No pain   Body mass index is 37.66 kg/m.   Objective:  Physical Exam Vitals reviewed.  Constitutional:      Appearance: Normal appearance.  Cardiovascular:     Rate and Rhythm: Normal rate and regular rhythm.     Pulses: Normal pulses.     Heart sounds: Normal heart sounds. No murmur.  Pulmonary:     Effort: Pulmonary effort is normal. No respiratory distress.     Breath sounds: Normal breath sounds.  Skin:    General: Skin is warm.     Findings: Rash (scattered rash to arms bilaterally) present.  Neurological:     General: No focal deficit present.     Mental Status: She is alert and oriented to person, place, and time.     Cranial Nerves: No cranial nerve deficit.  Psychiatric:        Mood and Affect: Mood normal.        Behavior: Behavior normal.        Thought Content: Thought content normal.        Judgment: Judgment normal.         Assessment And Plan:     1. Essential hypertension  Chronic, fair control  Will restart HCTZ only   Continue with focusing on healthy diet low in salt - hydrochlorothiazide (HYDRODIURIL) 25 MG tablet; Take 1 tablet (25 mg total) by mouth daily.  Dispense: 90 tablet; Refill: 1 - CBC  2. Urinary frequency  Will check for urinary tract infection  I will also check for elevated sugar   3. Encounter for screening for metabolic disorder  - [id number] - Hemoglobin A1c - Lipid panel  4. Rash and nonspecific skin eruption  Scattered slightly raised rash to bilateral arms  Will refill her triamcilone cream  [name], FNP    THE  PATIENT IS ENCOURAGED TO PRACTICE SOCIAL DISTANCING DUE TO THE COVID-19 PANDEMIC.

## 2019-04-30 LAB — CMP14+EGFR
ALT: 17 IU/L (ref 0–32)
AST: 16 IU/L (ref 0–40)
Albumin/Globulin Ratio: 1.8 (ref 1.2–2.2)
Albumin: 4.5 g/dL (ref 3.8–4.9)
Alkaline Phosphatase: 100 IU/L (ref 39–117)
BUN/Creatinine Ratio: 12 (ref 9–23)
BUN: 9 mg/dL (ref 6–24)
Bilirubin Total: 0.3 mg/dL (ref 0.0–1.2)
CO2: 26 mmol/L (ref 20–29)
Calcium: 9.4 mg/dL (ref 8.7–10.2)
Chloride: 103 mmol/L (ref 96–106)
Creatinine, Ser: 0.75 mg/dL (ref 0.57–1.00)
GFR calc Af Amer: 106 mL/min/{1.73_m2} (ref >59)
GFR calc non Af Amer: 92 mL/min/{1.73_m2} (ref >59)
Globulin, Total: 2.5 g/dL (ref 1.5–4.5)
Glucose: 79 mg/dL (ref 65–99)
Potassium: 3.3 mmol/L — ABNORMAL LOW (ref 3.5–5.2)
Sodium: 144 mmol/L (ref 134–144)
Total Protein: 7 g/dL (ref 6.0–8.5)

## 2019-04-30 LAB — CBC
Hematocrit: 44.1 % (ref 34.0–46.6)
Hemoglobin: 14.7 g/dL (ref 11.1–15.9)
MCH: 29.6 pg (ref 26.6–33.0)
MCHC: 33.3 g/dL (ref 31.5–35.7)
MCV: 89 fL (ref 79–97)
Platelets: 206 10*3/uL (ref 150–450)
RBC: 4.96 x10E6/uL (ref 3.77–5.28)
RDW: 12.8 % (ref 11.7–15.4)
WBC: 5.5 10*3/uL (ref 3.4–10.8)

## 2019-04-30 LAB — LIPID PANEL
Chol/HDL Ratio: 2.9 ratio (ref 0.0–4.4)
Cholesterol, Total: 180 mg/dL (ref 100–199)
HDL: 62 mg/dL (ref >39)
LDL Chol Calc (NIH): 101 mg/dL — ABNORMAL HIGH (ref 0–99)
Triglycerides: 93 mg/dL (ref 0–149)
VLDL Cholesterol Cal: 17 mg/dL (ref 5–40)

## 2019-04-30 LAB — HEMOGLOBIN A1C
Est. average glucose Bld gHb Est-mCnc: 120 mg/dL
Hgb A1c MFr Bld: 5.8 % — ABNORMAL HIGH (ref 4.8–5.6)

## 2019-05-26 ENCOUNTER — Ambulatory Visit (HOSPITAL_COMMUNITY)
Admission: EM | Admit: 2019-05-26 | Discharge: 2019-05-26 | Disposition: A | Discharge status: Home / Self Care | Payer: No Typology Code available for payment source | Attending: Family Medicine | Admitting: Family Medicine

## 2019-05-26 ENCOUNTER — Encounter (HOSPITAL_COMMUNITY): Payer: Self-pay | Admitting: Emergency Medicine

## 2019-05-26 ENCOUNTER — Other Ambulatory Visit: Payer: Self-pay

## 2019-05-26 DIAGNOSIS — M659 Synovitis and tenosynovitis, unspecified: Secondary | ICD-10-CM

## 2019-05-26 MED ORDER — KETOROLAC TROMETHAMINE 30 MG/ML IJ SOLN
INTRAMUSCULAR | Status: AC
  Filled 2019-05-26: qty 1

## 2019-05-26 MED ORDER — CYCLOBENZAPRINE HCL 5 MG PO TABS: 5.0000 mg | ORAL_TABLET | Freq: Two times a day (BID) | ORAL | 0 refills | Status: DC | PRN

## 2019-05-26 MED ORDER — KETOROLAC TROMETHAMINE 30 MG/ML IJ SOLN
30.0000 mg | Freq: Once | INTRAMUSCULAR | Status: AC
  Administered 2019-05-26: 30 mg via INTRAMUSCULAR

## 2019-05-26 MED ORDER — PREDNISONE 10 MG PO TABS: ORAL_TABLET | ORAL | 0 refills | Status: DC

## 2019-05-26 NOTE — Discharge Instructions (Addendum)
Begin prednisone taper over the next 6 days, begin with 6 tablets, decrease by 1 tablet each day until complete-take with food and in the morning if you are able You may use flexeril as needed to help with pain. This is a muscle relaxer and causes sedation- please use only at bedtime or when you will be home and not have to drive/work Wear wrist brace to immobilize hand/wrist consistently over the next 1 to 2 weeks Apply ice to wrist/forearm 1-2 times a day for 20 minutes Please follow-up if any symptoms not improving or worsening

## 2019-05-26 NOTE — ED Triage Notes (Signed)
PT is a Designer, jewellery at [employer]. She has pain and swelling in right arm for several weeks.

## 2019-05-26 NOTE — ED Provider Notes (Signed)
[facility]    CSN: [id number] Arrival date & time: 05/26/19  1626      History   Chief Complaint Chief Complaint  Patient presents with  . Arm Injury    HPI Sharon Bond is a 54 y.o. female history of hypertension, GERD, presenting today for evaluation of right hand pain and swelling.  Patient notes that beginning in January she began to develop pain and discomfort in her right hand, wrist and forearm.  Initially this pain would only wake her from sleep, of recently in the past week this pain has flared up and intensified.  Patient works at [employer] and uses her hands very frequently.  She has been using naproxen without relief.  Denies any specific injury trauma or fall.  Pain is shooting up into shoulder/neck beginning today.   HPI  Past Medical History:  Diagnosis Date  . Eczema   . Hypertension   . IUD (intrauterine device) in place 2005   removed 2015  . Mixed incontinence   . Seasonal allergic rhinitis   . Wears glasses     Patient Active Problem List   Diagnosis Date Noted  . Elevated blood pressure 10/07/2012  . HYPOKALEMIA 03/20/2006  . ANXIETY 03/20/2006  . HYPERTENSION, BENIGN SYSTEMIC 03/20/2006  . RHINITIS, ALLERGIC 03/20/2006  . GASTROESOPHAGEAL REFLUX, NO ESOPHAGITIS 03/20/2006  . FIBROMYALGIA, FIBROMYOSITIS 03/20/2006  . MUSCLE CRAMPS NOS 03/20/2006    Past Surgical History:  Procedure Laterality Date  . DILATATION & CURETTAGE/HYSTEROSCOPY WITH TRUECLEAR N/A 01/08/2013   Procedure: DILATATION & CURETTAGE/HYSTEROSCOPY WITH TRUCLEAR, polypectomy;  Surgeon: Claiborne Billings A. Pamala Hurry, MD;  Location: [facility];  Service: Gynecology;  Laterality: N/A;  . LAPAROSCOPIC TUBAL LIGATION Bilateral 01/08/2013   Procedure: DIAGNOSTIC LAPAROSCOPY Bilateral Salpingectomy;  Surgeon: Claiborne Billings A. Pamala Hurry, MD;  Location: [facility];  Service: Gynecology;  Laterality: Bilateral;  . NO PAST SURGERIES    . WISDOM TOOTH EXTRACTION      OB History    Gravida  4   Para  4   Term  3   Preterm  1   AB      Living  2     SAB      TAB      Ectopic      Multiple      Live Births  2            Home Medications    Prior to Admission medications   Medication Sig Start Date End Date Taking? Authorizing Provider  hydrochlorothiazide (HYDRODIURIL) 25 MG tablet Take 1 tablet (25 mg total) by mouth daily. 04/29/19  Yes [name], FNP  cyclobenzaprine (FLEXERIL) 5 MG tablet Take 1-2 tablets (5-10 mg total) by mouth 2 (two) times daily as needed for muscle spasms. 05/26/19   Keiyon Plack C, PA-C  predniSONE (DELTASONE) 10 MG tablet Begin with 6 tabs on day 1, 5 tab on day 2, 4 tab on day 3, 3 tab on day 4, 2 tab on day 5, 1 tab on day 6-take with food 05/26/19   Davion Meara C, PA-C  triamcinolone cream (KENALOG) 0.1 % Apply 1 application topically as needed. Pt using at least 4 times a day 04/29/19   [name], FNP    Family History Family History  Problem Relation Age of Onset  . Diabetes Mother   . Hypertension Mother   . Healthy Father   . Diabetes Brother   . Heart disease Maternal Aunt   . Cancer Neg Hx   .  Stroke Neg Hx     Social History Social History   Tobacco Use  . Smoking status: Never Smoker  . Smokeless tobacco: Never Used  Substance Use Topics  . Alcohol use: Yes    Comment: occasionally  . Drug use: No     Allergies   Penicillins   Review of Systems Review of Systems  Constitutional: Negative for fatigue and fever.  Eyes: Negative for visual disturbance.  Respiratory: Negative for shortness of breath.   Cardiovascular: Negative for chest pain.  Gastrointestinal: Negative for abdominal pain, nausea and vomiting.  Musculoskeletal: Positive for arthralgias and joint swelling.  Skin: Negative for color change, rash and wound.  Neurological: Negative for dizziness, weakness, light-headedness and headaches.     Physical Exam Triage Vital Signs ED Triage Vitals  Enc Vitals Group     BP 05/26/19 1720 (!)  165/105     Pulse Rate 05/26/19 1720 79     Resp 05/26/19 1720 16     Temp 05/26/19 1720 98.3 F (36.8 C)     Temp Source 05/26/19 1720 Oral     SpO2 05/26/19 1720 99 %     Weight --      Height --      Head Circumference --      Peak Flow --      Pain Score 05/26/19 1718 8     Pain Loc --      Pain Edu? --      Excl. in [place]? --    No data found.  Updated Vital Signs BP (!) 165/105 (BP Location: Left Arm)   Pulse 79   Temp 98.3 F (36.8 C) (Oral)   Resp 16   LMP 11/28/2012   SpO2 99%   Visual Acuity Right Eye Distance:   Left Eye Distance:   Bilateral Distance:    Right Eye Near:   Left Eye Near:    Bilateral Near:     Physical Exam Vitals and nursing note reviewed.  Constitutional:      Appearance: She is well-developed.     Comments: No acute distress  HENT:     Head: Normocephalic and atraumatic.     Nose: Nose normal.  Eyes:     Conjunctiva/sclera: Conjunctivae normal.  Cardiovascular:     Rate and Rhythm: Normal rate.  Pulmonary:     Effort: Pulmonary effort is normal. No respiratory distress.  Abdominal:     General: There is no distension.  Musculoskeletal:        General: Normal range of motion.     Cervical back: Neck supple.     Comments: Right arm: Moderate swelling noted to right forearm, no overlying erythema or warmth, tender to palpation diffusely throughout distal forearm, distal radius and ulna extending into metacarpals, full active ROM at wrist, pain with supination, positive finkelstein's;   Full active ROM at elbow without tenderness to olecranon or medial/lateral epicondyle  Back/Neck- diffusely tender throughout superior trapezius/periscapular musculature on right  Skin:    General: Skin is warm and dry.  Neurological:     Mental Status: She is alert and oriented to person, place, and time.      UC Treatments / Results  Labs (all labs ordered are listed, but only abnormal results are displayed) Labs Reviewed - No data to  display  EKG   Radiology No results found.  Procedures Procedures (including critical care time)  Medications Ordered in UC Medications  ketorolac (TORADOL) 30 MG/ML injection 30 mg (  30 mg Intramuscular Given 05/26/19 1822)    Initial Impression / Assessment and Plan / UC Course  I have reviewed the triage vital signs and the nursing notes.  Pertinent labs & imaging results that were available during my care of the patient were reviewed by me and considered in my medical decision making (see chart for details).     History and exam suggestive of likely tenosynovitis/tendonitis triggered from repeitive/overuse injury. Recommending thumb spica, rest, predisone course and ice. Toradol prior to discharge. Discussed strict return precautions. Patient verbalized understanding and is agreeable with plan.  Final Clinical Impressions(s) / UC Diagnoses   Final diagnoses:  Tenosynovitis of right hand     Discharge Instructions     Begin prednisone taper over the next 6 days, begin with 6 tablets, decrease by 1 tablet each day until complete-take with food and in the morning if you are able You may use flexeril as needed to help with pain. This is a muscle relaxer and causes sedation- please use only at bedtime or when you will be home and not have to drive/work Wear wrist brace to immobilize hand/wrist consistently over the next 1 to 2 weeks Apply ice to wrist/forearm 1-2 times a day for 20 minutes Please follow-up if any symptoms not improving or worsening    ED Prescriptions    Medication Sig Dispense Auth. Provider   predniSONE (DELTASONE) 10 MG tablet Begin with 6 tabs on day 1, 5 tab on day 2, 4 tab on day 3, 3 tab on day 4, 2 tab on day 5, 1 tab on day 6-take with food 21 tablet Ayo Guarino C, PA-C   cyclobenzaprine (FLEXERIL) 5 MG tablet Take 1-2 tablets (5-10 mg total) by mouth 2 (two) times daily as needed for muscle spasms. 24 tablet Amere Bricco, [place] C, PA-C     PDMP  not reviewed this encounter.   Janith Lima, [place] 05/26/19 1931

## 2019-05-31 ENCOUNTER — Ambulatory Visit (HOSPITAL_COMMUNITY)
Admission: EM | Admit: 2019-05-31 | Discharge: 2019-05-31 | Disposition: A | Discharge status: Home / Self Care | Payer: No Typology Code available for payment source | Attending: Internal Medicine | Admitting: Internal Medicine

## 2019-05-31 ENCOUNTER — Encounter (HOSPITAL_COMMUNITY): Payer: Self-pay

## 2019-05-31 ENCOUNTER — Other Ambulatory Visit: Payer: Self-pay

## 2019-05-31 DIAGNOSIS — G5601 Carpal tunnel syndrome, right upper limb: Secondary | ICD-10-CM

## 2019-05-31 DIAGNOSIS — M79601 Pain in right arm: Secondary | ICD-10-CM | POA: Insufficient documentation

## 2019-05-31 MED ORDER — IBUPROFEN 600 MG PO TABS
600.0000 mg | ORAL_TABLET | Freq: Four times a day (QID) | ORAL | 0 refills | Status: DC | PRN
Start: 2019-05-31 — End: 2020-04-06

## 2019-05-31 NOTE — ED Provider Notes (Signed)
[place]    CSN: [id number] Arrival date & time: 05/31/19  1554      History   Chief Complaint Chief Complaint  Patient presents with  . Arm Pain    HPI Sharon Bond is a 54 y.o. female recently managed for carpal tunnel syndrome of the right hand comes to the urgent care with complaints of worsening pain/no improvement in pain.  Patient was managed with tapering course of prednisone.  Patient is on the last day of prednisone and says that she has no improvement in her symptoms.  Patient has been compliant with the wrist brace.  Patient has numbness and tingling in the ring and little fingers.  No weakness in the hand. Past Medical History:  Diagnosis Date  . Eczema   . Hypertension   . IUD (intrauterine device) in place 2005   removed 2015  . Mixed incontinence   . Seasonal allergic rhinitis   . Wears glasses     Patient Active Problem List   Diagnosis Date Noted  . Elevated blood pressure 10/07/2012  . HYPOKALEMIA 03/20/2006  . ANXIETY 03/20/2006  . HYPERTENSION, BENIGN SYSTEMIC 03/20/2006  . RHINITIS, ALLERGIC 03/20/2006  . GASTROESOPHAGEAL REFLUX, NO ESOPHAGITIS 03/20/2006  . FIBROMYALGIA, FIBROMYOSITIS 03/20/2006  . MUSCLE CRAMPS NOS 03/20/2006    Past Surgical History:  Procedure Laterality Date  . DILATATION & CURETTAGE/HYSTEROSCOPY WITH TRUECLEAR N/A 01/08/2013   Procedure: DILATATION & CURETTAGE/HYSTEROSCOPY WITH TRUCLEAR, polypectomy;  Surgeon: Claiborne Billings A. Pamala Hurry, MD;  Location: [facility];  Service: Gynecology;  Laterality: N/A;  . LAPAROSCOPIC TUBAL LIGATION Bilateral 01/08/2013   Procedure: DIAGNOSTIC LAPAROSCOPY Bilateral Salpingectomy;  Surgeon: Claiborne Billings A. Pamala Hurry, MD;  Location: [facility];  Service: Gynecology;  Laterality: Bilateral;  . NO PAST SURGERIES    . WISDOM TOOTH EXTRACTION      OB History    Gravida  4   Para  4   Term  3   Preterm  1   AB      Living  2     SAB      TAB      Ectopic      Multiple      Live  Births  2            Home Medications    Prior to Admission medications   Medication Sig Start Date End Date Taking? Authorizing Provider  hydrochlorothiazide (HYDRODIURIL) 25 MG tablet Take 1 tablet (25 mg total) by mouth daily. 04/29/19   [name], FNP  ibuprofen (ADVIL) 600 MG tablet Take 1 tablet (600 mg total) by mouth every 6 (six) hours as needed. 05/31/19   LampteyMyrene Galas, MD  triamcinolone cream (KENALOG) 0.1 % Apply 1 application topically as needed. Pt using at least 4 times a day 04/29/19   [name], FNP    Family History Family History  Problem Relation Age of Onset  . Diabetes Mother   . Hypertension Mother   . Healthy Father   . Diabetes Brother   . Heart disease Maternal Aunt   . Cancer Neg Hx   . Stroke Neg Hx     Social History Social History   Tobacco Use  . Smoking status: Never Smoker  . Smokeless tobacco: Never Used  Substance Use Topics  . Alcohol use: Yes    Comment: occasionally  . Drug use: No     Allergies   Penicillins and Amlodipine   Review of Systems Review of Systems  HENT: Negative.  Respiratory: Negative.   Gastrointestinal: Negative.   Genitourinary: Negative.   Musculoskeletal: Positive for arthralgias. Negative for joint swelling and myalgias.  Neurological: Negative.      Physical Exam Triage Vital Signs ED Triage Vitals  Enc Vitals Group     BP 05/31/19 1635 134/85     Pulse Rate 05/31/19 1635 89     Resp 05/31/19 1635 18     Temp 05/31/19 1635 98.4 F (36.9 C)     Temp Source 05/31/19 1635 Oral     SpO2 05/31/19 1635 97 %     Weight --      Height --      Head Circumference --      Peak Flow --      Pain Score 05/31/19 1744 0     Pain Loc --      Pain Edu? --      Excl. in [place]? --    No data found.  Updated Vital Signs BP 134/85 (BP Location: Left Arm)   Pulse 89   Temp 98.4 F (36.9 C) (Oral)   Resp 18   LMP 11/28/2012   SpO2 97%   Visual Acuity Right Eye Distance:   Left Eye  Distance:   Bilateral Distance:    Right Eye Near:   Left Eye Near:    Bilateral Near:     Physical Exam Constitutional:      General: She is not in acute distress.    Appearance: She is not ill-appearing.  HENT:     Mouth/Throat:     Pharynx: No oropharyngeal exudate or posterior oropharyngeal erythema.  Eyes:     Extraocular Movements: Extraocular movements intact.     Conjunctiva/sclera: Conjunctivae normal.  Cardiovascular:     Rate and Rhythm: Normal rate and regular rhythm.  Abdominal:     General: Bowel sounds are normal. There is no distension.     Palpations: Abdomen is soft.     Tenderness: There is no guarding or rebound.  Musculoskeletal:        General: Tenderness present. No swelling, deformity or signs of injury.     Cervical back: Normal range of motion and neck supple.     Comments: Range of motion of the right wrist is limited by pain.  Skin:    General: Skin is warm.     Capillary Refill: Capillary refill takes less than 2 seconds.     Findings: No bruising, erythema or rash.  Neurological:     Mental Status: She is alert.      UC Treatments / Results  Labs (all labs ordered are listed, but only abnormal results are displayed) Labs Reviewed - No data to display  EKG   Radiology No results found.  Procedures Procedures (including critical care time)  Medications Ordered in UC Medications - No data to display  Initial Impression / Assessment and Plan / UC Course  I have reviewed the triage vital signs and the nursing notes.  Pertinent labs & imaging results that were available during my care of the patient were reviewed by me and considered in my medical decision making (see chart for details).     1.  Carpal tunnel on the right wrist: Patient completed a course of prednisone with no improvement in symptoms Orthopedic surgery referral Ibuprofen 600 mg every 6 hours as needed for pain Return precautions given Patient will go to the  [facility] urgent care this evening for evaluation. Final Clinical Impressions(s) / UC  Diagnoses   Final diagnoses:  Carpal tunnel syndrome of right wrist   Discharge Instructions   None    ED Prescriptions    Medication Sig Dispense Auth. Provider   ibuprofen (ADVIL) 600 MG tablet Take 1 tablet (600 mg total) by mouth every 6 (six) hours as needed. 30 tablet Lyndzie Zentz, Myrene Galas, MD     PDMP not reviewed this encounter.   Chase Picket, MD 05/31/19 [phone]

## 2019-05-31 NOTE — ED Triage Notes (Addendum)
Pt is here following up from her 05/26/2019 visit, states she was told to come back if pain didn't get any better, states the meds have relieve it some. Pt has taken the prescribed meds to relieve discomfort.

## 2019-06-24 ENCOUNTER — Ambulatory Visit: Payer: PRIVATE HEALTH INSURANCE | Admitting: Nurse Practitioner

## 2019-09-10 ENCOUNTER — Encounter: Payer: Self-pay | Admitting: Nurse Practitioner

## 2020-01-22 DIAGNOSIS — F419 Anxiety disorder, unspecified: Secondary | ICD-10-CM

## 2020-01-22 HISTORY — DX: Anxiety disorder, unspecified: F41.9

## 2020-02-28 ENCOUNTER — Ambulatory Visit (HOSPITAL_COMMUNITY)
Admission: EM | Admit: 2020-02-28 | Discharge: 2020-02-28 | Disposition: A | Discharge status: Home / Self Care | Payer: PRIVATE HEALTH INSURANCE

## 2020-02-28 ENCOUNTER — Encounter (HOSPITAL_COMMUNITY): Payer: Self-pay

## 2020-02-28 DIAGNOSIS — R030 Elevated blood-pressure reading, without diagnosis of hypertension: Secondary | ICD-10-CM | POA: Diagnosis not present

## 2020-02-28 DIAGNOSIS — R519 Headache, unspecified: Secondary | ICD-10-CM

## 2020-02-28 NOTE — ED Triage Notes (Signed)
Pt c/o high blood pressure. Pt states she has been on and off HBP medication. She states her BP has been high since Thursday. She states the highest it has been was 180/110. Pt states she has been having headaches and feeling tension in her neck.

## 2020-02-28 NOTE — ED Provider Notes (Signed)
[place]    CSN: [id number] Arrival date & time: 02/28/20  1104      History   Chief Complaint Chief Complaint  Patient presents with  . Hypertension    HPI Sharon Bond is a 55 y.o. female.   Patient presents with concern for her elevated blood pressure readings at home.  She states her blood pressure has been elevated for the past 5 to 6 days and has been as high as 180/110.  She also reports headache.  She states she has been having a lot of stress lately.  She denies numbness, weakness, dizziness, chest pain, shortness of breath, lower extremity edema, or other symptoms.  Her medical history includes hypertension, hypokalemia, fibromyalgia, GERD, anxiety.  The history is provided by the patient and medical records.    Past Medical History:  Diagnosis Date  . Eczema   . Hypertension   . IUD (intrauterine device) in place 2005   removed 2015  . Mixed incontinence   . Seasonal allergic rhinitis   . Wears glasses     Patient Active Problem List   Diagnosis Date Noted  . Elevated blood pressure 10/07/2012  . HYPOKALEMIA 03/20/2006  . ANXIETY 03/20/2006  . HYPERTENSION, BENIGN SYSTEMIC 03/20/2006  . RHINITIS, ALLERGIC 03/20/2006  . GASTROESOPHAGEAL REFLUX, NO ESOPHAGITIS 03/20/2006  . FIBROMYALGIA, FIBROMYOSITIS 03/20/2006  . MUSCLE CRAMPS NOS 03/20/2006    Past Surgical History:  Procedure Laterality Date  . DILATATION & CURETTAGE/HYSTEROSCOPY WITH TRUECLEAR N/A 01/08/2013   Procedure: DILATATION & CURETTAGE/HYSTEROSCOPY WITH TRUCLEAR, polypectomy;  Surgeon: Claiborne Billings A. Pamala Hurry, MD;  Location: [facility];  Service: Gynecology;  Laterality: N/A;  . LAPAROSCOPIC TUBAL LIGATION Bilateral 01/08/2013   Procedure: DIAGNOSTIC LAPAROSCOPY Bilateral Salpingectomy;  Surgeon: Claiborne Billings A. Pamala Hurry, MD;  Location: [facility];  Service: Gynecology;  Laterality: Bilateral;  . NO PAST SURGERIES    . WISDOM TOOTH EXTRACTION      OB History    Gravida  4   Para  4   Term  3    Preterm  1   AB      Living  2     SAB      IAB      Ectopic      Multiple      Live Births  2            Home Medications    Prior to Admission medications   Medication Sig Start Date End Date Taking? Authorizing Provider  hydrochlorothiazide (HYDRODIURIL) 25 MG tablet Take 1 tablet (25 mg total) by mouth daily. 04/29/19   [name], FNP  ibuprofen (ADVIL) 600 MG tablet Take 1 tablet (600 mg total) by mouth every 6 (six) hours as needed. 05/31/19   LampteyMyrene Galas, MD  triamcinolone cream (KENALOG) 0.1 % Apply 1 application topically as needed. Pt using at least 4 times a day 04/29/19   [name], FNP    Family History Family History  Problem Relation Age of Onset  . Diabetes Mother   . Hypertension Mother   . Healthy Father   . Diabetes Brother   . Heart disease Maternal Aunt   . Cancer Neg Hx   . Stroke Neg Hx     Social History Social History   Tobacco Use  . Smoking status: Never Smoker  . Smokeless tobacco: Never Used  Substance Use Topics  . Alcohol use: Yes    Comment: occasionally  . Drug use: No     Allergies  Penicillins and Amlodipine   Review of Systems Review of Systems  Constitutional: Negative for chills and fever.  HENT: Negative for ear pain and sore throat.   Eyes: Negative for pain and visual disturbance.  Respiratory: Negative for cough and shortness of breath.   Cardiovascular: Negative for chest pain, palpitations and leg swelling.  Gastrointestinal: Negative for abdominal pain and vomiting.  Genitourinary: Negative for dysuria and hematuria.  Musculoskeletal: Negative for arthralgias and back pain.  Skin: Negative for color change and rash.  Neurological: Positive for headaches. Negative for dizziness, syncope, weakness and numbness.  All other systems reviewed and are negative.    Physical Exam Triage Vital Signs ED Triage Vitals  Enc Vitals Group     BP      Pulse      Resp      Temp      Temp src       SpO2      Weight      Height      Head Circumference      Peak Flow      Pain Score      Pain Loc      Pain Edu?      Excl. in [place]?    No data found.  Updated Vital Signs BP (!) 160/73 (BP Location: Left Arm)   Pulse 95   Temp 99.1 F (37.3 C) (Oral)   Resp 17   LMP 11/28/2012   SpO2 100%   Visual Acuity Right Eye Distance:   Left Eye Distance:   Bilateral Distance:    Right Eye Near:   Left Eye Near:    Bilateral Near:     Physical Exam Vitals and nursing note reviewed.  Constitutional:      General: She is not in acute distress.    Appearance: She is well-developed and well-nourished. She is obese. She is not ill-appearing.  HENT:     Head: Normocephalic and atraumatic.     Mouth/Throat:     Mouth: Mucous membranes are moist.  Eyes:     Conjunctiva/sclera: Conjunctivae normal.  Cardiovascular:     Rate and Rhythm: Normal rate and regular rhythm.     Heart sounds: Normal heart sounds.  Pulmonary:     Effort: Pulmonary effort is normal. No respiratory distress.     Breath sounds: Normal breath sounds.  Abdominal:     Palpations: Abdomen is soft.     Tenderness: There is no abdominal tenderness.  Musculoskeletal:        General: No edema.     Cervical back: Neck supple.     Right lower leg: No edema.     Left lower leg: No edema.  Skin:    General: Skin is warm and dry.  Neurological:     General: No focal deficit present.     Mental Status: She is alert and oriented to person, place, and time.     Sensory: No sensory deficit.     Motor: No weakness.     Gait: Gait normal.  Psychiatric:        Mood and Affect: Mood and affect and mood normal.        Behavior: Behavior normal.      UC Treatments / Results  Labs (all labs ordered are listed, but only abnormal results are displayed) Labs Reviewed - No data to display  EKG   Radiology No results found.  Procedures Procedures (including critical care time)  Medications Ordered  in  UC Medications - No data to display  Initial Impression / Assessment and Plan / UC Course  I have reviewed the triage vital signs and the nursing notes.  Pertinent labs & imaging results that were available during my care of the patient were reviewed by me and considered in my medical decision making (see chart for details).   Elevated blood pressure reading.  Acute non-intractable headache.  Patient is well-appearing and her exam is reassuring.  Her blood pressure is 160/73 today.  She was last seen by her PCP in April 2021.  Instructed her to call her PCPs office to schedule an appointment.  Discussed that she should continue to monitor her blood pressure at home and keep a log of the readings to show her PCP.  Education provided on hypertension and also on headaches.  Patient agrees to plan of care.   Final Clinical Impressions(s) / UC Diagnoses   Final diagnoses:  Elevated blood pressure reading  Acute nonintractable headache, unspecified headache type     Discharge Instructions     Take Tylenol or ibuprofen as needed for discomfort.   Your blood pressure is elevated today at 160/73.  Please have this rechecked by your primary care provider in 2-4 weeks.         ED Prescriptions    None     PDMP not reviewed this encounter.   Sharion Balloon, NP 02/28/20 1323

## 2020-02-28 NOTE — Discharge Instructions (Addendum)
Take Tylenol or ibuprofen as needed for discomfort.   Your blood pressure is elevated today at 160/73.  Please have this rechecked by your primary care provider in 2-4 weeks.

## 2020-03-02 ENCOUNTER — Other Ambulatory Visit: Payer: Self-pay

## 2020-03-02 ENCOUNTER — Ambulatory Visit (INDEPENDENT_AMBULATORY_CARE_PROVIDER_SITE_OTHER): Payer: Medicaid Other | Admitting: Nurse Practitioner

## 2020-03-02 ENCOUNTER — Encounter: Payer: Self-pay | Admitting: Nurse Practitioner

## 2020-03-02 VITALS — BP 142/106 | HR 86 | Temp 98.9°F | Ht 63.4 in | Wt 228.8 lb

## 2020-03-02 DIAGNOSIS — R7309 Other abnormal glucose: Secondary | ICD-10-CM

## 2020-03-02 DIAGNOSIS — I10 Essential (primary) hypertension: Secondary | ICD-10-CM | POA: Diagnosis not present

## 2020-03-02 DIAGNOSIS — Z6841 Body Mass Index (BMI) 40.0 and over, adult: Secondary | ICD-10-CM

## 2020-03-02 DIAGNOSIS — M79601 Pain in right arm: Secondary | ICD-10-CM

## 2020-03-02 MED ORDER — HYDROCHLOROTHIAZIDE 25 MG PO TABS: 25.0000 mg | ORAL_TABLET | Freq: Every day | ORAL | 0 refills | Status: DC

## 2020-03-02 MED ORDER — DICLOFENAC SODIUM 1 % EX GEL: 2.0000 g | Freq: Four times a day (QID) | CUTANEOUS | 2 refills | Status: DC

## 2020-03-02 NOTE — Patient Instructions (Addendum)

## 2020-03-02 NOTE — Progress Notes (Signed)
I,Yamilka Roman [employer] as a Education administrator for [employer], FNP.,have documented all relevant documentation on the behalf of [name], FNP,as directed by  [name], FNP while in the presence of [name], [place]. This visit occurred during the SARS-CoV-2 public health emergency.  Safety protocols were in place, including screening questions prior to the visit, additional usage of staff PPE, and extensive cleaning of exam room while observing appropriate contact time as indicated for disinfecting solutions.  Subjective:     Patient ID: Sharon Bond , female    DOB: [birth year] , 55 y.o.   MRN: [id number]   Chief Complaint  Patient presents with  . Hypertension    Patient stated her blood pressure was really high over the weekend which is why she went to the hospital.     HPI  Patient presents today for a blood pressure f/u. She reports she has lost 2 jobs and was injured at one of her most recent jobs. Went to urgent care on 2/7.    Wt Readings from Last 3 Encounters: 03/02/20 : 228 lb 12.8 oz (103.8 kg) 04/29/19 : 212 lb 9.6 oz (96.4 kg) 10/28/14 : 210 lb (95.3 kg)  Hypertension This is a chronic problem. The current episode started 1 to 4 weeks ago (last 2 weeks on Thursday felt pressure in her ears. she was in the bed all day.). The problem is uncontrolled. Associated symptoms include headaches. There are no associated agents to hypertension. Risk factors for coronary artery disease include obesity, sedentary lifestyle and stress (she is starting back into exercising). Compliance problems: routine follow up.  There is no history of angina or CAD/MI. There is no history of chronic renal disease.     Past Medical History:  Diagnosis Date  . Eczema   . Hypertension   . IUD (intrauterine device) in place 2005   removed 2015  . Mixed incontinence   . Seasonal allergic rhinitis   . Wears glasses      Family History  Problem Relation Age of Onset  . Diabetes Mother   .  Hypertension Mother   . Healthy Father   . Diabetes Brother   . Heart disease Maternal Aunt   . Cancer Neg Hx   . Stroke Neg Hx      Current Outpatient Medications:  .  diclofenac Sodium (VOLTAREN) 1 % GEL, Apply 2 g topically 4 (four) times daily., Disp: 100 g, Rfl: 2 .  ibuprofen (ADVIL) 600 MG tablet, Take 1 tablet (600 mg total) by mouth every 6 (six) hours as needed., Disp: 30 tablet, Rfl: 0 .  triamcinolone cream (KENALOG) 0.1 %, Apply 1 application topically as needed. Pt using at least 4 times a day, Disp: 30 g, Rfl: 2 .  hydrochlorothiazide (HYDRODIURIL) 25 MG tablet, Take 1 tablet (25 mg total) by mouth daily., Disp: 90 tablet, Rfl: 0   Allergies  Allergen Reactions  . Penicillins Swelling and Other (See Comments)    Facial swelling  . Amlodipine Cough     Review of Systems  Constitutional: Negative.   HENT: Negative.   Eyes: Negative.   Respiratory: Negative.   Cardiovascular: Negative.   Gastrointestinal: Negative.   Endocrine: Negative.   Genitourinary: Negative.   Musculoskeletal: Negative.   Skin: Negative.   Neurological: Positive for headaches.  Hematological: Negative.   Psychiatric/Behavioral: Negative.      Today's Vitals   03/02/20 1621  BP: (!) 142/106  Pulse: 86  Temp: 98.9 F (37.2 C)  TempSrc:  Oral  Weight: 228 lb 12.8 oz (103.8 kg)  Height: 5' 3.4" (1.61 m)  PainSc: 5   PainLoc: Head   Body mass index is 40.02 kg/m.   Objective:  Physical Exam Constitutional:      Appearance: Normal appearance. She is obese.  Cardiovascular:     Rate and Rhythm: Normal rate and regular rhythm.     Pulses: Normal pulses.     Heart sounds: Normal heart sounds.  Pulmonary:     Effort: Pulmonary effort is normal.     Breath sounds: Normal breath sounds.  Musculoskeletal:        General: Normal range of motion.     Cervical back: Normal range of motion and neck supple.  Skin:    General: Skin is warm and dry.     Capillary Refill: Capillary  refill takes less than 2 seconds.  Neurological:     General: No focal deficit present.     Mental Status: She is alert and oriented to person, place, and time.  Psychiatric:        Mood and Affect: Mood normal.        Behavior: Behavior normal.        Thought Content: Thought content normal.        Judgment: Judgment normal.         Assessment And Plan:     1. Essential hypertension  Chronic, blood pressure remains elevated  Will get her back on her HCTZ, increase intake of potassium containing foods  She is encouraged to focus on weight loss   Avoid high salt foods - BMP8+eGFR - hydrochlorothiazide (HYDRODIURIL) 25 MG tablet; Take 1 tablet (25 mg total) by mouth daily.  Dispense: 90 tablet; Refill: 0  2. Abnormal glucose  Chronic, controlled  Continue with current medications  Encouraged to limit intake of sugary foods and drinks  Encouraged to increase physical activity to 150 minutes per week  Diabetic foot exam done, no abnormal findings  Eye exam is up to date - Hemoglobin A1c  3. Right arm pain - diclofenac Sodium (VOLTAREN) 1 % GEL; Apply 2 g topically 4 (four) times daily.  Dispense: 100 g; Refill: 2  4. Class 3 severe obesity due to excess calories with serious comorbidity and body mass index (BMI) of 40.0 to 44.9 in adult [facility])  Chronic  Discussed healthy diet and regular exercise options   Encouraged to exercise at least 150 minutes per week with 2 days of strength training  We may need to have more extensive conversations about her weight as this could be affecting her blood pressure  Patient was given opportunity to ask questions. Patient verbalized understanding of the plan and was able to repeat key elements of the plan. All questions were answered to their satisfaction.  [name], FNP   I, [name], FNP, have reviewed all documentation for this visit. The documentation on 03/05/20 for the exam, diagnosis, procedures, and orders are  all accurate and complete.   THE PATIENT IS ENCOURAGED TO PRACTICE SOCIAL DISTANCING DUE TO THE COVID-19 PANDEMIC.

## 2020-03-08 ENCOUNTER — Telehealth: Payer: Self-pay

## 2020-03-08 NOTE — Telephone Encounter (Signed)
I called patient to see if she was able to come to the office to get her bloodwork done she did not stop by labs when she was here [facility]

## 2020-03-09 ENCOUNTER — Other Ambulatory Visit: Payer: Self-pay

## 2020-03-09 ENCOUNTER — Other Ambulatory Visit: Payer: Self-pay | Admitting: Nurse Practitioner

## 2020-03-09 ENCOUNTER — Other Ambulatory Visit: Payer: PRIVATE HEALTH INSURANCE

## 2020-03-09 DIAGNOSIS — E876 Hypokalemia: Secondary | ICD-10-CM

## 2020-03-09 DIAGNOSIS — R7309 Other abnormal glucose: Secondary | ICD-10-CM

## 2020-03-09 LAB — BMP8+EGFR
BUN/Creatinine Ratio: 16 (ref 9–23)
BUN: 12 mg/dL (ref 6–24)
CO2: 30 mmol/L — ABNORMAL HIGH (ref 20–29)
Calcium: 9.5 mg/dL (ref 8.7–10.2)
Chloride: 97 mmol/L (ref 96–106)
Creatinine, Ser: 0.77 mg/dL (ref 0.57–1.00)
GFR calc Af Amer: 102 mL/min/{1.73_m2} (ref >59)
GFR calc non Af Amer: 88 mL/min/{1.73_m2} (ref >59)
Glucose: 99 mg/dL (ref 65–99)
Potassium: 3.3 mmol/L — ABNORMAL LOW (ref 3.5–5.2)
Sodium: 142 mmol/L (ref 134–144)

## 2020-03-09 MED ORDER — POTASSIUM CHLORIDE ER 10 MEQ PO TBCR: 10.0000 meq | EXTENDED_RELEASE_TABLET | Freq: Every day | ORAL | 1 refills | Status: DC

## 2020-03-10 LAB — HEMOGLOBIN A1C
Est. average glucose Bld gHb Est-mCnc: 120 mg/dL
Hgb A1c MFr Bld: 5.8 % — ABNORMAL HIGH (ref 4.8–5.6)

## 2020-03-14 ENCOUNTER — Other Ambulatory Visit: Payer: Self-pay

## 2020-03-14 ENCOUNTER — Ambulatory Visit: Payer: Medicaid Other

## 2020-03-14 ENCOUNTER — Other Ambulatory Visit: Payer: Self-pay | Admitting: Nurse Practitioner

## 2020-03-14 VITALS — BP 150/100 | Temp 98.0°F | Ht 63.4 in | Wt 225.4 lb

## 2020-03-14 DIAGNOSIS — I10 Essential (primary) hypertension: Secondary | ICD-10-CM

## 2020-03-14 MED ORDER — OLMESARTAN MEDOXOMIL 5 MG PO TABS: 5.0000 mg | ORAL_TABLET | Freq: Every day | ORAL | 2 refills | Status: DC

## 2020-03-14 NOTE — Progress Notes (Signed)
BP Readings from Last 3 Encounters:  03/14/20 (!) 150/100  03/02/20 (!) 142/106  02/28/20 (!) 160/73    Pt was seen here today for a BPC. Pt has been taking current medications as directed but not exercising as frequently. Amlodipine 5mg  has been sent to pt pharmacy to take as directed stated by provider.

## 2020-03-23 ENCOUNTER — Ambulatory Visit (INDEPENDENT_AMBULATORY_CARE_PROVIDER_SITE_OTHER): Payer: PRIVATE HEALTH INSURANCE | Admitting: Nurse Practitioner

## 2020-03-23 ENCOUNTER — Encounter: Payer: Self-pay | Admitting: Nurse Practitioner

## 2020-03-23 ENCOUNTER — Other Ambulatory Visit: Payer: Self-pay

## 2020-03-23 VITALS — BP 132/84 | HR 101 | Temp 98.1°F | Ht 63.4 in | Wt 228.0 lb

## 2020-03-23 DIAGNOSIS — R059 Cough, unspecified: Secondary | ICD-10-CM

## 2020-03-23 DIAGNOSIS — Z9189 Other specified personal risk factors, not elsewhere classified: Secondary | ICD-10-CM | POA: Diagnosis not present

## 2020-03-23 DIAGNOSIS — I10 Essential (primary) hypertension: Secondary | ICD-10-CM

## 2020-03-23 MED ORDER — TRIAMCINOLONE ACETONIDE 40 MG/ML IJ SUSP
40.0000 mg | Freq: Once | INTRAMUSCULAR | Status: AC
  Administered 2020-03-23: 40 mg via INTRAMUSCULAR

## 2020-03-23 MED ORDER — MAGNESIUM 250 MG PO TABS: 1.0000 | ORAL_TABLET | Freq: Every day | ORAL | 2 refills | Status: DC

## 2020-03-23 MED ORDER — EPINEPHRINE 0.3 MG/0.3ML IJ SOAJ: 0.3000 mg | INTRAMUSCULAR | 1 refills | Status: AC | PRN

## 2020-03-23 MED ORDER — ALBUTEROL SULFATE HFA 108 (90 BASE) MCG/ACT IN AERS: 2.0000 | INHALATION_SPRAY | Freq: Four times a day (QID) | RESPIRATORY_TRACT | 2 refills | Status: DC | PRN

## 2020-03-23 NOTE — Progress Notes (Signed)
Rutherford Nail as a scribe for [name], FNP.,have documented all relevant documentation on the behalf of [name], FNP,as directed by  [name], FNP while in the presence of [name], [place]. This visit occurred during the SARS-CoV-2 public health emergency.  Safety protocols were in place, including screening questions prior to the visit, additional usage of staff PPE, and extensive cleaning of exam room while observing appropriate contact time as indicated for disinfecting solutions.  Subjective:     Patient ID: Sharon Bond , female    DOB: 16-Dec-1965 , 55 y.o.   MRN: [id number]   Chief Complaint  Patient presents with  . Medication Reaction    Patient stated she thinks she may be allergic to the olmesartan. She has been coughing and having SOB.     HPI  Patient presents today for an eval for a cough and sob she feels she is allergic to olmesartan.  Started over the weekend. When she lays down she feels like she is drowning, dry cough reported. When she is walking she is having shortness of breath and cough.     Past Medical History:  Diagnosis Date  . Eczema   . Hypertension   . IUD (intrauterine device) in place 2005   removed 2015  . Mixed incontinence   . Seasonal allergic rhinitis   . Wears glasses      Family History  Problem Relation Age of Onset  . Diabetes Mother   . Hypertension Mother   . Healthy Father   . Diabetes Brother   . Heart disease Maternal Aunt   . Cancer Neg Hx   . Stroke Neg Hx      Current Outpatient Medications:  .  albuterol (VENTOLIN HFA) 108 (90 Base) MCG/ACT inhaler, Inhale 2 puffs into the lungs every 6 (six) hours as needed for wheezing or shortness of breath., Disp: 8 g, Rfl: 2 .  diclofenac Sodium (VOLTAREN) 1 % GEL, Apply 2 g topically 4 (four) times daily., Disp: 100 g, Rfl: 2 .  EPINEPHrine 0.3 mg/0.3 mL IJ SOAJ injection, Inject 0.3 mg into the muscle as needed for anaphylaxis., Disp: 1 each, Rfl: 1 .   hydrochlorothiazide (HYDRODIURIL) 25 MG tablet, Take 1 tablet (25 mg total) by mouth daily., Disp: 90 tablet, Rfl: 0 .  Magnesium 250 MG TABS, Take 1 tablet (250 mg total) by mouth daily. With evening meal, Disp: 30 tablet, Rfl: 2 .  potassium chloride (KLOR-CON) 10 MEQ tablet, Take 1 tablet (10 mEq total) by mouth daily., Disp: 30 tablet, Rfl: 1 .  ibuprofen (ADVIL) 600 MG tablet, Take 1 tablet (600 mg total) by mouth every 6 (six) hours as needed. (Patient not taking: No sig reported), Disp: 30 tablet, Rfl: 0 .  triamcinolone cream (KENALOG) 0.1 %, Apply 1 application topically as needed. Pt using at least 4 times a day (Patient not taking: No sig reported), Disp: 30 g, Rfl: 2   Allergies  Allergen Reactions  . Penicillins Swelling and Other (See Comments)    Facial swelling  . Amlodipine Cough     Review of Systems  Constitutional: Negative.   HENT: Negative.   Eyes: Negative.   Respiratory: Positive for cough, chest tightness and shortness of breath. Negative for wheezing.   Cardiovascular: Negative.  Negative for chest pain, palpitations and leg swelling.  Gastrointestinal: Negative.   Endocrine: Negative.   Genitourinary: Negative.   Musculoskeletal: Negative.   Skin: Negative.   Neurological: Negative.   Hematological: Negative.  Psychiatric/Behavioral: Negative.      Today's Vitals   03/23/20 1619  BP: 132/84  Pulse: (!) 101  Temp: 98.1 F (36.7 C)  TempSrc: Oral  Weight: 228 lb (103.4 kg)  Height: 5' 3.4" (1.61 m)  PainSc: 0-No pain   Body mass index is 39.88 kg/m.   Objective:  Physical Exam Constitutional:      General: She is not in acute distress.    Appearance: Normal appearance. She is obese.  Cardiovascular:     Rate and Rhythm: Normal rate and regular rhythm.     Pulses: Normal pulses.     Heart sounds: Normal heart sounds. No murmur heard.   Pulmonary:     Effort: Pulmonary effort is normal. No respiratory distress.     Breath sounds: Normal  breath sounds. No wheezing.  Abdominal:     General: Abdomen is flat. Bowel sounds are normal.     Palpations: Abdomen is soft.  Musculoskeletal:        General: Normal range of motion.     Cervical back: Normal range of motion and neck supple.  Skin:    General: Skin is warm and dry.     Capillary Refill: Capillary refill takes less than 2 seconds.  Neurological:     General: No focal deficit present.     Mental Status: She is alert and oriented to person, place, and time.  Psychiatric:        Mood and Affect: Mood normal.        Behavior: Behavior normal.        Thought Content: Thought content normal.        Judgment: Judgment normal.         Assessment And Plan:     1. Essential hypertension  Chronic, her blood pressure is fair today however I am discontinuing her amlodipine due to patient reporting a new cough which is not often an adverse reaction but due to her history with other blood pressure medications causing this I will refer her the Hypertension clinic as she will need a medication for better control.  2. Cough  Will check for covid  Asthma vs medication allergy - albuterol (VENTOLIN HFA) 108 (90 Base) MCG/ACT inhaler; Inhale 2 puffs into the lungs every 6 (six) hours as needed for wheezing or shortness of breath.  Dispense: 8 g; Refill: 2 - triamcinolone acetonide (KENALOG-40) injection 40 mg - Ambulatory referral to [facility] [place] - Novel Coronavirus, NAA ([facility]) - EPINEPHrine 0.3 mg/0.3 mL IJ SOAJ injection; Inject 0.3 mg into the muscle as needed for anaphylaxis.  Dispense: 1 each; Refill: 1  3. At risk for allergic reaction to medication - EPINEPHrine 0.3 mg/0.3 mL IJ SOAJ injection; Inject 0.3 mg into the muscle as needed for anaphylaxis.  Dispense: 1 each; Refill: 1     Patient was given opportunity to ask questions. Patient verbalized understanding of the plan and was able to repeat key elements of the plan. All  questions were answered to their satisfaction.  [name], FNP   I, [name], FNP, have reviewed all documentation for this visit. The documentation on 03/29/20 for the exam, diagnosis, procedures, and orders are all accurate and complete.   THE PATIENT IS ENCOURAGED TO PRACTICE SOCIAL DISTANCING DUE TO THE COVID-19 PANDEMIC.

## 2020-03-24 LAB — NOVEL CORONAVIRUS, NAA: SARS-CoV-2, NAA: NOT DETECTED

## 2020-03-24 LAB — SARS-COV-2, NAA 2 DAY TAT

## 2020-03-28 ENCOUNTER — Other Ambulatory Visit: Payer: PRIVATE HEALTH INSURANCE

## 2020-03-28 ENCOUNTER — Ambulatory Visit (INDEPENDENT_AMBULATORY_CARE_PROVIDER_SITE_OTHER): Payer: Medicaid Other | Admitting: Nurse Practitioner

## 2020-03-28 ENCOUNTER — Other Ambulatory Visit: Payer: Self-pay

## 2020-03-28 ENCOUNTER — Encounter: Payer: Self-pay | Admitting: Nurse Practitioner

## 2020-03-28 VITALS — BP 148/90 | HR 91 | Temp 98.3°F | Ht 63.4 in | Wt 225.4 lb

## 2020-03-28 DIAGNOSIS — R0600 Dyspnea, unspecified: Secondary | ICD-10-CM

## 2020-03-28 DIAGNOSIS — I10 Essential (primary) hypertension: Secondary | ICD-10-CM | POA: Diagnosis not present

## 2020-03-28 DIAGNOSIS — R002 Palpitations: Secondary | ICD-10-CM

## 2020-03-28 DIAGNOSIS — R0609 Other forms of dyspnea: Secondary | ICD-10-CM

## 2020-03-28 DIAGNOSIS — Z1159 Encounter for screening for other viral diseases: Secondary | ICD-10-CM | POA: Diagnosis not present

## 2020-03-28 DIAGNOSIS — R059 Cough, unspecified: Secondary | ICD-10-CM

## 2020-03-28 DIAGNOSIS — Z1231 Encounter for screening mammogram for malignant neoplasm of breast: Secondary | ICD-10-CM

## 2020-03-28 NOTE — Patient Instructions (Signed)

## 2020-03-28 NOTE — Progress Notes (Signed)
I,Yamilka Roman [employer] as a Education administrator for [employer], FNP.,have documented all relevant documentation on the behalf of [name], FNP,as directed by  [name], FNP while in the presence of [name], [place]. This visit occurred during the SARS-CoV-2 public health emergency.  Safety protocols were in place, including screening questions prior to the visit, additional usage of staff PPE, and extensive cleaning of exam room while observing appropriate contact time as indicated for disinfecting solutions.  Subjective:     Patient ID: Sharon Bond , female    DOB: [birth year] , 55 y.o.   MRN: [id number]   Chief Complaint  Patient presents with  . Cough    Patient stated she is doing better she is no longer coughing.  Marland Kitchen Hypertension    HPI  Patient presents today for a f/u on her cough. She stated it has resolved. She also presents today for a f/u on her blood pressure. She continues to have heart flutters. She was having chest pressure prior to starting the medication but then developed a cough. She has not had any caffeine since 2/3.  Her maternal aunt deceased with CHF at the age of 27 years old. She has a mild headache. She is not taking any tylenol or aleve on a regular basis.  When she tries to walk she feels like she has ran. She has been using the albuterol inhaler as needed with some relief  Wt Readings from Last 3 Encounters: 03/28/20 : 225 lb 6.4 oz (102.2 kg) 03/23/20 : 228 lb (103.4 kg) 03/14/20 : 225 lb 6.4 oz (102.2 kg)  Cough This is a new problem. The current episode started more than 1 year ago. Associated symptoms include shortness of breath. Pertinent negatives include no wheezing. Treatments tried: stopped taking amlodipine.     Past Medical History:  Diagnosis Date  . Eczema   . Hypertension   . IUD (intrauterine device) in place 2005   removed 2015  . Mixed incontinence   . Seasonal allergic rhinitis   . Wears glasses      Family History  Problem  Relation Age of Onset  . Diabetes Mother   . Hypertension Mother   . Healthy Father   . Diabetes Brother   . Heart disease Maternal Aunt   . Cancer Neg Hx   . Stroke Neg Hx      Current Outpatient Medications:  .  albuterol (VENTOLIN HFA) 108 (90 Base) MCG/ACT inhaler, Inhale 2 puffs into the lungs every 6 (six) hours as needed for wheezing or shortness of breath., Disp: 8 g, Rfl: 2 .  diclofenac Sodium (VOLTAREN) 1 % GEL, Apply 2 g topically 4 (four) times daily., Disp: 100 g, Rfl: 2 .  EPINEPHrine 0.3 mg/0.3 mL IJ SOAJ injection, Inject 0.3 mg into the muscle as needed for anaphylaxis., Disp: 1 each, Rfl: 1 .  hydrochlorothiazide (HYDRODIURIL) 25 MG tablet, Take 1 tablet (25 mg total) by mouth daily., Disp: 90 tablet, Rfl: 0 .  Magnesium 250 MG TABS, Take 1 tablet (250 mg total) by mouth daily. With evening meal, Disp: 30 tablet, Rfl: 2 .  potassium chloride (KLOR-CON) 10 MEQ tablet, Take 1 tablet (10 mEq total) by mouth daily., Disp: 30 tablet, Rfl: 1 .  ibuprofen (ADVIL) 600 MG tablet, Take 1 tablet (600 mg total) by mouth every 6 (six) hours as needed. (Patient not taking: No sig reported), Disp: 30 tablet, Rfl: 0 .  triamcinolone cream (KENALOG) 0.1 %, Apply 1 application topically as  needed. Pt using at least 4 times a day (Patient not taking: No sig reported), Disp: 30 g, Rfl: 2   Allergies  Allergen Reactions  . Penicillins Swelling and Other (See Comments)    Facial swelling  . Amlodipine Cough     Review of Systems  Constitutional: Negative.   HENT: Negative.   Eyes: Negative.   Respiratory: Positive for shortness of breath. Negative for cough and wheezing.   Cardiovascular: Negative.   Gastrointestinal: Negative.   Endocrine: Negative.   Genitourinary: Negative.   Musculoskeletal: Negative.   Skin: Negative.   Neurological: Negative.   Hematological: Negative.   Psychiatric/Behavioral: Negative.      Today's Vitals   03/28/20 1036  BP: (!) 148/90  Pulse: 91   Temp: 98.3 F (36.8 C)  TempSrc: Oral  Weight: 225 lb 6.4 oz (102.2 kg)  Height: 5' 3.4" (1.61 m)  PainSc: 0-No pain   Body mass index is 39.43 kg/m.   Objective:  Physical Exam Constitutional:      General: She is not in acute distress.    Appearance: Normal appearance. She is obese.  Cardiovascular:     Rate and Rhythm: Normal rate and regular rhythm.     Pulses: Normal pulses.     Heart sounds: Normal heart sounds.  Pulmonary:     Effort: Pulmonary effort is normal. No respiratory distress.     Breath sounds: Normal breath sounds.  Musculoskeletal:        General: Normal range of motion.     Cervical back: Normal range of motion and neck supple.  Skin:    General: Skin is warm and dry.     Capillary Refill: Capillary refill takes less than 2 seconds.  Neurological:     General: No focal deficit present.     Mental Status: She is alert and oriented to person, place, and time.     Cranial Nerves: No cranial nerve deficit.     Motor: No weakness.  Psychiatric:        Mood and Affect: Mood normal.        Behavior: Behavior normal.        Thought Content: Thought content normal.        Judgment: Judgment normal.         Assessment And Plan:     1. Cough  She feels this has mostly resolved.   Will check BNP due to having dyspnea as well. - Brain natriuretic peptide - BMP8+eGFR  2. Essential hypertension . B/P is controlled.  . CMP ordered to check renal function.  . The importance of regular exercise and dietary modification was stressed to the patient.  . EKG done with SR with right atrial enlargement. . She is waiting for her appt with the hypertension clinic - EKG 12-Lead  3. Palpitations EKG done HR 86 no irregular rhythm - Brain natriuretic peptide - DG Chest 2 View; Future - EKG 12-Lead  4. Dyspnea on exertion Will check for fluid overload and CXR  - Brain natriuretic peptide - DG Chest 2 View; Future - CBC - EKG 12-Lead  5. Encounter for  hepatitis C screening test for low risk patient  Will check Hepatitis C screening due to recent recommendations to screen all adults 18 years and older - Hepatitis C antibody   6. Encounter for screening mammogram for malignant neoplasm of breast  Pt instructed on Self Breast Exam.According to ACOG guidelines Women aged 2 and older are recommended to get an  annual mammogram. [facility].   Pt encouraged to get annual mammogram - MM Digital Screening; Future  Patient was given opportunity to ask questions. Patient verbalized understanding of the plan and was able to repeat key elements of the plan. All questions were answered to their satisfaction.  [name], FNP   I, [name], FNP, have reviewed all documentation for this visit. The documentation on 03/28/20 for the exam, diagnosis, procedures, and orders are all accurate and complete.   THE PATIENT IS ENCOURAGED TO PRACTICE SOCIAL DISTANCING DUE TO THE COVID-19 PANDEMIC.

## 2020-03-29 LAB — CBC
Hematocrit: 42.6 % (ref 34.0–46.6)
Hemoglobin: 14.4 g/dL (ref 11.1–15.9)
MCH: 29.2 pg (ref 26.6–33.0)
MCHC: 33.8 g/dL (ref 31.5–35.7)
MCV: 86 fL (ref 79–97)
Platelets: 242 10*3/uL (ref 150–450)
RBC: 4.93 x10E6/uL (ref 3.77–5.28)
RDW: 12.4 % (ref 11.7–15.4)
WBC: 7.9 10*3/uL (ref 3.4–10.8)

## 2020-03-29 LAB — BMP8+EGFR
BUN/Creatinine Ratio: 15 (ref 9–23)
BUN: 10 mg/dL (ref 6–24)
CO2: 23 mmol/L (ref 20–29)
Calcium: 9.6 mg/dL (ref 8.7–10.2)
Chloride: 102 mmol/L (ref 96–106)
Creatinine, Ser: 0.67 mg/dL (ref 0.57–1.00)
Glucose: 92 mg/dL (ref 65–99)
Potassium: 3.5 mmol/L (ref 3.5–5.2)
Sodium: 144 mmol/L (ref 134–144)
eGFR: 104 mL/min/{1.73_m2} (ref >59)

## 2020-03-29 LAB — HEPATITIS C ANTIBODY: Hep C Virus Ab: <0.1 s/co ratio (ref 0.0–0.9)

## 2020-03-29 LAB — BRAIN NATRIURETIC PEPTIDE: BNP: 6.5 pg/mL (ref 0.0–100.0)

## 2020-03-30 ENCOUNTER — Encounter: Payer: Self-pay | Admitting: Nurse Practitioner

## 2020-04-01 ENCOUNTER — Other Ambulatory Visit: Payer: Self-pay | Admitting: Nurse Practitioner

## 2020-04-01 DIAGNOSIS — E876 Hypokalemia: Secondary | ICD-10-CM

## 2020-04-03 ENCOUNTER — Telehealth: Payer: Self-pay

## 2020-04-03 NOTE — Telephone Encounter (Signed)
Patient called stating she was referred to [facility] but she is not taking patients until July. She wanted to know if it is ok to see another provider.  I returned her call and advised her that it is ok for her to see another provider she stated she did call them back and got an appointment for Thursday. YL,RMA

## 2020-04-03 NOTE — Progress Notes (Signed)
Cardiology Office Note:    Date:  04/06/2020   ID:  Sharon Bond, DOB [birth year], MRN [id number]  PCP:  [name], [place]  Cardiologist:  No primary care provider on file.  Advanced Practice Provider:  No care team member to display Electrophysiologist:  None    Referring MD: [name], FNP    History of Present Illness:    Sharon Bond is a 55 y.o. female with a hx of HTN who was referred by [name], FNP for further evaluation of chest pressure and DOE.  Patient saw [name], FNP on 03/28/20 for cough, DOE, and palpitations. BNP was checked and was normal at 6.5. CBC and BNP normal. She was referred to Cardiology for further evaluation.  Patient states that on 02/24/20, she developed chest heaviness and shortness of breath and has been feeling unwell since that time. No known inciting events or recent illnesses, but just noticed an acute change in her health on that day. She followed up with her PCP where her BP was notably elevated and she was placed on amlodipine. Unfortunately, she developed a bad cough and her breathing worsened and the medication was stopped. Her chest pressure has been ongoing all the time since early Feb and it feels like she has "water in her chest." She did feel a little better on the amlodopine but the cough was not tolerable. Activity makes the symptoms significantly worse. Used to be able to walk 3 miles per day and now she cannot make it up the stairs without stopping. Also has developed headaches and blurry vision at times. Blood pressures have been running high at 170/100s at home on wrist cuff. Has occasional LE edema. Has some difficulty laying flat at night and has slept propped up for the past month. Feels like her heart "beats really hard" with activity and with laying down at night flat which is why she sleeps upright. No prior known of cardiac disease.   Family history: Aunt with CHF (deceased in  5s).  Past Medical History:  Diagnosis Date  . Eczema   . Hypertension   . IUD (intrauterine device) in place 2005   removed 2015  . Mixed incontinence   . Seasonal allergic rhinitis   . Wears glasses     Past Surgical History:  Procedure Laterality Date  . DILATATION & CURETTAGE/HYSTEROSCOPY WITH TRUECLEAR N/A 01/08/2013   Procedure: DILATATION & CURETTAGE/HYSTEROSCOPY WITH TRUCLEAR, polypectomy;  Surgeon: Claiborne Billings A. Pamala Hurry, MD;  Location: [facility];  Service: Gynecology;  Laterality: N/A;  . LAPAROSCOPIC TUBAL LIGATION Bilateral 01/08/2013   Procedure: DIAGNOSTIC LAPAROSCOPY Bilateral Salpingectomy;  Surgeon: Claiborne Billings A. Pamala Hurry, MD;  Location: [facility];  Service: Gynecology;  Laterality: Bilateral;  . NO PAST SURGERIES    . WISDOM TOOTH EXTRACTION      Current Medications: Current Meds  Medication Sig  . albuterol (VENTOLIN HFA) 108 (90 Base) MCG/ACT inhaler Inhale 2 puffs into the lungs every 6 (six) hours as needed for wheezing or shortness of breath.  . diclofenac Sodium (VOLTAREN) 1 % GEL Apply 2 g topically 4 (four) times daily.  Marland Kitchen EPINEPHrine 0.3 mg/0.3 mL IJ SOAJ injection Inject 0.3 mg into the muscle as needed for anaphylaxis.  . hydrochlorothiazide (HYDRODIURIL) 25 MG tablet Take 1 tablet (25 mg total) by mouth daily.  . Magnesium 250 MG TABS Take 1 tablet (250 mg total) by mouth daily. With evening meal  . metoprolol tartrate (LOPRESSOR) 100 MG tablet  Take 1 tablet (100 mg total) by mouth once for 1 dose. Take  2 hours prior to coronary ct  . potassium chloride (KLOR-CON) 10 MEQ tablet TAKE 1 TABLET BY MOUTH EVERY DAY  . valsartan (DIOVAN) 80 MG tablet Take 1 tablet (80 mg total) by mouth daily.  . [DISCONTINUED] metoprolol tartrate (LOPRESSOR) 100 MG tablet Take 1 tablet (100 mg total) by mouth 2 (two) times daily.     Allergies:   Penicillins, Olmesartan, and Amlodipine   Social History   Socioeconomic History  . Marital status: Married    Spouse name: Not on file   . Number of children: Not on file  . Years of education: Not on file  . Highest education level: Not on file  Occupational History  . Not on file  Tobacco Use  . Smoking status: Never Smoker  . Smokeless tobacco: Never Used  Substance and Sexual Activity  . Alcohol use: Yes    Comment: occasionally  . Drug use: No  . Sexual activity: Yes    Partners: Male    Birth control/protection: Surgical  Other Topics Concern  . Not on file  Social History Narrative   Stay at home, married, 9yo son, 18yo, 69yo, exercise - some with walking   Social Determinants of Health   Financial Resource Strain: Not on file  Food Insecurity: Not on file  Transportation Needs: Not on file  Physical Activity: Not on file  Stress: Not on file  Social Connections: Not on file     Family History: The patient's family history includes Diabetes in her brother and mother; Healthy in her father; Heart disease in her maternal aunt; Hypertension in her mother. There is no history of Cancer or Stroke.  ROS:   Please see the history of present illness.    Review of Systems  Constitutional: Positive for malaise/fatigue. Negative for chills and fever.  HENT: Negative for hearing loss and sore throat.   Eyes: Positive for blurred vision. Negative for redness.  Respiratory: Positive for shortness of breath.   Cardiovascular: Positive for chest pain, palpitations, orthopnea and leg swelling. Negative for claudication and PND.  Gastrointestinal: Negative for blood in stool, melena, nausea and vomiting.  Genitourinary: Negative for dysuria and flank pain.  Musculoskeletal: Negative for falls.  Neurological: Negative for dizziness and loss of consciousness.  Endo/Heme/Allergies: Negative for polydipsia.  Psychiatric/Behavioral: Negative for substance abuse.    EKGs/Labs/Other Studies Reviewed:    The following studies were reviewed today: No cardiac studies  EKG:  EKG 03/28/20: NSR with RAE  Recent  Labs: 04/29/2019: ALT 17 03/28/2020: BNP 6.5; BUN 10; Creatinine, Ser 0.67; Hemoglobin 14.4; Platelets 242; Potassium 3.5; Sodium 144  Recent Lipid Panel    Component Value Date/Time   CHOL 180 04/29/2019 1131   TRIG 93 04/29/2019 1131   HDL 62 04/29/2019 1131   CHOLHDL 2.9 04/29/2019 1131   CHOLHDL 3.5 07/18/2014 0001   VLDL 18 07/18/2014 0001   LDLCALC 101 (H) 04/29/2019 1131     Physical Exam:    VS:  BP (!) 134/100   Pulse 89   Ht 5\' 2"  (1.575 m)   Wt 226 lb 3.2 oz (102.6 kg)   LMP 11/28/2012   SpO2 98%   BMI 41.37 kg/m     Wt Readings from Last 3 Encounters:  04/06/20 226 lb 3.2 oz (102.6 kg)  03/28/20 225 lb 6.4 oz (102.2 kg)  03/23/20 228 lb (103.4 kg)     GEN:  Well  nourished, well developed in no acute distress HEENT: Normal NECK: No JVD; No carotid bruits CARDIAC: RRR, no murmurs, rubs, gallops RESPIRATORY:  Clear to auscultation without rales, wheezing or rhonchi  ABDOMEN: Soft, non-tender, non-distended MUSCULOSKELETAL:  No edema; No deformity  SKIN: Warm and dry NEUROLOGIC:  Alert and oriented x 3 PSYCHIATRIC:  Normal affect   ASSESSMENT:    1. Precordial pain   2. Essential hypertension   3. Dyspnea on exertion    PLAN:    In order of problems listed above:  #DOE: #Chest Pressure: Patient with worsening chest pressure and shortness of breath over the past 1.13months. Symptoms worsened with exertion and notably she noticed that she used to be able to walk 3 miles per day and now cannot make it up a flight of stairs without stopping. Work-up with PCP including CBC, BMET, BNP normal. ECG without ischemic changes. Symptoms mildly improved with treatment of HTN but was unable to tolerate amlodipine due to cough. Currently, she complains of continued chest pressure, SOB, orthopnea and occasional palpitaitons. Given symptoms, will pursue ischemic work-up and TTE. -Check coronary CTA -Check TTE -Manage HTN as below  #HTN: Poorly controlled with SBP 170s  at home. May be contributing to symptoms as detailed above -Continue HCTZ 25mg  daily -Start valsartan 80mg  daily -Did not tolerate amlodipine or lisinopril in the past -Refer to HTN clinic -Will keep blood pressure log to review -Counseled about low Na diet    Medication Adjustments/Labs and Tests Ordered: Current medicines are reviewed at length with the patient today.  Concerns regarding medicines are outlined above.  Orders Placed This Encounter  Procedures  . CT CORONARY MORPH W/CTA COR W/SCORE W/CA W/CM &/OR WO/CM  . CT CORONARY FRACTIONAL FLOW RESERVE DATA PREP  . CT CORONARY FRACTIONAL FLOW RESERVE FLUID ANALYSIS  . Basic metabolic panel  . AMB Referral to [facility]  . ECHOCARDIOGRAM COMPLETE   Meds ordered this encounter  Medications  . valsartan (DIOVAN) 80 MG tablet    Sig: Take 1 tablet (80 mg total) by mouth daily.    Dispense:  90 tablet    Refill:  3  . DISCONTD: metoprolol tartrate (LOPRESSOR) 100 MG tablet    Sig: Take 1 tablet (100 mg total) by mouth 2 (two) times daily.    Dispense:  180 tablet    Refill:  3  . metoprolol tartrate (LOPRESSOR) 100 MG tablet    Sig: Take 1 tablet (100 mg total) by mouth once for 1 dose. Take  2 hours prior to coronary ct    Dispense:  1 tablet    Refill:  0    Patient Instructions  Medication Instructions:  Your physician has recommended you make the following change in your medication:  Start Valsartan 80 mg each night by mouth  *If you need a refill on your cardiac medications before your next appointment, please call your pharmacy*   Lab Work: BMET next week  If you have labs (blood work) drawn today and your tests are completely normal, you will receive your results only by: Marland Kitchen MyChart Message (if you have MyChart) OR . A paper copy in the mail If you have any lab test that is abnormal or we need to change your treatment, we will call you to review the results.   Testing/Procedures: Your physician has  requested that you have an echocardiogram. Echocardiography is a painless test that uses sound waves to create images of your heart. It provides your doctor with information  about the size and shape of your heart and how well your heart's chambers and valves are working. This procedure takes approximately one hour. There are no restrictions for this procedure.  Your cardiac CT will be scheduled at one of the below locations:   [facility] [address] [place], [place] [address] [phone]   If scheduled at [facility], please arrive at the [facility] main entrance (entrance A) of [facility] 30 minutes prior to test start time. Proceed to the [facility] Radiology Department (first floor) to check-in and test prep.   Please follow these instructions carefully (unless otherwise directed): Marland Kitchen  On the Night Before the Test: . Be sure to Drink plenty of water. . Do not consume any caffeinated/decaffeinated beverages or chocolate 12 hours prior to your test. . Do not take any antihistamines 12 hours prior to your test.   On the Day of the Test: . Drink plenty of water until 1 hour prior to the test. . Do not eat any food 4 hours prior to the test. . You may take your regular medications prior to the test.  . Take metoprolol (Lopressor) 100 mg two hours prior to test. . HOLD Hydrochlorothiazide morning of the test. . FEMALES- please wear underwire-free bra if available        After the Test: . Drink plenty of water. . After receiving IV contrast, you may experience a mild flushed feeling. This is normal. . On occasion, you may experience a mild rash up to 24 hours after the test. This is not dangerous. If this occurs, you can take Benadryl 25 mg and increase your fluid intake. . If you experience trouble breathing, this can be serious. If it is severe call 911 IMMEDIATELY. If it is mild, please call our office.    Once we have confirmed  authorization from your insurance company, we will call you to set up a date and time for your test. Based on how quickly your insurance processes prior authorizations requests, please allow up to 4 weeks to be contacted for scheduling your Cardiac CT appointment. Be advised that routine Cardiac CT appointments could be scheduled as many as 8 weeks after your provider has ordered it.  For non-scheduling related questions, please contact the cardiac imaging nurse navigator should you have any questions/concerns: Marchia Bond, Cardiac Imaging Nurse Navigator Gordy Clement, Cardiac Imaging Nurse Navigator [facility] Direct Office Dial: [phone]   For scheduling needs, including cancellations and rescheduling, please call [place], [phone].  You have been referred to hypertension clinic here in office Follow-Up: At [facility], you and your health needs are our priority.  As part of our continuing mission to provide you with exceptional heart care, we have created designated Provider Care Teams.  These Care Teams include your primary Cardiologist (physician) and Advanced Practice Providers (APPs -  Physician Assistants and Nurse Practitioners) who all work together to provide you with the care you need, when you need it.    Your next appointment:   3 months  The format for your next appointment:   In Person  Provider:   You may see Dr. Gwyndolyn Kaufman or one of the following Advanced Practice Providers on your designated Care Team:    Richardson Dopp, PA-C  Robbie Lis, [place]    Other Instructions      Signed, Freada Bergeron, MD  04/06/2020 9:26 AM    [place]

## 2020-04-06 ENCOUNTER — Encounter: Payer: Self-pay | Admitting: Cardiology

## 2020-04-06 ENCOUNTER — Other Ambulatory Visit: Payer: Self-pay

## 2020-04-06 ENCOUNTER — Ambulatory Visit (INDEPENDENT_AMBULATORY_CARE_PROVIDER_SITE_OTHER): Payer: Medicaid Other | Admitting: Cardiology

## 2020-04-06 VITALS — BP 134/100 | HR 89 | Ht 62.0 in | Wt 226.2 lb

## 2020-04-06 DIAGNOSIS — R0600 Dyspnea, unspecified: Secondary | ICD-10-CM

## 2020-04-06 DIAGNOSIS — R072 Precordial pain: Secondary | ICD-10-CM | POA: Diagnosis not present

## 2020-04-06 DIAGNOSIS — R0609 Other forms of dyspnea: Secondary | ICD-10-CM

## 2020-04-06 DIAGNOSIS — I10 Essential (primary) hypertension: Secondary | ICD-10-CM | POA: Diagnosis not present

## 2020-04-06 MED ORDER — METOPROLOL TARTRATE 100 MG PO TABS
100.0000 mg | ORAL_TABLET | Freq: Once | ORAL | 0 refills | Status: DC
Start: 2020-04-06 — End: 2020-04-21

## 2020-04-06 MED ORDER — VALSARTAN 80 MG PO TABS: 80.0000 mg | ORAL_TABLET | Freq: Every day | ORAL | 3 refills | Status: DC

## 2020-04-06 MED ORDER — METOPROLOL TARTRATE 100 MG PO TABS: 100.0000 mg | ORAL_TABLET | Freq: Two times a day (BID) | ORAL | 3 refills | Status: DC

## 2020-04-06 NOTE — Patient Instructions (Addendum)
Medication Instructions:  Your physician has recommended you make the following change in your medication:  Start Valsartan 80 mg each night by mouth  *If you need a refill on your cardiac medications before your next appointment, please call your pharmacy*   Lab Work: BMET next week  If you have labs (blood work) drawn today and your tests are completely normal, you will receive your results only by: Sharon Bond MyChart Message (if you have MyChart) OR . A paper copy in the mail If you have any lab test that is abnormal or we need to change your treatment, we will call you to review the results.   Testing/Procedures: Your physician has requested that you have an echocardiogram. Echocardiography is a painless test that uses sound waves to create images of your heart. It provides your doctor with information about the size and shape of your heart and how well your heart's chambers and valves are working. This procedure takes approximately one hour. There are no restrictions for this procedure.  Your cardiac CT will be scheduled at one of the below locations:   [facility] [address] [place], [place] [address] [phone]   If scheduled at [facility], please arrive at the [facility] main entrance (entrance A) of [facility] 30 minutes prior to test start time. Proceed to the [facility] Radiology Department (first floor) to check-in and test prep.   Please follow these instructions carefully (unless otherwise directed): Sharon Bond  On the Night Before the Test: . Be sure to Drink plenty of water. . Do not consume any caffeinated/decaffeinated beverages or chocolate 12 hours prior to your test. . Do not take any antihistamines 12 hours prior to your test.   On the Day of the Test: . Drink plenty of water until 1 hour prior to the test. . Do not eat any food 4 hours prior to the test. . You may take your regular medications prior to the test.  . Take  metoprolol (Lopressor) 100 mg two hours prior to test. . HOLD Hydrochlorothiazide morning of the test. . FEMALES- please wear underwire-free bra if available        After the Test: . Drink plenty of water. . After receiving IV contrast, you may experience a mild flushed feeling. This is normal. . On occasion, you may experience a mild rash up to 24 hours after the test. This is not dangerous. If this occurs, you can take Benadryl 25 mg and increase your fluid intake. . If you experience trouble breathing, this can be serious. If it is severe call 911 IMMEDIATELY. If it is mild, please call our office.    Once we have confirmed authorization from your insurance company, we will call you to set up a date and time for your test. Based on how quickly your insurance processes prior authorizations requests, please allow up to 4 weeks to be contacted for scheduling your Cardiac CT appointment. Be advised that routine Cardiac CT appointments could be scheduled as many as 8 weeks after your provider has ordered it.  For non-scheduling related questions, please contact the cardiac imaging nurse navigator should you have any questions/concerns: Marchia Bond, Cardiac Imaging Nurse Navigator Gordy Clement, Cardiac Imaging Nurse Navigator [place] [facility] Direct Office Dial: [phone]   For scheduling needs, including cancellations and rescheduling, please call [place], [phone].  You have been referred to hypertension clinic here in office Follow-Up: At [facility], you and your health  needs are our priority.  As part of our continuing mission to provide you with exceptional heart care, we have created designated Provider Care Teams.  These Care Teams include your primary Cardiologist (physician) and Advanced Practice Providers (APPs -  Physician Assistants and Nurse Practitioners) who all work together to provide you with the care you need, when you need  it.    Your next appointment:   3 months  The format for your next appointment:   In Person  Provider:   You may see Dr. Gwyndolyn Kaufman or one of the following Advanced Practice Providers on your designated Care Team:    Richardson Dopp, PA-C  Robbie Lis, [place]    Other Instructions

## 2020-04-13 ENCOUNTER — Other Ambulatory Visit: Payer: Self-pay

## 2020-04-13 ENCOUNTER — Other Ambulatory Visit: Payer: Medicaid Other

## 2020-04-13 DIAGNOSIS — R072 Precordial pain: Secondary | ICD-10-CM

## 2020-04-13 LAB — BASIC METABOLIC PANEL
BUN/Creatinine Ratio: 12 (ref 9–23)
BUN: 8 mg/dL (ref 6–24)
CO2: 27 mmol/L (ref 20–29)
Calcium: 9.4 mg/dL (ref 8.7–10.2)
Chloride: 101 mmol/L (ref 96–106)
Creatinine, Ser: 0.66 mg/dL (ref 0.57–1.00)
Glucose: 98 mg/dL (ref 65–99)
Potassium: 4 mmol/L (ref 3.5–5.2)
Sodium: 139 mmol/L (ref 134–144)
eGFR: 105 mL/min/{1.73_m2} (ref >59)

## 2020-04-20 ENCOUNTER — Emergency Department (HOSPITAL_COMMUNITY): Payer: Medicaid Other

## 2020-04-20 ENCOUNTER — Emergency Department (HOSPITAL_COMMUNITY)
Admission: EM | Admit: 2020-04-20 | Discharge: 2020-04-21 | Disposition: A | Discharge status: Home / Self Care | Payer: Medicaid Other | Attending: Emergency Medicine | Admitting: Emergency Medicine

## 2020-04-20 ENCOUNTER — Other Ambulatory Visit: Payer: Self-pay

## 2020-04-20 ENCOUNTER — Encounter (HOSPITAL_COMMUNITY): Payer: Self-pay | Admitting: Emergency Medicine

## 2020-04-20 DIAGNOSIS — R0609 Other forms of dyspnea: Secondary | ICD-10-CM | POA: Diagnosis not present

## 2020-04-20 DIAGNOSIS — R072 Precordial pain: Secondary | ICD-10-CM | POA: Diagnosis present

## 2020-04-20 DIAGNOSIS — Z79899 Other long term (current) drug therapy: Secondary | ICD-10-CM | POA: Insufficient documentation

## 2020-04-20 DIAGNOSIS — R0602 Shortness of breath: Secondary | ICD-10-CM | POA: Insufficient documentation

## 2020-04-20 DIAGNOSIS — I10 Essential (primary) hypertension: Secondary | ICD-10-CM | POA: Insufficient documentation

## 2020-04-20 LAB — BASIC METABOLIC PANEL
Anion gap: 8 (ref 5–15)
BUN: 7 mg/dL (ref 6–20)
CO2: 30 mmol/L (ref 22–32)
Calcium: 9 mg/dL (ref 8.9–10.3)
Chloride: 99 mmol/L (ref 98–111)
Creatinine, Ser: 0.77 mg/dL (ref 0.44–1.00)
GFR, Estimated: >60 mL/min (ref >60)
Glucose, Bld: 129 mg/dL — ABNORMAL HIGH (ref 70–99)
Potassium: 3.2 mmol/L — ABNORMAL LOW (ref 3.5–5.1)
Sodium: 137 mmol/L (ref 135–145)

## 2020-04-20 LAB — CBC
HCT: 44.2 % (ref 36.0–46.0)
Hemoglobin: 14.3 g/dL (ref 12.0–15.0)
MCH: 29.6 pg (ref 26.0–34.0)
MCHC: 32.4 g/dL (ref 30.0–36.0)
MCV: 91.5 fL (ref 80.0–100.0)
Platelets: 242 10*3/uL (ref 150–400)
RBC: 4.83 MIL/uL (ref 3.87–5.11)
RDW: 12.7 % (ref 11.5–15.5)
WBC: 10.3 10*3/uL (ref 4.0–10.5)
nRBC: 0 % (ref 0.0–0.2)

## 2020-04-20 LAB — I-STAT BETA HCG BLOOD, ED (MC, WL, AP ONLY): I-stat hCG, quantitative: <5 m[IU]/mL (ref <5)

## 2020-04-20 LAB — TROPONIN I (HIGH SENSITIVITY)
Troponin I (High Sensitivity): <2 ng/L (ref <18)
Troponin I (High Sensitivity): 3 ng/L (ref <18)

## 2020-04-20 LAB — D-DIMER, QUANTITATIVE: D-Dimer, Quant: 0.37 ug/mL-FEU (ref 0.00–0.50)

## 2020-04-20 MED ORDER — POTASSIUM CHLORIDE CRYS ER 20 MEQ PO TBCR
40.0000 meq | EXTENDED_RELEASE_TABLET | Freq: Once | ORAL | Status: AC
  Administered 2020-04-21: 40 meq via ORAL
  Filled 2020-04-20: qty 2

## 2020-04-20 NOTE — ED Provider Notes (Signed)
[facility] EMERGENCY DEPARTMENT Provider Note   CSN: [id number] Arrival date & time: 04/20/20  2008     History Chief Complaint  Patient presents with  . Chest Pain    Sharon Bond is a 55 y.o. female.  The history is provided by the patient and medical records.  Chest Pain  Sharon Bond is a 55 y.o. female who presents to the Emergency Department complaining of chest pain.  She presents to the ED complaining of chest discomfort. She states that her heart has been beating heart in her chest for the last three days. She has associated shortness of breath with aching in her chest, back and left arm. Symptoms are worse on exertion. She initially starting feeling poorly in April of this year. She saw her PCP was started on amlodipine for hypertension. She had the side effect of cough and this was discontinued and she was referred to cardiology. She was seen on March 17 and started on Valsartan 80 mg as well as magnesium and potassium supplementation's. She has outpatient coronary CT as well as echo scheduled for April 14 of this year. Outpatient labs from several weeks ago with normal BNP.  No diaphoresis, fever.  Has mild cough with activity.  Has occasional abdominal cramping.  Had nausea three days ago - now gone.  Has BLE edema for the last two weeks.    No hx/o DVT/PE.  Has a hx/o HTN.  No family hx/o early CAD.  No tobacco, alcohol, drug.  No hormones.      Past Medical History:  Diagnosis Date  . Eczema   . Hypertension   . IUD (intrauterine device) in place 2005   removed 2015  . Mixed incontinence   . Seasonal allergic rhinitis   . Wears glasses     Patient Active Problem List   Diagnosis Date Noted  . Elevated blood pressure 10/07/2012  . HYPOKALEMIA 03/20/2006  . ANXIETY 03/20/2006  . Essential hypertension 03/20/2006  . RHINITIS, ALLERGIC 03/20/2006  . GASTROESOPHAGEAL REFLUX, NO ESOPHAGITIS 03/20/2006  . FIBROMYALGIA, FIBROMYOSITIS  03/20/2006  . MUSCLE CRAMPS NOS 03/20/2006    Past Surgical History:  Procedure Laterality Date  . DILATATION & CURETTAGE/HYSTEROSCOPY WITH TRUECLEAR N/A 01/08/2013   Procedure: DILATATION & CURETTAGE/HYSTEROSCOPY WITH TRUCLEAR, polypectomy;  Surgeon: Claiborne Billings A. Pamala Hurry, MD;  Location: [facility];  Service: Gynecology;  Laterality: N/A;  . LAPAROSCOPIC TUBAL LIGATION Bilateral 01/08/2013   Procedure: DIAGNOSTIC LAPAROSCOPY Bilateral Salpingectomy;  Surgeon: Claiborne Billings A. Pamala Hurry, MD;  Location: [facility];  Service: Gynecology;  Laterality: Bilateral;  . NO PAST SURGERIES    . WISDOM TOOTH EXTRACTION       OB History    Gravida  4   Para  4   Term  3   Preterm  1   AB      Living  2     SAB      IAB      Ectopic      Multiple      Live Births  2           Family History  Problem Relation Age of Onset  . Diabetes Mother   . Hypertension Mother   . Healthy Father   . Diabetes Brother   . Heart disease Maternal Aunt   . Cancer Neg Hx   . Stroke Neg Hx     Social History   Tobacco Use  . Smoking status: Never Smoker  . Smokeless tobacco:  Never Used  Substance Use Topics  . Alcohol use: Yes    Comment: occasionally  . Drug use: No    Home Medications Prior to Admission medications   Medication Sig Start Date End Date Taking? Authorizing Provider  albuterol (VENTOLIN HFA) 108 (90 Base) MCG/ACT inhaler Inhale 2 puffs into the lungs every 6 (six) hours as needed for wheezing or shortness of breath. 03/23/20   [name], FNP  diclofenac Sodium (VOLTAREN) 1 % GEL Apply 2 g topically 4 (four) times daily. 03/02/20   [name], FNP  EPINEPHrine 0.3 mg/0.3 mL IJ SOAJ injection Inject 0.3 mg into the muscle as needed for anaphylaxis. 03/23/20   [name], FNP  hydrochlorothiazide (HYDRODIURIL) 25 MG tablet Take 1 tablet (25 mg total) by mouth daily. 03/02/20   [name], FNP  Magnesium 250 MG TABS Take 1 tablet (250 mg total) by mouth daily. With evening meal  03/23/20   [name], FNP  metoprolol tartrate (LOPRESSOR) 100 MG tablet Take 1 tablet (100 mg total) by mouth once for 1 dose. Take  2 hours prior to coronary ct 04/06/20 04/06/20  Freada Bergeron, MD  potassium chloride (KLOR-CON) 10 MEQ tablet TAKE 1 TABLET BY MOUTH EVERY DAY 04/03/20   [name], FNP  valsartan (DIOVAN) 80 MG tablet Take 1 tablet (80 mg total) by mouth daily. 04/06/20   Freada Bergeron, MD    Allergies    Penicillins, Olmesartan, and Amlodipine  Review of Systems   Review of Systems  Cardiovascular: Positive for chest pain.  All other systems reviewed and are negative.   Physical Exam Updated Vital Signs BP (!) 143/79 (BP Location: Left Arm)   Pulse 78   Temp 97.9 F (36.6 C) (Oral)   Resp 14   LMP 11/28/2012   SpO2 95%   Physical Exam Vitals and nursing note reviewed.  Constitutional:      Appearance: She is well-developed.  HENT:     Head: Normocephalic and atraumatic.  Cardiovascular:     Rate and Rhythm: Normal rate and regular rhythm.     Heart sounds: No murmur heard.   Pulmonary:     Effort: Pulmonary effort is normal. No respiratory distress.     Breath sounds: Normal breath sounds.  Abdominal:     Palpations: Abdomen is soft.     Tenderness: There is no abdominal tenderness. There is no guarding or rebound.  Musculoskeletal:        General: No tenderness.     Comments: Trace edema  Skin:    General: Skin is warm and dry.  Neurological:     Mental Status: She is alert and oriented to person, place, and time.  Psychiatric:        Behavior: Behavior normal.     ED Results / Procedures / Treatments   Labs (all labs ordered are listed, but only abnormal results are displayed) Labs Reviewed  BASIC METABOLIC PANEL - Abnormal; Notable for the following components:      Result Value   Potassium 3.2 (*)    Glucose, Bld 129 (*)    All other components within normal limits  CBC  D-DIMER, QUANTITATIVE  I-STAT BETA HCG  BLOOD, ED ([facility], [facility], [facility] ONLY)  TROPONIN I (HIGH SENSITIVITY)  TROPONIN I (HIGH SENSITIVITY)    EKG EKG Interpretation  Date/Time:  Thursday April 20 2020 20:17:02 EDT Ventricular Rate:  97 PR Interval:  162 QRS Duration: 80 QT Interval:  350 QTC Calculation: 444 R  Axis:   4 Text Interpretation: Sinus rhythm with occasional Premature ventricular complexes Possible Left atrial enlargement Possible Anterolateral infarct , age undetermined Abnormal ECG Confirmed by Quintella Reichert [phone]) on 04/20/2020 10:45:54 PM   Radiology DG Chest 2 View  Result Date: 04/20/2020 CLINICAL DATA:  Chest pain. EXAM: CHEST - 2 VIEW COMPARISON:  September 06, 2013 FINDINGS: Cardiomediastinal silhouette is normal. Mediastinal contours appear intact. There is no evidence of focal airspace consolidation, pleural effusion or pneumothorax. Low lung volumes. Osseous structures are without acute abnormality. Soft tissues are grossly normal. IMPRESSION: No active cardiopulmonary disease. Electronically Signed   By: Fidela Salisbury M.D.   On: 04/20/2020 20:46    Procedures Procedures   Medications Ordered in ED Medications  potassium chloride SA (KLOR-CON) CR tablet 40 mEq (40 mEq Oral Given 04/21/20 0000)    ED Course  I have reviewed the triage vital signs and the nursing notes.  Pertinent labs & imaging results that were available during my care of the patient were reviewed by me and considered in my medical decision making (see chart for details).    MDM Rules/Calculators/A&P                         patient here for evaluation of progressive chest discomfort, dyspnea on exertion. EKG is without acute ischemic changes in troponin is negative times two. D dimer was obtained and this is negative. Presentation is not consistent with PE. Discussed with patient unclear source of symptoms. Feels she is stable for outpatient PCP and cardiology follow-up. Would not change medications at this time.  Final Clinical  Impression(s) / ED Diagnoses Final diagnoses:  Precordial pain    Rx / DC Orders ED Discharge Orders    None       Quintella Reichert, MD 04/21/20 [phone]

## 2020-04-20 NOTE — ED Triage Notes (Signed)
Pt c/o shortness of breath x 2 days and central chest pain that started last night, also states she feels swollen. Reports recent medication changes.

## 2020-04-21 ENCOUNTER — Encounter: Payer: Self-pay | Admitting: Pharmacist

## 2020-04-21 ENCOUNTER — Ambulatory Visit: Payer: Medicaid Other | Admitting: Pharmacist

## 2020-04-21 VITALS — BP 142/92 | HR 82

## 2020-04-21 DIAGNOSIS — I10 Essential (primary) hypertension: Secondary | ICD-10-CM

## 2020-04-21 DIAGNOSIS — R072 Precordial pain: Secondary | ICD-10-CM

## 2020-04-21 MED ORDER — NEBIVOLOL HCL 5 MG PO TABS: 5.0000 mg | ORAL_TABLET | Freq: Every day | ORAL | 2 refills | Status: DC

## 2020-04-21 NOTE — Progress Notes (Signed)
Patient ID: Sharon Bond                 DOB: 08-Nov-1965                      MRN: [id number]     HPI: Sharon Bond is a 55 y.o. female referred by Dr. Johney Frame to HTN clinic. PMH is significant for HTN, pre DM, GERD, anxiety, and hypokalemia.  Patient was seen by PCP on 3/3 for chest pain and hypertension and was started on amlodipine.  She subsequently developed a cough and it was discontinued.  Referred to cardiology and was started on valsartan.   Of note, patient was seen in ED yesterday for chest pain and BP was 143/79. Described the pain as a pressure in her chest which strethced to her neck and down her left arm.  Is currently not working due to an injury to her right arm in May 2021 which still hurts but this is the first time she felt pain in left arm.  Checked her BP at home at systolic was >220.  Patient uses a wrist cuff at home but she does not know if it is accurate.  Is tolerating HCTZ and valsartan although she says she is very sleepy since taking valsartan.  Takes at night.  Checks BP in morning when she wakes up.  Does not add salt to her food.  Lives with her son and husband and used to work for [employer].    Patient thinks that some of her issues may be due to anxiety due to be out of work and over health and worry about developing dementia. Mother currently has dementia and resides in assisted living.  Has upcoming Echo in 2 weeks and appointment with PCP in 3 weeks.  Lab work updated last night at ED which showed hypokalemia.  Patient currently on potassium supplementation.  Current HTN meds: HCTZ 25mg  daily, valsartan 80mg  daily Previously tried: amlodipine (cough), lisinopril BP goal: <130/80  Family History: Aunt (CHF)  Wt Readings from Last 3 Encounters:  04/06/20 226 lb 3.2 oz (102.6 kg)  03/28/20 225 lb 6.4 oz (102.2 kg)  03/23/20 228 lb (103.4 kg)   BP Readings from Last 3 Encounters:  04/21/20 (!) 143/79  04/06/20 (!) 134/100  03/28/20 (!) 148/90   Pulse  Readings from Last 3 Encounters:  04/21/20 78  04/06/20 89  03/28/20 91    Renal function: CrCl cannot be calculated (Unknown ideal weight.).  Past Medical History:  Diagnosis Date  . Eczema   . Hypertension   . IUD (intrauterine device) in place 2005   removed 2015  . Mixed incontinence   . Seasonal allergic rhinitis   . Wears glasses     Current Outpatient Medications on File Prior to Visit  Medication Sig Dispense Refill  . albuterol (VENTOLIN HFA) 108 (90 Base) MCG/ACT inhaler Inhale 2 puffs into the lungs every 6 (six) hours as needed for wheezing or shortness of breath. 8 g 2  . diclofenac Sodium (VOLTAREN) 1 % GEL Apply 2 g topically 4 (four) times daily. 100 g 2  . EPINEPHrine 0.3 mg/0.3 mL IJ SOAJ injection Inject 0.3 mg into the muscle as needed for anaphylaxis. 1 each 1  . hydrochlorothiazide (HYDRODIURIL) 25 MG tablet Take 1 tablet (25 mg total) by mouth daily. 90 tablet 0  . Magnesium 250 MG TABS Take 1 tablet (250 mg total) by mouth daily. With evening meal 30 tablet 2  .  metoprolol tartrate (LOPRESSOR) 100 MG tablet Take 1 tablet (100 mg total) by mouth once for 1 dose. Take  2 hours prior to coronary ct 1 tablet 0  . potassium chloride (KLOR-CON) 10 MEQ tablet TAKE 1 TABLET BY MOUTH EVERY DAY 30 tablet 1  . valsartan (DIOVAN) 80 MG tablet Take 1 tablet (80 mg total) by mouth daily. 90 tablet 3   No current facility-administered medications on file prior to visit.    Allergies  Allergen Reactions  . Penicillins Swelling and Other (See Comments)    Facial swelling  . Olmesartan     "cough"  . Amlodipine Cough     Assessment/Plan:  1. Hypertension - Patient BP in room today 142/92 which is above goal of <130/80.  SBP similar to reading in ED last night but diastolic much higher.    Patient reports anxiety which may be contributing to BP increases.  Recommended she briong up symptoms to PCP at next visit in 2 weeks as this may be an underlying issue.  She  has underyling anxiety regarding her health, which then in turn increases her BP.    Recommended she purchase an [employer] 3 or Bronze BP cuff rather than use her wrist cuff for greater accuracy.  Recommended she take BP ~2hours after morning medications rather than when she wakes up.  Patient would benefit from additional medication.  Gave patient options of changing HCTZ to chlorthalidone, increasing valsartan, or adding additional agent.  Due to hypokalemia, will not change to chlorthalidone at this time and due to patient feeling tired on valsartan, will hold off on increasing dose.  Will start low dose nebivolol once daily and recheck in 1 month.  Patient voiced understanding.  Continue HCTZ 25mg  daily Continue valsartan 80mg  daily Start nebivolol 5mg  once daily Recheck in 4 weeks  Karren Cobble, PharmD, BCACP, [place], [place] [address] [place], [place] [address] Phone: [phone]; Fax: [phone] 04/21/2020 11:15 AM

## 2020-04-21 NOTE — Patient Instructions (Addendum)
It was nice meeting you today!  We would like your blood pressure to be less than 130/80  Please continue to monitor your blood pressure at home.  The blood pressure cuffs we recommend are made by Omron.  We recommend the Omron Bronze or 3 Series.  Try to take your blood pressure about 2 hours after you take your medications.  Continue your hydrochlorothiazide 25mg  once daily and your valsartan 80mg  once daily  I am going to start a new medication called Nebivolol which you will take once a day.    Please continue to watch your salt intake and call with any questions or concerns  Karren Cobble, PharmD, BCACP, CDCES, [place] [address], [place], [place] [address] Phone: [phone]; Fax: [phone] 04/21/2020 10:28 AM

## 2020-04-27 ENCOUNTER — Other Ambulatory Visit: Payer: Self-pay | Admitting: Nurse Practitioner

## 2020-04-27 DIAGNOSIS — E876 Hypokalemia: Secondary | ICD-10-CM

## 2020-05-03 ENCOUNTER — Telehealth: Payer: Self-pay | Admitting: Pharmacist

## 2020-05-03 DIAGNOSIS — R072 Precordial pain: Secondary | ICD-10-CM

## 2020-05-03 DIAGNOSIS — I10 Essential (primary) hypertension: Secondary | ICD-10-CM

## 2020-05-03 MED ORDER — NEBIVOLOL HCL 5 MG PO TABS: 5.0000 mg | ORAL_TABLET | Freq: Every day | ORAL | 2 refills | Status: DC

## 2020-05-03 NOTE — Telephone Encounter (Signed)
Patient called, reported her pharmacy instructed her to call offce for bystolic refill.  Pt has not been checking her blood pressure recently because the results give her anxiety. Has echo scheduled tomorrow and PCP appt on Monday where she will have BP checked again

## 2020-05-04 ENCOUNTER — Other Ambulatory Visit: Payer: Self-pay

## 2020-05-04 ENCOUNTER — Ambulatory Visit (HOSPITAL_COMMUNITY): Payer: Medicaid Other | Attending: Cardiology

## 2020-05-04 DIAGNOSIS — R072 Precordial pain: Secondary | ICD-10-CM | POA: Diagnosis present

## 2020-05-04 LAB — ECHOCARDIOGRAM COMPLETE
Area-P 1/2: 4.39 cm2
S' Lateral: 2.7 cm

## 2020-05-04 MED ORDER — PERFLUTREN LIPID MICROSPHERE
1.0000 mL | INTRAVENOUS | Status: AC | PRN
  Administered 2020-05-04: 2 mL via INTRAVENOUS

## 2020-05-05 ENCOUNTER — Other Ambulatory Visit: Payer: Self-pay | Admitting: Pharmacist

## 2020-05-05 DIAGNOSIS — I10 Essential (primary) hypertension: Secondary | ICD-10-CM

## 2020-05-05 DIAGNOSIS — R072 Precordial pain: Secondary | ICD-10-CM

## 2020-05-05 MED ORDER — NEBIVOLOL HCL 5 MG PO TABS: 5.0000 mg | ORAL_TABLET | Freq: Every day | ORAL | 3 refills | Status: DC

## 2020-05-05 NOTE — Progress Notes (Signed)
Pt called stating [facility] doesn't have Bystolic refill record on file. Our office sent in a refill on 4/13, receipt confirmed by pharmacy so I am unsure why they do not have it. Will resend. Pt appreciative for assistance.

## 2020-05-08 ENCOUNTER — Encounter: Payer: Self-pay | Admitting: Nurse Practitioner

## 2020-05-08 ENCOUNTER — Ambulatory Visit (INDEPENDENT_AMBULATORY_CARE_PROVIDER_SITE_OTHER): Payer: Medicaid Other | Admitting: Nurse Practitioner

## 2020-05-08 ENCOUNTER — Other Ambulatory Visit: Payer: Self-pay

## 2020-05-08 VITALS — BP 134/80 | HR 76 | Temp 98.5°F | Ht 62.6 in | Wt 228.8 lb

## 2020-05-08 DIAGNOSIS — Z0000 Encounter for general adult medical examination without abnormal findings: Secondary | ICD-10-CM | POA: Diagnosis not present

## 2020-05-08 DIAGNOSIS — R7309 Other abnormal glucose: Secondary | ICD-10-CM | POA: Diagnosis not present

## 2020-05-08 DIAGNOSIS — I10 Essential (primary) hypertension: Secondary | ICD-10-CM

## 2020-05-08 DIAGNOSIS — R5383 Other fatigue: Secondary | ICD-10-CM

## 2020-05-08 DIAGNOSIS — R351 Nocturia: Secondary | ICD-10-CM

## 2020-05-08 DIAGNOSIS — Z1231 Encounter for screening mammogram for malignant neoplasm of breast: Secondary | ICD-10-CM

## 2020-05-08 DIAGNOSIS — Z6841 Body Mass Index (BMI) 40.0 and over, adult: Secondary | ICD-10-CM

## 2020-05-08 DIAGNOSIS — Z114 Encounter for screening for human immunodeficiency virus [HIV]: Secondary | ICD-10-CM

## 2020-05-08 DIAGNOSIS — F419 Anxiety disorder, unspecified: Secondary | ICD-10-CM | POA: Diagnosis not present

## 2020-05-08 DIAGNOSIS — Z1211 Encounter for screening for malignant neoplasm of colon: Secondary | ICD-10-CM

## 2020-05-08 LAB — POCT URINALYSIS DIPSTICK
Bilirubin, UA: NEGATIVE
Blood, UA: NEGATIVE
Glucose, UA: NEGATIVE
Ketones, UA: NEGATIVE
Leukocytes, UA: NEGATIVE
Nitrite, UA: NEGATIVE
Protein, UA: NEGATIVE
Spec Grav, UA: 1.015 (ref 1.010–1.025)
Urobilinogen, UA: 0.2 E.U./dL
pH, UA: 7 (ref 5.0–8.0)

## 2020-05-08 LAB — POCT UA - MICROALBUMIN
Albumin/Creatinine Ratio, Urine, POC: 30
Creatinine, POC: 10 mg/dL
Microalbumin Ur, POC: 10 mg/L

## 2020-05-08 MED ORDER — BUSPIRONE HCL 10 MG PO TABS: 10.0000 mg | ORAL_TABLET | Freq: Every day | ORAL | 2 refills | Status: DC

## 2020-05-08 NOTE — Patient Instructions (Signed)
Health Maintenance, Female Adopting a healthy lifestyle and getting preventive care are important in promoting health and wellness. Ask your health care provider about:  The right schedule for you to have regular tests and exams.  Things you can do on your own to prevent diseases and keep yourself healthy. What should I know about diet, weight, and exercise? Eat a healthy diet  Eat a diet that includes plenty of vegetables, fruits, low-fat dairy products, and lean protein.  Do not eat a lot of foods that are high in solid fats, added sugars, or sodium.   Maintain a healthy weight Body mass index (BMI) is used to identify weight problems. It estimates body fat based on height and weight. Your health care provider can help determine your BMI and help you achieve or maintain a healthy weight. Get regular exercise Get regular exercise. This is one of the most important things you can do for your health. Most adults should:  Exercise for at least 150 minutes each week. The exercise should increase your heart rate and make you sweat (moderate-intensity exercise).  Do strengthening exercises at least twice a week. This is in addition to the moderate-intensity exercise.  Spend less time sitting. Even light physical activity can be beneficial. Watch cholesterol and blood lipids Have your blood tested for lipids and cholesterol at 54 years of age, then have this test every 5 years. Have your cholesterol levels checked more often if:  Your lipid or cholesterol levels are high.  You are older than 54 years of age.  You are at high risk for heart disease. What should I know about cancer screening? Depending on your health history and family history, you may need to have cancer screening at various ages. This may include screening for:  Breast cancer.  Cervical cancer.  Colorectal cancer.  Skin cancer.  Lung cancer. What should I know about heart disease, diabetes, and high blood  pressure? Blood pressure and heart disease  High blood pressure causes heart disease and increases the risk of stroke. This is more likely to develop in people who have high blood pressure readings, are of African descent, or are overweight.  Have your blood pressure checked: ? Every 3-5 years if you are 18-39 years of age. ? Every year if you are 40 years old or older. Diabetes Have regular diabetes screenings. This checks your fasting blood sugar level. Have the screening done:  Once every three years after age 40 if you are at a normal weight and have a low risk for diabetes.  More often and at a younger age if you are overweight or have a high risk for diabetes. What should I know about preventing infection? Hepatitis B If you have a higher risk for hepatitis B, you should be screened for this virus. Talk with your health care provider to find out if you are at risk for hepatitis B infection. Hepatitis C Testing is recommended for:  Everyone born from 1945 through 1965.  Anyone with known risk factors for hepatitis C. Sexually transmitted infections (STIs)  Get screened for STIs, including gonorrhea and chlamydia, if: ? You are sexually active and are younger than 54 years of age. ? You are older than 54 years of age and your health care provider tells you that you are at risk for this type of infection. ? Your sexual activity has changed since you were last screened, and you are at increased risk for chlamydia or gonorrhea. Ask your health care provider   if you are at risk.  Ask your health care provider about whether you are at high risk for HIV. Your health care provider may recommend a prescription medicine to help prevent HIV infection. If you choose to take medicine to prevent HIV, you should first get tested for HIV. You should then be tested every 3 months for as long as you are taking the medicine. Pregnancy  If you are about to stop having your period (premenopausal) and  you may become pregnant, seek counseling before you get pregnant.  Take 400 to 800 micrograms (mcg) of folic acid every day if you become pregnant.  Ask for birth control (contraception) if you want to prevent pregnancy. Osteoporosis and menopause Osteoporosis is a disease in which the bones lose minerals and strength with aging. This can result in bone fractures. If you are 65 years old or older, or if you are at risk for osteoporosis and fractures, ask your health care provider if you should:  Be screened for bone loss.  Take a calcium or vitamin D supplement to lower your risk of fractures.  Be given hormone replacement therapy (HRT) to treat symptoms of menopause. Follow these instructions at home: Lifestyle  Do not use any products that contain nicotine or tobacco, such as cigarettes, e-cigarettes, and chewing tobacco. If you need help quitting, ask your health care provider.  Do not use street drugs.  Do not share needles.  Ask your health care provider for help if you need support or information about quitting drugs. Alcohol use  Do not drink alcohol if: ? Your health care provider tells you not to drink. ? You are pregnant, may be pregnant, or are planning to become pregnant.  If you drink alcohol: ? Limit how much you use to 0-1 drink a day. ? Limit intake if you are breastfeeding.  Be aware of how much alcohol is in your drink. In the [place] one drink equals one 12 oz bottle of beer (355 mL), one 5 oz glass of wine (148 mL), or one 1 oz glass of hard liquor (44 mL). General instructions  Schedule regular health, dental, and eye exams.  Stay current with your vaccines.  Tell your health care provider if: ? You often feel depressed. ? You have ever been abused or do not feel safe at home. Summary  Adopting a healthy lifestyle and getting preventive care are important in promoting health and wellness.  Follow your health care provider's instructions about healthy  diet, exercising, and getting tested or screened for diseases.  Follow your health care provider's instructions on monitoring your cholesterol and blood pressure. This information is not intended to replace advice given to you by your health care provider. Make sure you discuss any questions you have with your health care provider. Document Revised: 12/31/2017 Document Reviewed: 12/31/2017 Elsevier Patient Education  2021 [employer]  

## 2020-05-08 NOTE — Progress Notes (Signed)
I,Yamilka Roman [employer] as a Education administrator for [employer], FNP.,have documented all relevant documentation on the behalf of [name], FNP,as directed by  [name], FNP while in the presence of [name], [place].  This visit occurred during the SARS-CoV-2 public health emergency.  Safety protocols were in place, including screening questions prior to the visit, additional usage of staff PPE, and extensive cleaning of exam room while observing appropriate contact time as indicated for disinfecting solutions.  Subjective:     Patient ID: Sharon Bond , female    DOB: [birth year] , 55 y.o.   MRN: [id number]   Chief Complaint  Patient presents with  . Annual Exam    HPI8  Here for hm. She is on [employer] Readings from Last 3 Encounters: 05/08/20 : 228 lb 12.8 oz (103.8 kg) 04/06/20 : 226 lb 3.2 oz (102.6 kg) 03/28/20 : 225 lb 6.4 oz (102.2 kg)    Past Medical History:  Diagnosis Date  . Eczema   . Hypertension   . IUD (intrauterine device) in place 2005   removed 2015  . Mixed incontinence   . Seasonal allergic rhinitis   . Wears glasses      Family History  Problem Relation Age of Onset  . Diabetes Mother   . Hypertension Mother   . Healthy Father   . Diabetes Brother   . Heart disease Maternal Aunt   . Cancer Neg Hx   . Stroke Neg Hx      Current Outpatient Medications:  .  albuterol (VENTOLIN HFA) 108 (90 Base) MCG/ACT inhaler, Inhale 2 puffs into the lungs every 6 (six) hours as needed for wheezing or shortness of breath., Disp: 8 g, Rfl: 2 .  busPIRone (BUSPAR) 10 MG tablet, Take 1 tablet (10 mg total) by mouth daily., Disp: 30 tablet, Rfl: 2 .  EPINEPHrine 0.3 mg/0.3 mL IJ SOAJ injection, Inject 0.3 mg into the muscle as needed for anaphylaxis., Disp: 1 each, Rfl: 1 .  hydrochlorothiazide (HYDRODIURIL) 25 MG tablet, Take 1 tablet (25 mg total) by mouth daily., Disp: 90 tablet, Rfl: 0 .  Magnesium 250 MG TABS, Take 1 tablet (250 mg total) by mouth  daily. With evening meal, Disp: 30 tablet, Rfl: 2 .  nebivolol (BYSTOLIC) 5 MG tablet, Take 1 tablet (5 mg total) by mouth daily., Disp: 30 tablet, Rfl: 3 .  potassium chloride (KLOR-CON) 10 MEQ tablet, TAKE 1 TABLET BY MOUTH EVERY DAY, Disp: 30 tablet, Rfl: 1 .  valsartan (DIOVAN) 80 MG tablet, Take 1 tablet (80 mg total) by mouth daily., Disp: 90 tablet, Rfl: 3   Allergies  Allergen Reactions  . Penicillins Swelling and Other (See Comments)    Facial swelling  . Olmesartan     "cough"  . Amlodipine Cough      The patient states she uses status post hysterectomy for birth control.  Patient's last menstrual period was 11/28/2012.. Negative for Dysmenorrhea and Negative for Menorrhagia. Negative for: breast discharge, breast lump(s), breast pain and breast self exam. Associated symptoms include abnormal vaginal bleeding. Pertinent negatives include abnormal bleeding (hematology), anxiety, decreased libido, depression, difficulty falling sleep, dyspareunia, history of infertility, nocturia, sexual dysfunction, sleep disturbances, urinary incontinence, urinary urgency, vaginal discharge and vaginal itching. Diet regular; she is doing better but is cutting back on her meat. She has increased her fruits and vegetables. The patient states her exercise level is minimal   The patient's tobacco use is:  Social History   Tobacco  Use  Smoking Status Never Smoker  Smokeless Tobacco Never Used  . She has been exposed to passive smoke. The patient's alcohol use is:  Social History   Substance and Sexual Activity  Alcohol Use Yes   Comment: occasionally    Review of Systems  Constitutional: Negative.   HENT: Negative.   Eyes: Negative.   Respiratory: Negative.   Cardiovascular: Negative.  Negative for chest pain, palpitations and leg swelling.  Gastrointestinal: Negative.   Endocrine: Negative.   Genitourinary: Negative.   Musculoskeletal: Negative.   Skin: Negative.    Allergic/Immunologic: Negative.   Neurological: Negative.   Hematological: Negative.   Psychiatric/Behavioral: Negative.      Today's Vitals   05/08/20 1413  BP: 134/80  Pulse: 76  Temp: 98.5 F (36.9 C)  TempSrc: Oral  Weight: 228 lb 12.8 oz (103.8 kg)  Height: 5' 2.6" (1.59 m)  PainSc: 0-No pain   Body mass index is 41.05 kg/m.   Objective:  Physical Exam Vitals reviewed.  Constitutional:      General: She is not in acute distress.    Appearance: Normal appearance. She is well-developed. She is obese.  HENT:     Head: Normocephalic and atraumatic.     Right Ear: Hearing, tympanic membrane, ear canal and external ear normal. There is no impacted cerumen.     Left Ear: Hearing, tympanic membrane, ear canal and external ear normal. There is no impacted cerumen.     Nose:     Comments: Deferred - masked    Mouth/Throat:     Comments: Deferred - masked Eyes:     General: Lids are normal.     Extraocular Movements: Extraocular movements intact.     Conjunctiva/sclera: Conjunctivae normal.     Pupils: Pupils are equal, round, and reactive to light.     Funduscopic exam:    Right eye: No papilledema.        Left eye: No papilledema.  Neck:     Thyroid: No thyroid mass.     Vascular: No carotid bruit.  Cardiovascular:     Rate and Rhythm: Normal rate and regular rhythm.     Pulses: Normal pulses.     Heart sounds: Normal heart sounds. No murmur heard.   Pulmonary:     Effort: Pulmonary effort is normal.     Breath sounds: Normal breath sounds.  Chest:     Chest wall: No mass.  Breasts:     Tanner Score is 5.     Right: Normal. No mass, tenderness, axillary adenopathy or supraclavicular adenopathy.     Left: Normal. No mass, tenderness, axillary adenopathy or supraclavicular adenopathy.    Abdominal:     General: Abdomen is flat. Bowel sounds are normal. There is no distension.     Palpations: Abdomen is soft.     Tenderness: There is no abdominal  tenderness.  Genitourinary:    Rectum: Guaiac result negative.  Musculoskeletal:        General: No swelling. Normal range of motion.     Cervical back: Full passive range of motion without pain, normal range of motion and neck supple.     Right lower leg: No edema.     Left lower leg: No edema.  Lymphadenopathy:     Upper Body:     Right upper body: No supraclavicular, axillary or pectoral adenopathy.     Left upper body: No supraclavicular, axillary or pectoral adenopathy.  Skin:    General: Skin is warm  and dry.     Capillary Refill: Capillary refill takes less than 2 seconds.  Neurological:     General: No focal deficit present.     Mental Status: She is alert and oriented to person, place, and time.     Cranial Nerves: No cranial nerve deficit.     Sensory: No sensory deficit.     Motor: No weakness.  Psychiatric:        Mood and Affect: Mood normal.        Behavior: Behavior normal.        Thought Content: Thought content normal.        Judgment: Judgment normal.         Assessment And Plan:     1. Encounter for general adult medical examination w/o abnormal findings . Behavior modifications discussed and diet history reviewed.   . Pt will continue to exercise regularly and modify diet with low GI, plant based foods and decrease intake of processed foods.  . Recommend intake of daily multivitamin, Vitamin D, and calcium.   Recommend mammogram and colonoscopy for preventive screenings, as well as recommend immunizations that include influenza, TDAP, and Shingles (will encourage to get immunization). She has not received her covid booster at this time  2. Essential hypertension . B/P is controlled.  . CMP ordered to check renal function.  . The importance of regular exercise and dietary modification was stressed to the patient.  . Stressed importance of losing ten percent of her body weight to help with B/P control.  . EKG done in ED on 04/20/2020 - POCT Urinalysis  Dipstick (81002) - CMP14+EGFR - CBC - Lipid panel  3. Abnormal glucose  Chronic, controlled  No current medications  Encouraged to limit intake of sugary foods and drinks  Encouraged to increase physical activity to 150 minutes per week - POCT UA - Microalbumin - Hemoglobin A1c  4. Class 3 severe obesity due to excess calories with serious comorbidity and body mass index (BMI) of 40.0 to 44.9 in adult [facility])  Chronic  Discussed healthy diet and regular exercise options   Encouraged to exercise at least 150 minutes per week with 2 days of strength training  5. Encounter for HIV (human immunodeficiency virus) test - HIV Antibody (routine testing w rflx)  6. Encounter for screening colonoscopy  According to USPTF Colorectal cancer Screening guidelines. Colonoscopy is recommended every 10 years, starting at age 25 years.  Will refer to GI for colon cancer screening. - Ambulatory referral to Gastroenterology  7. Encounter for screening mammogram for malignant neoplasm of breast  Pt instructed on Self Breast Exam.According to ACOG guidelines Women aged 48 and older are recommended to get an annual mammogram. Form completed and given to patient contact the [facility] for appointment scheduing.   Pt encouraged to get annual mammogram - MM Digital Screening; Future  8. Anxiety  Chronic, this is starting to cause challenges with daily life style  Will try her on buspar daily - busPIRone (BUSPAR) 10 MG tablet; Take 1 tablet (10 mg total) by mouth daily.  Dispense: 30 tablet; Refill: 2  9. Fatigue, unspecified type  Will check for metabolic causes   Will also check for sleep study, has high BMI which is a risk factor for sleep apnea - TSH - Vitamin B12 - Ambulatory referral to Sleep Studies  10. Nocturia  Will send for sleep study for further evaluation of possible sleep apnea    Patient was given opportunity to  ask questions. Patient verbalized understanding  of the plan and was able to repeat key elements of the plan. All questions were answered to their satisfaction.   [name], FNP   I, [name], FNP, have reviewed all documentation for this visit. The documentation on 05/08/20 for the exam, diagnosis, procedures, and orders are all accurate and complete.   THE PATIENT IS ENCOURAGED TO PRACTICE SOCIAL DISTANCING DUE TO THE COVID-19 PANDEMIC.

## 2020-05-09 ENCOUNTER — Telehealth: Payer: Self-pay

## 2020-05-09 ENCOUNTER — Telehealth (HOSPITAL_COMMUNITY): Payer: Self-pay | Admitting: *Deleted

## 2020-05-09 NOTE — Telephone Encounter (Signed)
**Note De-Identified Sharon Bond Obfuscation** I started a Nebivolol PA through covermymeds and received the following message:  Kadajah Kjos [place]: [facility] - PA Case ID: [id number] Outcome: Approved today PA Case: [id number] Coverage Starts on: 05/09/2020 12:00:00 AM, Coverage Ends on: 05/09/2021 12:00:00 AM. Drug: Nebivolol HCl 5MG  tablets Form: [employer] [facility] Electronic [place] Form [id number] NCPDP)  I have notified [facility] of this approval.

## 2020-05-09 NOTE — Telephone Encounter (Signed)
Reaching out to patient to offer assistance regarding upcoming cardiac imaging study; pt verbalizes understanding of appt date/time, parking situation and where to check in, pre-test NPO status and medications ordered, and verified current allergies; name and call back number provided for further questions should they arise  Chantrell Apsey RN Navigator Cardiac Imaging [facility] [phone] office [phone] cell  

## 2020-05-10 LAB — CMP14+EGFR
ALT: 13 IU/L (ref 0–32)
AST: 14 IU/L (ref 0–40)
Albumin/Globulin Ratio: 1.3 (ref 1.2–2.2)
Albumin: 4.2 g/dL (ref 3.8–4.9)
Alkaline Phosphatase: 116 IU/L (ref 44–121)
BUN/Creatinine Ratio: 9 (ref 9–23)
BUN: 7 mg/dL (ref 6–24)
Bilirubin Total: 0.4 mg/dL (ref 0.0–1.2)
CO2: 24 mmol/L (ref 20–29)
Calcium: 9.5 mg/dL (ref 8.7–10.2)
Chloride: 97 mmol/L (ref 96–106)
Creatinine, Ser: 0.78 mg/dL (ref 0.57–1.00)
Globulin, Total: 3.2 g/dL (ref 1.5–4.5)
Glucose: 92 mg/dL (ref 65–99)
Potassium: 4.2 mmol/L (ref 3.5–5.2)
Sodium: 140 mmol/L (ref 134–144)
Total Protein: 7.4 g/dL (ref 6.0–8.5)
eGFR: 91 mL/min/{1.73_m2} (ref >59)

## 2020-05-10 LAB — CBC
Hematocrit: 45.9 % (ref 34.0–46.6)
Hemoglobin: 15.2 g/dL (ref 11.1–15.9)
MCH: 29.2 pg (ref 26.6–33.0)
MCHC: 33.1 g/dL (ref 31.5–35.7)
MCV: 88 fL (ref 79–97)
Platelets: 237 10*3/uL (ref 150–450)
RBC: 5.21 x10E6/uL (ref 3.77–5.28)
RDW: 12.6 % (ref 11.7–15.4)
WBC: 7.7 10*3/uL (ref 3.4–10.8)

## 2020-05-10 LAB — HEMOGLOBIN A1C
Est. average glucose Bld gHb Est-mCnc: 128 mg/dL
Hgb A1c MFr Bld: 6.1 % — ABNORMAL HIGH (ref 4.8–5.6)

## 2020-05-10 LAB — TSH: TSH: 1.47 u[IU]/mL (ref 0.450–4.500)

## 2020-05-10 LAB — VITAMIN B12: Vitamin B-12: 231 pg/mL — ABNORMAL LOW (ref 232–1245)

## 2020-05-10 LAB — HIV ANTIBODY (ROUTINE TESTING W REFLEX): HIV Screen 4th Generation wRfx: NONREACTIVE

## 2020-05-11 ENCOUNTER — Other Ambulatory Visit: Payer: Self-pay

## 2020-05-11 ENCOUNTER — Encounter (HOSPITAL_COMMUNITY): Payer: Self-pay

## 2020-05-11 ENCOUNTER — Ambulatory Visit (HOSPITAL_COMMUNITY)
Admission: RE | Admit: 2020-05-11 | Discharge: 2020-05-11 | Disposition: A | Discharge status: Home / Self Care | Payer: Medicaid Other | Source: Ambulatory Visit | Attending: Cardiology | Admitting: Cardiology

## 2020-05-11 DIAGNOSIS — R072 Precordial pain: Secondary | ICD-10-CM

## 2020-05-11 DIAGNOSIS — Z006 Encounter for examination for normal comparison and control in clinical research program: Secondary | ICD-10-CM

## 2020-05-11 LAB — LIPID PANEL
Chol/HDL Ratio: 3.4 ratio (ref 0.0–4.4)
Cholesterol, Total: 209 mg/dL — ABNORMAL HIGH (ref 100–199)
HDL: 61 mg/dL (ref >39)
LDL Chol Calc (NIH): 133 mg/dL — ABNORMAL HIGH (ref 0–99)
Triglycerides: 84 mg/dL (ref 0–149)
VLDL Cholesterol Cal: 15 mg/dL (ref 5–40)

## 2020-05-11 LAB — SPECIMEN STATUS REPORT

## 2020-05-11 MED ORDER — NITROGLYCERIN 0.4 MG SL SUBL
SUBLINGUAL_TABLET | SUBLINGUAL | Status: AC
  Filled 2020-05-11: qty 2

## 2020-05-11 MED ORDER — IOHEXOL 350 MG/ML SOLN
80.0000 mL | Freq: Once | INTRAVENOUS | Status: AC | PRN
  Administered 2020-05-11: 80 mL via INTRAVENOUS

## 2020-05-11 MED ORDER — NITROGLYCERIN 0.4 MG SL SUBL
0.8000 mg | SUBLINGUAL_TABLET | Freq: Once | SUBLINGUAL | Status: AC
  Administered 2020-05-11: 0.8 mg via SUBLINGUAL

## 2020-05-11 NOTE — Research (Signed)
IDENTIFY Informed Consent                  Subject Name: Sharon Bond. [place]   Subject met inclusion and exclusion criteria.  The informed consent form, study requirements and expectations were reviewed with the subject and questions and concerns were addressed prior to the signing of the consent form.  The subject verbalized understanding of the trial requirements.  The subject agreed to participate in the IDENTIFY trial and signed the informed consent at 09:12AM on 05/11/20.  The informed consent was obtained prior to performance of any protocol-specific procedures for the subject.  A copy of the signed informed consent was given to the subject and a copy was placed in the subject's medical record.   Sallye Ober , Research Assistant

## 2020-05-11 NOTE — Progress Notes (Signed)
CT scan completed. Tolerated well. D/C home in wheelchair. Awake and alert. In no distress. 

## 2020-05-20 ENCOUNTER — Other Ambulatory Visit: Payer: Self-pay | Admitting: Nurse Practitioner

## 2020-05-20 DIAGNOSIS — E876 Hypokalemia: Secondary | ICD-10-CM

## 2020-05-23 ENCOUNTER — Ambulatory Visit (INDEPENDENT_AMBULATORY_CARE_PROVIDER_SITE_OTHER): Payer: Medicaid Other | Admitting: Pharmacist

## 2020-05-23 ENCOUNTER — Ambulatory Visit: Payer: Medicaid Other

## 2020-05-23 ENCOUNTER — Other Ambulatory Visit: Payer: Self-pay

## 2020-05-23 VITALS — BP 128/80 | HR 85 | Temp 98.1°F | Ht 62.0 in | Wt 231.2 lb

## 2020-05-23 VITALS — BP 128/86 | HR 67

## 2020-05-23 DIAGNOSIS — I10 Essential (primary) hypertension: Secondary | ICD-10-CM

## 2020-05-23 DIAGNOSIS — E538 Deficiency of other specified B group vitamins: Secondary | ICD-10-CM

## 2020-05-23 DIAGNOSIS — R072 Precordial pain: Secondary | ICD-10-CM

## 2020-05-23 MED ORDER — CYANOCOBALAMIN 1000 MCG/ML IJ SOLN
1000.0000 ug | Freq: Once | INTRAMUSCULAR | Status: AC
  Administered 2020-06-06: 1000 ug via INTRAMUSCULAR

## 2020-05-23 MED ORDER — VALSARTAN 160 MG PO TABS: 160.0000 mg | ORAL_TABLET | Freq: Every day | ORAL | 3 refills | Status: DC

## 2020-05-23 NOTE — Progress Notes (Signed)
Pt is here today for b12 injection.  

## 2020-05-23 NOTE — Patient Instructions (Addendum)
It was nice to see you today  Your blood pressure was close to goal <130/21mmHg  Increase your valsartan from 80mg  to 160mg  once daily  Continue taking your other medications  Stay active with walking and continue to limit your daily sodium intake to 000mg  daily  Follow up blood pressure check and lab work in 3-4 weeks

## 2020-05-23 NOTE — Progress Notes (Signed)
Patient ID: Sharon Bond                 DOB: 1965/03/16                      MRN: [id number]     HPI: Sharon Bond is a 55 y.o. female referred by Dr. Johney Frame to HTN clinic. PMH is significant for HTN, pre DM, GERD, anxiety, and hypokalemia.  Patient was seen by PCP on 3/3 for chest pain and hypertension and was started on amlodipine, subsequently discontinued due to cough. Work up with PCP including CBC, BMET, BNP were normal, EKG without ischemic changes. Symptoms mildly improved with treatment of HTN but pt reported continued chest pressure, SOB, orthopnea, and occasional palitations at visit with Dr Johney Frame on 04/06/20. BP was elevated at 134/100 at that time and pt was started on valsartan 80mg  daily as well as ischemic work up with coronary CTA and echo. Echo was normal with EF 60-65%, coronary CTA showed calcium score of 0, f/u BMET normal.  She went to the ED on 3/31 for chest pain, BP was 143/79. Described the pain as a pressure in her chest which stretched to her neck and down her left arm. EKG with occasional PVCs but did not show acute ischemic changes, trop negative x2, D dimer negative, and pt was discharged without med changes. She was seen by PharmD on 4/1 and started on Bystolic. She saw PCP 4/18 and BP was 134/80, HR 76. She was started on buspirone to help with anxiety. Due to pt's fatigue, sleep study ordered for 6/8 to evaluate for potential OSA, TSH, B12 also checked. B12 was low and pt elected to start on weekly B12 injections for a month. A1c had increased from 5.8 to 6.1, healthy lifestyle and weight loss were discussed with pt.  Patient thinks that some of her BP issues may be due to anxiety due to be out of work and over health and worry about developing dementia. Mother currently has dementia and resides in assisted living.  Pt presents today in good spirits. Reports tolerating her medications well, no missed doses. Did not purchase a new BP cuff, does not wish to check  readings at home due to this being a source of anxiety. She feels the buspirone is helping some with her anxiety so far. Denies dizziness, occasional headache. Has first B12 injection scheduled later today. Used to take ibuprofen frequently for her right arm injury last May, now off of NSAIDs.  Current HTN meds: HCTZ 25mg  daily (AM), valsartan 80mg  daily (PM), Bystolic 5mg  daily (PM) Previously tried: amlodipine (cough), olmesartan (cough), lisinopril (unsure) BP goal: <130/87mmHg  Family History: Diabetes in her brother and mother; Healthy in her father; Heart disease in her maternal aunt; Hypertension in her mother. There is no history of Cancer or Stroke.  Social History: Denies tobacco and drug use, occasional alcohol use.  Lives with her son and husband and used to work for [employer]  Diet: does not add salt to food. Cut out coffee, likes herbal tea.  Exercise: Starting to walk more.  Wt Readings from Last 3 Encounters:  05/08/20 228 lb 12.8 oz (103.8 kg)  04/06/20 226 lb 3.2 oz (102.6 kg)  03/28/20 225 lb 6.4 oz (102.2 kg)   BP Readings from Last 3 Encounters:  05/11/20 123/63  05/08/20 134/80  04/21/20 (!) 142/92   Pulse Readings from Last 3 Encounters:  05/08/20 76  04/21/20 82  04/21/20  78    Renal function: CrCl cannot be calculated (Unknown ideal weight.).  Past Medical History:  Diagnosis Date  . Eczema   . Hypertension   . IUD (intrauterine device) in place 2005   removed 2015  . Mixed incontinence   . Seasonal allergic rhinitis   . Wears glasses     Current Outpatient Medications on File Prior to Visit  Medication Sig Dispense Refill  . albuterol (VENTOLIN HFA) 108 (90 Base) MCG/ACT inhaler Inhale 2 puffs into the lungs every 6 (six) hours as needed for wheezing or shortness of breath. 8 g 2  . busPIRone (BUSPAR) 10 MG tablet Take 1 tablet (10 mg total) by mouth daily. 30 tablet 2  . EPINEPHrine 0.3 mg/0.3 mL IJ SOAJ injection Inject 0.3 mg into the muscle as  needed for anaphylaxis. 1 each 1  . hydrochlorothiazide (HYDRODIURIL) 25 MG tablet Take 1 tablet (25 mg total) by mouth daily. 90 tablet 0  . Magnesium 250 MG TABS Take 1 tablet (250 mg total) by mouth daily. With evening meal 30 tablet 2  . nebivolol (BYSTOLIC) 5 MG tablet Take 1 tablet (5 mg total) by mouth daily. 30 tablet 3  . potassium chloride (KLOR-CON) 10 MEQ tablet TAKE 1 TABLET BY MOUTH EVERY DAY 30 tablet 1  . valsartan (DIOVAN) 80 MG tablet Take 1 tablet (80 mg total) by mouth daily. 90 tablet 3   No current facility-administered medications on file prior to visit.    Allergies  Allergen Reactions  . Penicillins Swelling and Other (See Comments)    Facial swelling  . Olmesartan     "cough"  . Amlodipine Cough     Assessment/Plan:  1. Hypertension - BP has improved and is close to goal <465/03TWSF, either diastolic or systolic reading have been high on most of her recent office checks though. Does not wish to monitor BP at home due to this being a source of anxiety for her which can also contribute to HTN. Will increase valsartan from 80mg  to 160mg  daily, this should help with her hypokalemia as well. Will continue her K 65meq daily supplementation for now but hopefully can stop at next lab recheck. Continue HCTZ 25mg  daily and Bystolic 5mg  daily. Fatigue is not likely due to BP, she is pending sleep study and starting B12 injections today per PCP.  Follow up with me in 3 weeks for BP check and BMET.  [name] E. Dawanda Mapel, PharmD, BCACP, [facility] [address], [place], [place] [address] Phone: [phone]; Fax: [phone] 05/23/2020 9:57 AM

## 2020-05-28 ENCOUNTER — Other Ambulatory Visit: Payer: Self-pay | Admitting: Nurse Practitioner

## 2020-05-28 DIAGNOSIS — I10 Essential (primary) hypertension: Secondary | ICD-10-CM

## 2020-05-30 ENCOUNTER — Ambulatory Visit (INDEPENDENT_AMBULATORY_CARE_PROVIDER_SITE_OTHER): Payer: Medicaid Other | Admitting: Nurse Practitioner

## 2020-05-30 ENCOUNTER — Other Ambulatory Visit: Payer: Self-pay

## 2020-05-30 ENCOUNTER — Encounter: Payer: Self-pay | Admitting: Nurse Practitioner

## 2020-05-30 VITALS — BP 116/80 | HR 69 | Temp 98.3°F | Ht 63.0 in | Wt 226.0 lb

## 2020-05-30 DIAGNOSIS — R5383 Other fatigue: Secondary | ICD-10-CM

## 2020-05-30 DIAGNOSIS — I10 Essential (primary) hypertension: Secondary | ICD-10-CM

## 2020-05-30 DIAGNOSIS — E538 Deficiency of other specified B group vitamins: Secondary | ICD-10-CM | POA: Diagnosis not present

## 2020-05-30 DIAGNOSIS — R52 Pain, unspecified: Secondary | ICD-10-CM

## 2020-05-30 MED ORDER — CYANOCOBALAMIN 1000 MCG/ML IJ SOLN
1000.0000 ug | Freq: Once | INTRAMUSCULAR | Status: AC
  Administered 2020-05-30: 1000 ug via INTRAMUSCULAR

## 2020-05-30 NOTE — Patient Instructions (Signed)

## 2020-05-30 NOTE — Progress Notes (Signed)
I,Yamilka Roman [employer] as a Education administrator for [employer], FNP.,have documented all relevant documentation on the behalf of [name], FNP,as directed by  [name], FNP while in the presence of [name], [place]. This visit occurred during the SARS-CoV-2 public health emergency.  Safety protocols were in place, including screening questions prior to the visit, additional usage of staff PPE, and extensive cleaning of exam room while observing appropriate contact time as indicated for disinfecting solutions.  Subjective:     Patient ID: Sharon Bond , female    DOB: May 29, 1965 , 55 y.o.   MRN: [id number]   Chief Complaint  Patient presents with  . Hypertension  . B12 Injection    HPI  Patient presents today for a blood pressure f/u. For the last 3 days she had body aches and fatigue. She has changed her diet as well.   Wt Readings from Last 3 Encounters: 05/30/20 : 226 lb (102.5 kg) 05/23/20 : 231 lb 3.2 oz (104.9 kg) 05/08/20 : 228 lb 12.8 oz (103.8 kg)   Hypertension This is a chronic problem. The current episode started more than 1 year ago. The problem has been gradually improving since onset. The problem is controlled. Pertinent negatives include no anxiety, chest pain, malaise/fatigue, palpitations or shortness of breath. Risk factors for coronary artery disease include obesity.     Past Medical History:  Diagnosis Date  . Eczema   . Hypertension   . IUD (intrauterine device) in place 2005   removed 2015  . Mixed incontinence   . Seasonal allergic rhinitis   . Wears glasses      Family History  Problem Relation Age of Onset  . Diabetes Mother   . Hypertension Mother   . Healthy Father   . Diabetes Brother   . Heart disease Maternal Aunt   . Cancer Neg Hx   . Stroke Neg Hx      Current Outpatient Medications:  .  albuterol (VENTOLIN HFA) 108 (90 Base) MCG/ACT inhaler, Inhale 2 puffs into the lungs every 6 (six) hours as needed for wheezing or shortness  of breath., Disp: 8 g, Rfl: 2 .  busPIRone (BUSPAR) 10 MG tablet, Take 1 tablet (10 mg total) by mouth daily., Disp: 30 tablet, Rfl: 2 .  EPINEPHrine 0.3 mg/0.3 mL IJ SOAJ injection, Inject 0.3 mg into the muscle as needed for anaphylaxis., Disp: 1 each, Rfl: 1 .  hydrochlorothiazide (HYDRODIURIL) 25 MG tablet, TAKE 1 TABLET (25 MG TOTAL) BY MOUTH DAILY., Disp: 90 tablet, Rfl: 0 .  Magnesium 250 MG TABS, Take 1 tablet (250 mg total) by mouth daily. With evening meal, Disp: 30 tablet, Rfl: 2 .  nebivolol (BYSTOLIC) 5 MG tablet, Take 1 tablet (5 mg total) by mouth daily., Disp: 30 tablet, Rfl: 3 .  potassium chloride (KLOR-CON) 10 MEQ tablet, TAKE 1 TABLET BY MOUTH EVERY DAY, Disp: 30 tablet, Rfl: 1 .  valsartan (DIOVAN) 160 MG tablet, Take 1 tablet (160 mg total) by mouth daily., Disp: 90 tablet, Rfl: 3  Current Facility-Administered Medications:  .  cyanocobalamin ((VITAMIN B-12)) injection 1,000 mcg, 1,000 mcg, Intramuscular, Once, [name], FNP .  cyanocobalamin ((VITAMIN B-12)) injection 1,000 mcg, 1,000 mcg, Intramuscular, Once, [name], FNP   Allergies  Allergen Reactions  . Penicillins Swelling and Other (See Comments)    Facial swelling  . Olmesartan     "cough"  . Amlodipine Cough     Review of Systems  Constitutional: Negative.  Negative for malaise/fatigue.  Respiratory: Negative for apnea and shortness of breath.   Cardiovascular: Negative for chest pain, palpitations and leg swelling.  Musculoskeletal: Negative.   Psychiatric/Behavioral: Negative.      Today's Vitals   05/30/20 0953  BP: 116/80  Pulse: 69  Temp: 98.3 F (36.8 C)  TempSrc: Oral  Weight: 226 lb (102.5 kg)  Height: 5\' 3"  (1.6 m)  PainSc: 0-No pain   Body mass index is 40.03 kg/m.   Objective:  Physical Exam Vitals reviewed.  Constitutional:      General: She is not in acute distress.    Appearance: Normal appearance. She is obese.  Cardiovascular:     Rate and Rhythm: Normal rate  and regular rhythm.     Pulses: Normal pulses.     Heart sounds: Normal heart sounds. No murmur heard.   Pulmonary:     Effort: Pulmonary effort is normal. No respiratory distress.     Breath sounds: Normal breath sounds. No wheezing.  Abdominal:     General: Abdomen is flat. Bowel sounds are normal.     Palpations: Abdomen is soft.  Skin:    General: Skin is warm and dry.     Capillary Refill: Capillary refill takes less than 2 seconds.  Neurological:     General: No focal deficit present.     Mental Status: She is alert and oriented to person, place, and time.  Psychiatric:        Mood and Affect: Mood normal.        Behavior: Behavior normal.        Thought Content: Thought content normal.        Judgment: Judgment normal.         Assessment And Plan:     1. B12 deficiency  She received dose of vitamin B12 injection today does not feel has helped at this time, will have her to return in 1 week for vitamin b12.  - cyanocobalamin ((VITAMIN B-12)) injection 1,000 mcg - Vitamin B12  2. Essential hypertension  Chronic, blood pressure is well controlled at this time  She continues to be seen at the Hypertension clinic   3. Body aches  This has improved, I will check for covid due to the vague symptoms.   She did have her covid booster 2-3 weeks ago - Novel Coronavirus, NAA ([facility])  4. Other fatigue  Will check covid test.   5. Obesity, Class III, BMI 40-49.9 (morbid obesity) (HCC)  Chronic  Discussed healthy diet and regular exercise options   She is encouraged to strive for BMI less than 30 to decrease cardiac risk. Advised to aim for at least 150 minutes of exercise per week.  Patient was given opportunity to ask questions. Patient verbalized understanding of the plan and was able to repeat key elements of the plan. All questions were answered to their satisfaction.  [name], FNP   I, [name], FNP, have reviewed all documentation for this  visit. The documentation on 05/30/20 for the exam, diagnosis, procedures, and orders are all accurate and complete.   IF YOU HAVE BEEN REFERRED TO A SPECIALIST, IT MAY TAKE 1-2 WEEKS TO SCHEDULE/PROCESS THE REFERRAL. IF YOU HAVE NOT HEARD FROM US/SPECIALIST IN TWO WEEKS, PLEASE GIVE [place] A CALL AT [phone].   THE PATIENT IS ENCOURAGED TO PRACTICE SOCIAL DISTANCING DUE TO THE COVID-19 PANDEMIC.

## 2020-05-31 ENCOUNTER — Other Ambulatory Visit: Payer: Self-pay | Admitting: Nurse Practitioner

## 2020-05-31 DIAGNOSIS — F419 Anxiety disorder, unspecified: Secondary | ICD-10-CM

## 2020-05-31 LAB — SARS-COV-2, NAA 2 DAY TAT

## 2020-05-31 LAB — NOVEL CORONAVIRUS, NAA: SARS-CoV-2, NAA: NOT DETECTED

## 2020-06-06 ENCOUNTER — Ambulatory Visit (INDEPENDENT_AMBULATORY_CARE_PROVIDER_SITE_OTHER): Payer: Medicaid Other

## 2020-06-06 ENCOUNTER — Other Ambulatory Visit: Payer: Self-pay

## 2020-06-06 VITALS — BP 132/80 | HR 71 | Temp 98.7°F | Ht 63.0 in | Wt 228.2 lb

## 2020-06-06 DIAGNOSIS — E538 Deficiency of other specified B group vitamins: Secondary | ICD-10-CM | POA: Diagnosis not present

## 2020-06-06 MED ORDER — CYANOCOBALAMIN 1000 MCG/ML IJ SOLN
1000.0000 ug | Freq: Once | INTRAMUSCULAR | Status: AC
  Administered 2020-06-06: 1000 ug via INTRAMUSCULAR

## 2020-06-06 NOTE — Progress Notes (Signed)
Pt here for b12 injection

## 2020-06-08 ENCOUNTER — Ambulatory Visit: Payer: Medicaid Other | Admitting: Nurse Practitioner

## 2020-06-13 ENCOUNTER — Other Ambulatory Visit: Payer: Self-pay

## 2020-06-13 ENCOUNTER — Ambulatory Visit (INDEPENDENT_AMBULATORY_CARE_PROVIDER_SITE_OTHER): Payer: Medicaid Other

## 2020-06-13 VITALS — BP 130/70 | HR 73 | Temp 98.1°F | Ht 63.0 in | Wt 228.0 lb

## 2020-06-13 DIAGNOSIS — E538 Deficiency of other specified B group vitamins: Secondary | ICD-10-CM | POA: Diagnosis not present

## 2020-06-13 MED ORDER — CYANOCOBALAMIN 1000 MCG/ML IJ SOLN
1000.0000 ug | Freq: Once | INTRAMUSCULAR | Status: AC
  Administered 2020-06-13: 1000 ug via INTRAMUSCULAR

## 2020-06-13 NOTE — Progress Notes (Signed)
Patient is here for 4th b12 injection. She will have labs drawn in a week.

## 2020-06-15 ENCOUNTER — Other Ambulatory Visit: Payer: Self-pay | Admitting: Nurse Practitioner

## 2020-06-15 ENCOUNTER — Ambulatory Visit (INDEPENDENT_AMBULATORY_CARE_PROVIDER_SITE_OTHER): Payer: Medicaid Other | Admitting: Pharmacist

## 2020-06-15 ENCOUNTER — Encounter: Payer: Self-pay | Admitting: Pharmacist

## 2020-06-15 ENCOUNTER — Other Ambulatory Visit: Payer: Self-pay

## 2020-06-15 VITALS — BP 114/78 | HR 71

## 2020-06-15 DIAGNOSIS — I10 Essential (primary) hypertension: Secondary | ICD-10-CM

## 2020-06-15 DIAGNOSIS — E876 Hypokalemia: Secondary | ICD-10-CM

## 2020-06-15 LAB — BASIC METABOLIC PANEL
BUN/Creatinine Ratio: 14 (ref 9–23)
BUN: 12 mg/dL (ref 6–24)
CO2: 27 mmol/L (ref 20–29)
Calcium: 9.6 mg/dL (ref 8.7–10.2)
Chloride: 98 mmol/L (ref 96–106)
Creatinine, Ser: 0.84 mg/dL (ref 0.57–1.00)
Glucose: 96 mg/dL (ref 65–99)
Potassium: 4.1 mmol/L (ref 3.5–5.2)
Sodium: 141 mmol/L (ref 134–144)
eGFR: 83 mL/min/{1.73_m2} (ref >59)

## 2020-06-15 NOTE — Patient Instructions (Addendum)
It was nice seeing you again!  Your blood pressure goal is less than 130/80 so your blood pressure today looks great  Continue your valsartan 160mg  daily, nebivolol 5 mg daily, and your hydrochlorothiazide 25mg  daily  Continue to watch your salt intake and follow a healthy diet  We will check your lab work today   Please call if there are any questions!  Karren Cobble, PharmD, BCACP, [place], [place] [address] [place], [place] [address] Phone: [phone]; Fax: [phone] 06/15/2020 9:19 AM

## 2020-06-15 NOTE — Progress Notes (Signed)
Patient ID: Sharon Bond                 DOB: May 18, 1965                      MRN: [id number]     HPI: Sharon Bond is a 55 y.o. female referred by Dr. Johney Frame to HTN clinic. PMH is significant for HTN, pre DM, GERD, anxiety, and hypokalemia.  Patient was seen by PCP on 3/3 for chest pain and hypertension and was started on amlodipine, subsequently discontinued due to cough. Work up with PCP including CBC, BMET, BNP were normal, EKG without ischemic changes. Symptoms mildly improved with treatment of HTN but pt reported continued chest pressure, SOB, orthopnea, and occasional palitations at visit with Dr Johney Frame on 04/06/20. BP was elevated at 134/100 at that time and pt was started on valsartan 80mg  daily as well as ischemic work up with coronary CTA and echo. Echo was normal with EF 60-65%, coronary CTA showed calcium score of 0, f/u BMET normal.  At last visit with PharmD, valsartan was increased to 160mg  daily. Patient reported occasional headaches and was planning on starting B12 injections at PCP.  Patient presents today in good spirits.  Reports systolic BP at visit with PCP was 118, did not remember diastolic.  Still has occasional headaches.  B12 injections have not helped fatigue unfortunately.    Is tolerating increase in valsartan as well as HCTZ and Bystolic.  Is trying to switch to plant based diet to help with BP.  Is not adding salt to food and drinks decaf coffee on only on weekends.   Is due for BMP today to check K level and renal function   Current HTN meds: valsartan 160mg  daily, Bystolic 5mg  daily,HCTZ 25mg  daily Previously tried: olmesartan, lisinopril, amlodipine BP goal: <130/80   Wt Readings from Last 3 Encounters:  06/13/20 228 lb (103.4 kg)  06/06/20 228 lb 3.2 oz (103.5 kg)  05/30/20 226 lb (102.5 kg)   BP Readings from Last 3 Encounters:  06/13/20 130/70  06/06/20 132/80  05/30/20 116/80   Pulse Readings from Last 3 Encounters:  06/13/20 73  06/06/20  71  05/30/20 69    Renal function: CrCl cannot be calculated (Patient's most recent lab result is older than the maximum 21 days allowed.).  Past Medical History:  Diagnosis Date  . Eczema   . Hypertension   . IUD (intrauterine device) in place 2005   removed 2015  . Mixed incontinence   . Seasonal allergic rhinitis   . Wears glasses     Current Outpatient Medications on File Prior to Visit  Medication Sig Dispense Refill  . Magnesium Oxide 250 MG TABS     . metoprolol tartrate (LOPRESSOR) 100 MG tablet     . olmesartan (BENICAR) 5 MG tablet     . albuterol (VENTOLIN HFA) 108 (90 Base) MCG/ACT inhaler Inhale 2 puffs into the lungs every 6 (six) hours as needed for wheezing or shortness of breath. 8 g 2  . busPIRone (BUSPAR) 10 MG tablet TAKE 1 TABLET BY MOUTH EVERY DAY 90 tablet 1  . EPINEPHrine 0.3 mg/0.3 mL IJ SOAJ injection Inject 0.3 mg into the muscle as needed for anaphylaxis. 1 each 1  . hydrochlorothiazide (HYDRODIURIL) 25 MG tablet TAKE 1 TABLET (25 MG TOTAL) BY MOUTH DAILY. 90 tablet 0  . Magnesium 250 MG TABS Take 1 tablet (250 mg total) by mouth daily. With evening meal  30 tablet 2  . nebivolol (BYSTOLIC) 5 MG tablet Take 1 tablet (5 mg total) by mouth daily. 30 tablet 3  . potassium chloride (KLOR-CON) 10 MEQ tablet TAKE 1 TABLET BY MOUTH EVERY DAY 30 tablet 1  . valsartan (DIOVAN) 160 MG tablet Take 1 tablet (160 mg total) by mouth daily. 90 tablet 3   No current facility-administered medications on file prior to visit.    Allergies  Allergen Reactions  . Penicillins Swelling and Other (See Comments)    Facial swelling  . Olmesartan     "cough"  . Amlodipine Cough     Assessment/Plan:  1. Hypertension - Patient BP in room today 114/78 which is at goal of <130/80.  Patient tolerating all current medications, no med changes needed at this time.  Recommended patient continue to watch sodium intake.  Will check BMP today.  No recheck needed at this time.  Patient will call if she feels BP is increasing. Has appt with Dr. Johney Frame in late June.  Continue HCTZ 25mg  daily Continue nebivolol 5 mg daily Continue valsartan 160mg  daily Check BMP Recheck as needed  Karren Cobble, PharmD, BCACP, [place], [place] [address] [place], [place] [address] Phone: [phone]; Fax: [phone] 06/15/2020 9:48 AM

## 2020-06-17 ENCOUNTER — Other Ambulatory Visit: Payer: Self-pay | Admitting: Nurse Practitioner

## 2020-06-20 ENCOUNTER — Other Ambulatory Visit: Payer: Medicaid Other

## 2020-06-20 ENCOUNTER — Other Ambulatory Visit: Payer: Self-pay

## 2020-06-21 LAB — VITAMIN B12: Vitamin B-12: 842 pg/mL (ref 232–1245)

## 2020-06-26 ENCOUNTER — Encounter: Payer: Self-pay | Admitting: Nurse Practitioner

## 2020-06-28 ENCOUNTER — Ambulatory Visit: Payer: Medicaid Other | Admitting: Neurology

## 2020-06-28 ENCOUNTER — Encounter: Payer: Self-pay | Admitting: Neurology

## 2020-06-28 VITALS — BP 120/79 | HR 73 | Ht 62.0 in | Wt 231.0 lb

## 2020-06-28 DIAGNOSIS — R0683 Snoring: Secondary | ICD-10-CM | POA: Diagnosis not present

## 2020-06-28 DIAGNOSIS — Z6841 Body Mass Index (BMI) 40.0 and over, adult: Secondary | ICD-10-CM | POA: Insufficient documentation

## 2020-06-28 DIAGNOSIS — F5104 Psychophysiologic insomnia: Secondary | ICD-10-CM | POA: Diagnosis not present

## 2020-06-28 DIAGNOSIS — G8929 Other chronic pain: Secondary | ICD-10-CM | POA: Diagnosis not present

## 2020-06-28 DIAGNOSIS — G4701 Insomnia due to medical condition: Secondary | ICD-10-CM | POA: Diagnosis not present

## 2020-06-28 MED ORDER — TRAZODONE HCL 50 MG PO TABS: 25.0000 mg | ORAL_TABLET | Freq: Every evening | ORAL | 1 refills | Status: DC | PRN

## 2020-06-28 NOTE — Patient Instructions (Signed)
Trazodone Tablets What is this medicine? TRAZODONE (TRAZ oh done) is used to treat depression. This medicine may be used for other purposes; ask your health care provider or pharmacist if you have questions. COMMON BRAND NAME(S): Desyrel What should I tell my health care provider before I take this medicine? They need to know if you have any of these conditions:  attempted suicide or thinking about it  bipolar disorder  bleeding problems  glaucoma  heart disease, or previous heart attack  irregular heart beat  kidney or liver disease  low levels of sodium in the blood  an unusual or allergic reaction to trazodone, other medicines, foods, dyes or preservatives  pregnant or trying to get pregnant  breast-feeding How should I use this medicine? Take this medicine by mouth with a glass of water. Follow the directions on the prescription label. Take this medicine shortly after a meal or a light snack. Take your medicine at regular intervals. Do not take your medicine more often than directed. Do not stop taking this medicine suddenly except upon the advice of your doctor. Stopping this medicine too quickly may cause serious side effects or your condition may worsen. A special MedGuide will be given to you by the pharmacist with each prescription and refill. Be sure to read this information carefully each time. Talk to your pediatrician regarding the use of this medicine in children. Special care may be needed. Overdosage: If you think you have taken too much of this medicine contact a poison control center or emergency room at once. NOTE: This medicine is only for you. Do not share this medicine with others. What if I miss a dose? If you miss a dose, take it as soon as you can. If it is almost time for your next dose, take only that dose. Do not take double or extra doses. What may interact with this medicine? Do not take this medicine with any of the following  medications:  certain medicines for fungal infections like fluconazole, itraconazole, ketoconazole, posaconazole, voriconazole  cisapride  dronedarone  linezolid  MAOIs like Carbex, Eldepryl, Marplan, Nardil, and Parnate  mesoridazine  methylene blue (injected into a vein)  pimozide  saquinavir  thioridazine This medicine may also interact with the following medications:  alcohol  antiviral medicines for HIV or AIDS  aspirin and aspirin-like medicines  barbiturates like phenobarbital  certain medicines for blood pressure, heart disease, irregular heart beat  certain medicines for depression, anxiety, or psychotic disturbances  certain medicines for migraine headache like almotriptan, eletriptan, frovatriptan, naratriptan, rizatriptan, sumatriptan, zolmitriptan  certain medicines for seizures like carbamazepine and phenytoin  certain medicines for sleep  certain medicines that treat or prevent blood clots like dalteparin, enoxaparin, warfarin  digoxin  fentanyl  lithium  NSAIDS, medicines for pain and inflammation, like ibuprofen or naproxen  other medicines that prolong the QT interval (cause an abnormal heart rhythm) like dofetilide  rasagiline  supplements like St. John's wort, kava kava, valerian  tramadol  tryptophan This list may not describe all possible interactions. Give your health care provider a list of all the medicines, herbs, non-prescription drugs, or dietary supplements you use. Also tell them if you smoke, drink alcohol, or use illegal drugs. Some items may interact with your medicine. What should I watch for while using this medicine? Tell your doctor if your symptoms do not get better or if they get worse. Visit your doctor or health care professional for regular checks on your progress. Because it may take  several weeks to see the full effects of this medicine, it is important to continue your treatment as prescribed by your  doctor. Patients and their families should watch out for new or worsening thoughts of suicide or depression. Also watch out for sudden changes in feelings such as feeling anxious, agitated, panicky, irritable, hostile, aggressive, impulsive, severely restless, overly excited and hyperactive, or not being able to sleep. If this happens, especially at the beginning of treatment or after a change in dose, call your health care professional. Dennis Bast may get drowsy or dizzy. Do not drive, use machinery, or do anything that needs mental alertness until you know how this medicine affects you. Do not stand or sit up quickly, especially if you are an older patient. This reduces the risk of dizzy or fainting spells. Alcohol may interfere with the effect of this medicine. Avoid alcoholic drinks. This medicine may cause dry eyes and blurred vision. If you wear contact lenses you may feel some discomfort. Lubricating drops may help. See your eye doctor if the problem does not go away or is severe. Your mouth may get dry. Chewing sugarless gum, sucking hard candy and drinking plenty of water may help. Contact your doctor if the problem does not go away or is severe. What side effects may I notice from receiving this medicine? Side effects that you should report to your doctor or health care professional as soon as possible:  allergic reactions like skin rash, itching or hives, swelling of the face, lips, or tongue  elevated mood, decreased need for sleep, racing thoughts, impulsive behavior  confusion  fast, irregular heartbeat  feeling faint or lightheaded, falls  feeling agitated, angry, or irritable  loss of balance or coordination  painful or prolonged erections  restlessness, pacing, inability to keep still  suicidal thoughts or other mood changes  tremors  trouble sleeping  seizures  unusual bleeding or bruising Side effects that usually do not require medical attention (report to your doctor  or health care professional if they continue or are bothersome):  change in sex drive or performance  change in appetite or weight  constipation  headache  muscle aches or pains  nausea This list may not describe all possible side effects. Call your doctor for medical advice about side effects. You may report side effects to FDA at 1-800-FDA-1088. Where should I keep my medicine? Keep out of the reach of children. Store at room temperature between 15 and 30 degrees C (59 to 86 degrees F). Protect from light. Keep container tightly closed. Throw away any unused medicine after the expiration date. NOTE: This sheet is a summary. It may not cover all possible information. If you have questions about this medicine, talk to your doctor, pharmacist, or health care provider.  2021 Elsevier/Gold Standard (2019-11-29 14:46:11) Insomnia Insomnia is a sleep disorder that makes it difficult to fall asleep or stay asleep. Insomnia can cause fatigue, low energy, difficulty concentrating, mood swings, and poor performance at work or school. There are three different ways to classify insomnia:  Difficulty falling asleep.  Difficulty staying asleep.  Waking up too early in the morning. Any type of insomnia can be long-term (chronic) or short-term (acute). Both are common. Short-term insomnia usually lasts for three months or less. Chronic insomnia occurs at least three times a week for longer than three months. What are the causes? Insomnia may be caused by another condition, situation, or substance, such as:  Anxiety.  Certain medicines.  Gastroesophageal reflux disease (  GERD) or other gastrointestinal conditions.  Asthma or other breathing conditions.  Restless legs syndrome, sleep apnea, or other sleep disorders.  Chronic pain.  Menopause.  Stroke.  Abuse of alcohol, tobacco, or illegal drugs.  Mental health conditions, such as depression.  Caffeine.  Neurological disorders,  such as Alzheimer's disease.  An overactive thyroid (hyperthyroidism). Sometimes, the cause of insomnia may not be known. What increases the risk? Risk factors for insomnia include:  Gender. Women are affected more often than men.  Age. Insomnia is more common as you get older.  Stress.  Lack of exercise.  Irregular work schedule or working night shifts.  Traveling between different time zones.  Certain medical and mental health conditions. What are the signs or symptoms? If you have insomnia, the main symptom is having trouble falling asleep or having trouble staying asleep. This may lead to other symptoms, such as:  Feeling fatigued or having low energy.  Feeling nervous about going to sleep.  Not feeling rested in the morning.  Having trouble concentrating.  Feeling irritable, anxious, or depressed. How is this diagnosed? This condition may be diagnosed based on:  Your symptoms and medical history. Your health care provider may ask about: ? Your sleep habits. ? Any medical conditions you have. ? Your mental health.  A physical exam. How is this treated? Treatment for insomnia depends on the cause. Treatment may focus on treating an underlying condition that is causing insomnia. Treatment may also include:  Medicines to help you sleep.  Counseling or therapy.  Lifestyle adjustments to help you sleep better. Follow these instructions at home: Eating and drinking  Limit or avoid alcohol, caffeinated beverages, and cigarettes, especially close to bedtime. These can disrupt your sleep.  Do not eat a large meal or eat spicy foods right before bedtime. This can lead to digestive discomfort that can make it hard for you to sleep.   Sleep habits  Keep a sleep diary to help you and your health care provider figure out what could be causing your insomnia. Write down: ? When you sleep. ? When you wake up during the night. ? How well you sleep. ? How rested you feel  the next day. ? Any side effects of medicines you are taking. ? What you eat and drink.  Make your bedroom a dark, comfortable place where it is easy to fall asleep. ? Put up shades or blackout curtains to block light from outside. ? Use a white noise machine to block noise. ? Keep the temperature cool.  Limit screen use before bedtime. This includes: ? Watching TV. ? Using your smartphone, tablet, or computer.  Stick to a routine that includes going to bed and waking up at the same times every day and night. This can help you fall asleep faster. Consider making a quiet activity, such as reading, part of your nighttime routine.  Try to avoid taking naps during the day so that you sleep better at night.  Get out of bed if you are still awake after 15 minutes of trying to sleep. Keep the lights down, but try reading or doing a quiet activity. When you feel sleepy, go back to bed.   General instructions  Take over-the-counter and prescription medicines only as told by your health care provider.  Exercise regularly, as told by your health care provider. Avoid exercise starting several hours before bedtime.  Use relaxation techniques to manage stress. Ask your health care provider to suggest some techniques that may  work well for you. These may include: ? Breathing exercises. ? Routines to release muscle tension. ? Visualizing peaceful scenes.  Make sure that you drive carefully. Avoid driving if you feel very sleepy.  Keep all follow-up visits as told by your health care provider. This is important. Contact a health care provider if:  You are tired throughout the day.  You have trouble in your daily routine due to sleepiness.  You continue to have sleep problems, or your sleep problems get worse. Get help right away if:  You have serious thoughts about hurting yourself or someone else. If you ever feel like you may hurt yourself or others, or have thoughts about taking your own  life, get help right away. You can go to your nearest emergency department or call:  Your local emergency services (911 in the [place]  A suicide crisis helpline, such as the [facility] at 220-556-8837. This is open 24 hours a day. Summary  Insomnia is a sleep disorder that makes it difficult to fall asleep or stay asleep.  Insomnia can be long-term (chronic) or short-term (acute).  Treatment for insomnia depends on the cause. Treatment may focus on treating an underlying condition that is causing insomnia.  Keep a sleep diary to help you and your health care provider figure out what could be causing your insomnia. This information is not intended to replace advice given to you by your health care provider. Make sure you discuss any questions you have with your health care provider. Document Revised: 11/18/2019 Document Reviewed: 11/18/2019 Elsevier Patient Education  2021 [place]. Chronic Fatigue Syndrome Chronic fatigue syndrome (CFS) is a condition that causes extreme tiredness (fatigue). This condition is also known as myalgic encephalomyelitis (ME). The fatigue in CFS does not improve with rest, and it gets worse with physical or mental activity. Several other symptoms may occur along with fatigue. Symptoms may come and go, but they generally last for months. Sometimes, CFS gets better over time. In other cases, it can be a lifelong condition. There is no cure, but there are many possible treatments. You will need to work with your health care providers to find a treatment plan that works best for you. What are the causes? The cause of this condition is not known. CFS may be caused by a combination of things. Possible causes include:  An infection.  An abnormal body defense system (abnormal immune system).  Low blood pressure.  Poor diet.  Living with a lot of physical or emotional stress. What increases the risk? The following factors may make  you more likely to develop this condition:  Being female.  Being 22-63 years old.  Having a family history of CFS. What are the signs or symptoms? The main symptom of this condition is fatigue that is severe enough to interfere with day-to-day activities. This fatigue does not get better with rest, and it gets worse with physical or mental activity. There are eight other major symptoms of CFS:  Discomfort and lack of energy (malaise) that lasts more than 24 hours after physical activity.  Sleep that does not relieve fatigue (unrefreshing sleep).  Short-term memory loss or confusion.  Joint pain without redness or swelling.  Muscle aches.  Headaches.  Painful and swollen glands (lymph nodes) in the neck or under the arms.  Sore throat. Other symptoms can include:  Cramps in the abdomen, constipation, or diarrhea (irritable bowel syndrome).  Chills and night sweats.  Vision changes.  Dizziness and  mental confusion (brain fog).  Clumsiness.  Sensitivity to food, noise, or odors.  Mood swings, depression, or anxiety attacks. How is this diagnosed? There are no tests that can diagnose this condition. Your health care provider will make the diagnosis based on:  Your symptoms and medical history.  A physical exam and a mental health exam.  Tests to rule out other conditions. It is important to make sure that your symptoms are not caused by another medical condition. Tests may include lab tests or X-rays. For your health care provider to diagnose CFS:  You must have had fatigue for at least 6 straight months.  Fatigue must be your first symptom, and it must be severe enough to interfere with day-to-day activities.  You must also have at least four of the eight other major symptoms of CFS.  There must be no other cause found for the fatigue. How is this treated? There is no cure for CFS. The condition affects everyone differently. You will need to work with your team  of health care providers to find the best treatments for your symptoms. Your team may include your primary care provider, physical and exercise therapists, and mental health therapists. Treatment may include:  Having a regular bedtime routine to help improve your sleep.  Avoiding caffeine, alcohol, and tobacco or nicotine products.  Doing light exercise and stretching during the day. You may also want to try movement exercises, such as yoga or tai chi.  Taking medicines to help you sleep or to relieve joint or muscle pain.  Learning and practicing relaxation techniques, such as deep breathing and muscle relaxation.  Using memory aids or doing brainteasers to improve memory and concentration.  Getting care for your body and mental well-being, such as: ? Seeing a mental health therapist to evaluate and treat depression, if necessary. ? Cognitive behavioral therapy (CBT). This therapy changes the way you think or act in response to the fatigue. This may help improve how you feel. ? Trying massage therapy and acupuncture.   Follow these instructions at home: Eating and drinking  Avoid caffeine and alcohol.  Avoid heavy meals in the evening.  Eat a healthy diet that includes foods such as vegetables, fruits, fish, and lean meats.   Activity  Rest as told by your health care provider.  Avoid fatigue by pacing yourself during the day and getting enough sleep at night.  Exercise regularly, as told by your health care provider.  Go to bed and get up at the same time every day. Lifestyle  Ask your health care provider whether you should keep a diary. Your health care provider will tell you what information to write in the diary. This may include when you have fatigue and how medicines and other behaviors or treatments help to reduce the fatigue.  Consider joining a CFS support group.  Avoid stress and use stress-reducing techniques that you learn in therapy. General  instructions  Take over-the-counter and prescription medicines only as told by your health care provider.  Do not use herbal or dietary supplements unless they are approved by your health care provider.  Maintain a healthy weight.  Do not use any products that contain nicotine or tobacco, such as cigarettes, e-cigarettes, and chewing tobacco. If you need help quitting, ask your health care provider.  Keep all follow-up visits as told by your health care provider. This is important.   Where to find more information Get more information or find a support group near you at  one of these links:  American Myalgic Encephalomyelitis and Chronic Fatigue Syndrome Society: ammes.org  Centers for Disease Control and Prevention: http://www.wolf.info/ Contact a health care provider if:  Your symptoms do not get better or they get worse.  You feel angry, guilty, anxious, or depressed. Get help right away if:  You have thoughts of self-harm. If you ever feel like you may hurt yourself or others, or have thoughts about taking your own life, get help right away. Go to your nearest emergency department or:  Call your local emergency services (911 in the [place]  Call a suicide crisis helpline, such as the [facility] at (930)505-3026. This is open 24 hours a day in the [place]  Text the Crisis Text Line at 780 835 2297 (in the [place].). Summary  Chronic fatigue syndrome (CFS) is a condition that causes extreme tiredness (fatigue). This fatigue does not improve with rest, and it gets worse with physical or mental activity.  There is no cure for CFS. The condition affects everyone differently. You will need to work with your team of health care providers to find the best treatments for your symptoms.  Exercise regularly, as told by your health care provider. Avoid stress and use stress-reducing techniques that you learn in therapy.  Contact a health care provider if your symptoms do not get  better or they get worse. This information is not intended to replace advice given to you by your health care provider. Make sure you discuss any questions you have with your health care provider. Document Revised: 12/21/2018 Document Reviewed: 12/21/2018 Elsevier Patient Education  2021 [employer].

## 2020-06-28 NOTE — Progress Notes (Signed)
SLEEP MEDICINE CLINIC    Provider:  Larey Seat, MD  Primary Care Physician:  Sharon Bond, Sharon Bond [id number]     Referring Provider: Minette Bond, [place] Sharon Bond,  Sharon Bond [id number]          Chief Complaint according to patient   Patient presents with:    . New Patient (Initial Visit)     trouble falling and staying asleep, daytime fatigue and sleepiness, obesity.       HISTORY OF PRESENT ILLNESS:  Sharon Bond is a 55 y.o. African- American  female patient and was  seen here on 06/28/2020 upon referral by Sharon Brine, NP.  Chief concern according to patient :  see above , trouble loosing weight, poor sleep quality, non restorative.  Patient is here for sleep consult for concerns of possible sleep apnea. Patient has daytime fatigue and difficulty falling asleep. Patient reports if she doesn't take Melatonin she gets about 4 hours of sleep at night. Sleep in high school was very different, she was in bed at 8 PM and up at 6 AM, but later with work and kids, things have changed.   [name] has a past medical history of MORBID Obesity, Nocturia, Eczema, Hypertension, IUD (intrauterine device) in place (2005), Mixed incontinence, Seasonal allergic rhinitis, and Wears glasses.      Sleep relevant medical history: Nocturia 1-2 times at night , morning headaches , vivid dreams, obesity, rocking herself to sleep-     Family medical /sleep history: no other family member on CPAP with OSA, insomnia, sleep walkers. Has multiple siblings, none with apnea. Father is active and healthy, mother is demented.     Social history:  Patient is working at [employer] and currently on a medical leave-  Work related injury to the arm. and lives in a household with 2 other family members, youngest child is a 89 year-old son. 2 older children are independent . Family status is married , has 5 grandchildren. The patient currently used to  work in shifts( night/ rotating,) Tobacco use; none .  ETOH use; rarely,  Caffeine intake in form of Coffee( decaffeineated) Soda(/) T. Regular exercise in form of walking, up to three mile a day      Sleep habits are as follows: The patient's dinner time is between 6-7 PM. The patient goes to bed at 10PM and continues to struggle to sleep for many hours, wakes for 1-2 bathroom breaks, reaching a total sleep time of 3-6 hours.   The preferred sleep position is on her sides, with the support of 1 pillow. Dreams are reportedly frequent/vivid.  6 AM is the usual rise time. The patient wakes up spontaneously. He/ She reports not feeling refreshed or restored in AM, with symptoms such as dry mouth, dry eyes, morning headaches, and residual fatigue.  Naps are taken rarely as  these do not refresh-.    Review of Systems: Out of a complete 14 system review, the patient complains of only the following symptoms, and all other reviewed systems are negative.:  Fatigue, sleepiness , snoring, fragmented sleep, Insomnia , chronic and severe, was a shift worker until recently- circadian.    How likely are you to doze in the following situations: 0 = not likely, 1 = slight chance, 2 = moderate chance, 3 = high chance   Sitting and Reading? Watching Television? Sitting inactive in a public place (theater or  meeting)? As a passenger in a car for an hour without a break? Lying down in the afternoon when circumstances permit? Sitting and talking to someone? Sitting quietly after lunch without alcohol? In a car, while stopped for a few minutes in traffic?   Total = 5 / 24 points   FSS endorsed at 47/ 63 points.   Social History   Socioeconomic History  . Marital status: Married    Spouse name: Not on file  . Number of children: Not on file  . Years of education: Not on file  . Highest education level: Not on file  Occupational History  . Not on file  Tobacco Use  . Smoking status: Never Smoker   . Smokeless tobacco: Never Used  Vaping Use  . Vaping Use: Never used  Substance and Sexual Activity  . Alcohol use: Yes    Comment: occasionally  . Drug use: No  . Sexual activity: Yes    Partners: Male    Birth control/protection: Surgical  Other Topics Concern  . Not on file  Social History Narrative   married, 41 yo son, 35 yo, 54 yo, exercise - some with walking   Lives at home with spouse and 6 y.o. son   Left handed   Caffeine: decaf coffee on the weekend    Social Determinants of Health   Financial Resource Strain: Not on file  Food Insecurity: Not on file  Transportation Needs: Not on file  Physical Activity: Not on file  Stress: Not on file  Social Connections: Not on file    Family History  Problem Relation Age of Onset  . Diabetes Mother   . Hypertension Mother   . Healthy Father   . Diabetes Brother   . Heart disease Maternal Aunt   . Cancer Neg Hx   . Stroke Neg Hx   . Sleep apnea Neg Hx     Past Medical History:  Diagnosis Date  . Eczema   . Hypertension   . IUD (intrauterine device) in place 2005   removed 2015  . Mixed incontinence   . Seasonal allergic rhinitis   . Wears glasses     Past Surgical History:  Procedure Laterality Date  . DILATATION & CURETTAGE/HYSTEROSCOPY WITH TRUECLEAR N/A 01/08/2013   Procedure: DILATATION & CURETTAGE/HYSTEROSCOPY WITH TRUCLEAR, polypectomy;  Surgeon: Claiborne Billings A. Pamala Hurry, MD;  Location: [facility];  Service: Gynecology;  Laterality: N/A;  . LAPAROSCOPIC TUBAL LIGATION Bilateral 01/08/2013   Procedure: DIAGNOSTIC LAPAROSCOPY Bilateral Salpingectomy;  Surgeon: Claiborne Billings A. Pamala Hurry, MD;  Location: [facility];  Service: Gynecology;  Laterality: Bilateral;  . NO PAST SURGERIES    . WISDOM TOOTH EXTRACTION       Current Outpatient Medications on File Prior to Visit  Medication Sig Dispense Refill  . albuterol (VENTOLIN HFA) 108 (90 Base) MCG/ACT inhaler Inhale 2 puffs into the lungs every 6 (six) hours as needed for  wheezing or shortness of breath. 8 g 2  . busPIRone (BUSPAR) 10 MG tablet TAKE 1 TABLET BY MOUTH EVERY DAY 90 tablet 1  . [facility] MAGNESIUM OXIDE 250 MG TABS TAKE 1 TABLET (250 MG TOTAL) BY MOUTH DAILY. WITH EVENING MEAL 90 tablet 1  . EPINEPHrine 0.3 mg/0.3 mL IJ SOAJ injection Inject 0.3 mg into the muscle as needed for anaphylaxis. 1 each 1  . hydrochlorothiazide (HYDRODIURIL) 25 MG tablet TAKE 1 TABLET (25 MG TOTAL) BY MOUTH DAILY. 90 tablet 0  . metoprolol tartrate (LOPRESSOR) 100 MG tablet     .  nebivolol (BYSTOLIC) 5 MG tablet Take 1 tablet (5 mg total) by mouth daily. 30 tablet 3  . potassium chloride (KLOR-CON) 10 MEQ tablet TAKE 1 TABLET BY MOUTH EVERY DAY 30 tablet 1  . valsartan (DIOVAN) 160 MG tablet Take 1 tablet (160 mg total) by mouth daily. 90 tablet 3   No current facility-administered medications on file prior to visit.    Allergies  Allergen Reactions  . Penicillins Swelling and Other (See Comments)    Facial swelling  . Olmesartan     "cough"  . Amlodipine Cough    Physical exam:  Today's Vitals   06/28/20 1305  BP: 120/79  Pulse: 73  Weight: 231 lb (104.8 kg)  Height: 5\' 2"  (1.575 m)   Body mass index is 42.25 kg/m.   Wt Readings from Last 3 Encounters:  06/28/20 231 lb (104.8 kg)  06/13/20 228 lb (103.4 kg)  06/06/20 228 lb 3.2 oz (103.5 kg)     Ht Readings from Last 3 Encounters:  06/28/20 5\' 2"  (1.575 m)  06/13/20 5\' 3"  (1.6 m)  06/06/20 5\' 3"  (1.6 m)      General: The patient is awake, alert and appears not in acute distress. The patient is well groomed. Head: Normocephalic, atraumatic. Neck is supple. Mallampati 3- very low- ,  neck circumference:16 inches . Nasal airflow patent.  Retrognathia is not seen.  Dental status: biological Cardiovascular:  Regular rate and cardiac rhythm by pulse,  without distended neck veins. Respiratory: Lungs are clear to auscultation.  Skin:  Without evidence of ankle edema, or rash. Trunk: The patient's  posture is erect.   Neurologic exam : The patient is awake and alert, oriented to place and time.   Memory subjective described as intact.  Attention span & concentration ability appears normal.  Speech is fluent,  without  dysarthria, dysphonia or aphasia.  Mood and affect are appropriate.   Cranial nerves: no loss of smell or taste reported  Pupils are equal and briskly reactive to light. Funduscopic exam deferred.  Extraocular movements in vertical and horizontal planes were intact and without nystagmus. No Diplopia. Visual fields by finger perimetry are intact. Hearing impaired on the right side, intact bone conduction, no tinnitus. 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     Facial sensation intact to fine touch.  Facial motor strength is symmetric and tongue and uvula move midline.  Neck ROM : rotation, tilt and flexion extension were normal for age and shoulder shrug was symmetrical.    Motor exam:  Symmetric bulk, tone and ROM.   Normal tone without cog wheeling, right arm / hand has less grip strength . She didn't flex the right index finger and reports pain with pinch maneuver.    Sensory:  Fine touch and vibration were normal.  Proprioception tested in the upper extremities was normal. No pronator-drift.   Coordination: Rapid alternating movements in the fingers/hands were of normal speed.  The Finger-to-nose maneuver was intact without evidence of ataxia, dysmetria or tremor.   Gait and station: Patient could rise unassisted from a seated position, walked without assistive device.   Toe and heel walk were deferred.  Deep tendon reflexes: in the  upper extremities  Pain with antebrachial reflex test-  and lower extremities are symmetric and intact.  Babinski response was deferred .       After spending a total time of  32  minutes face to face and  physical and neurologic examination, review  of laboratory studies,  personal review of imaging studies,  reports and results of other testing and review of referral information / records as far as provided in visit, I have established the following assessments:  1) high risk for OSA by BMI, airway anatomy and neck size. She is a snorer.  2)  Sleep initiation  insomnia unrelated to apnea- inability to fall asleep is usually psychological. 3) inability to maintain sleep can be apnea related, and also can be related to reported neck pain, numbness and pain in right upper extremity. She is treated through workmen's comp.    My Plan is to proceed with:  1) HST or attended sleep study. I prefer attended sleep study, under the help of a sleep aid.  Start TRAZODONE>   2) weight loss will help with many co-morbidities. Higher protein and lower carb diet.   3) her arm, shoulder and hand pain is certainly part of the poor sleep quality.   I would like to thank Sharon Bond, [place] [facility] [place], [address] [place],  [place] [id number] for allowing me to meet with and to take care of this pleasant patient.  I plan to follow up either personally or through our NP within 2-4  month.   Electronically signed by: Sharon Seat, MD 06/28/2020 1:13 PM  [facility] Board certified by The [employer] of Sleep Medicine and Diplomate of the [employer] of Sleep Medicine. Board certified In Neurology through the [place], Fellow of the [employer] of Neurology. Medical Director of [facility].

## 2020-07-05 NOTE — Progress Notes (Signed)
Cardiology Office Note:    Date:  07/17/2020   ID:  Sharon Bond, DOB 1965/12/14, MRN [id number]  PCP:  [name], [place]  Cardiologist:  None  Advanced Practice Provider:  No care team member to display Electrophysiologist:  None    Referring MD: [name], FNP    History of Present Illness:    Sharon Bond is a 55 y.o. female with a hx of HTN who presents to clinic for follow-up of her dyspnea on exertion.  Patient saw [name], FNP on 03/28/20 for cough, DOE, and palpitations. BNP was checked and was normal at 6.5. CBC and BNP normal. She was referred to Cardiology for further evaluation.  Patient was last seen in clinic on 04/06/20 where she was complaining of chest heaviness, dyspnea on exertion, and elevated blood pressures. Coronary CTA 05/11/20 with no obstructive CAD, calcium score 0. TTE 05/04/20 with LVEF 60-65%, mild LVH, normal diastolic function, normal RV, no significant valve disease.   Today, the patient states that she continues to have shortness of breath with exertion, but this is overall improved. Chest heaviness has resolved. Blood pressure has been well controlled at home 120-130s/70s. She has been working on exercising and diet as well. Specifically she has increased her fruits and vegetables and has been adherent to a low sodium diet. Tolerating all medications as prescribed.   Family history: Aunt with CHF (deceased in 85s).  Past Medical History:  Diagnosis Date   Eczema    Hypertension    IUD (intrauterine device) in place 2005   removed 2015   Mixed incontinence    Seasonal allergic rhinitis    Wears glasses     Past Surgical History:  Procedure Laterality Date   DILATATION & CURETTAGE/HYSTEROSCOPY WITH TRUECLEAR N/A 01/08/2013   Procedure: DILATATION & CURETTAGE/HYSTEROSCOPY WITH TRUCLEAR, polypectomy;  Surgeon: Floyce Stakes. Pamala Hurry, MD;  Location: [facility];  Service: Gynecology;  Laterality: N/A;    LAPAROSCOPIC TUBAL LIGATION Bilateral 01/08/2013   Procedure: DIAGNOSTIC LAPAROSCOPY Bilateral Salpingectomy;  Surgeon: Claiborne Billings A. Pamala Hurry, MD;  Location: [facility];  Service: Gynecology;  Laterality: Bilateral;   NO PAST SURGERIES     WISDOM TOOTH EXTRACTION      Current Medications: Current Meds  Medication Sig   albuterol (VENTOLIN HFA) 108 (90 Base) MCG/ACT inhaler Inhale 2 puffs into the lungs every 6 (six) hours as needed for wheezing or shortness of breath.   busPIRone (BUSPAR) 10 MG tablet TAKE 1 TABLET BY MOUTH EVERY DAY   [facility]S MAGNESIUM OXIDE 250 MG TABS TAKE 1 TABLET (250 MG TOTAL) BY MOUTH DAILY. WITH EVENING MEAL   EPINEPHrine 0.3 mg/0.3 mL IJ SOAJ injection Inject 0.3 mg into the muscle as needed for anaphylaxis.   hydrochlorothiazide (HYDRODIURIL) 25 MG tablet TAKE 1 TABLET (25 MG TOTAL) BY MOUTH DAILY.   metoprolol tartrate (LOPRESSOR) 100 MG tablet    potassium chloride (KLOR-CON) 10 MEQ tablet TAKE 1 TABLET BY MOUTH EVERY DAY   traZODone (DESYREL) 50 MG tablet Take 0.5-1 tablets (25-50 mg total) by mouth at bedtime as needed for sleep.   valsartan (DIOVAN) 160 MG tablet Take 1 tablet (160 mg total) by mouth daily.   [DISCONTINUED] nebivolol (BYSTOLIC) 5 MG tablet Take 1 tablet (5 mg total) by mouth daily.     Allergies:   Penicillins, Olmesartan, and Amlodipine   Social History   Socioeconomic History   Marital status: Married    Spouse name: Not on file  Number of children: Not on file   Years of education: Not on file   Highest education level: Not on file  Occupational History   Not on file  Tobacco Use   Smoking status: Never   Smokeless tobacco: Never  Vaping Use   Vaping Use: Never used  Substance and Sexual Activity   Alcohol use: Yes    Comment: occasionally   Drug use: No   Sexual activity: Yes    Partners: Male    Birth control/protection: Surgical  Other Topics Concern   Not on file  Social History Narrative   married, 64 yo son, 63 yo, 29 yo,  exercise - some with walking   Lives at home with spouse and 39 y.o. son   Left handed   Caffeine: decaf coffee on the weekend    Social Determinants of Health   Financial Resource Strain: Not on file  Food Insecurity: Not on file  Transportation Needs: Not on file  Physical Activity: Not on file  Stress: Not on file  Social Connections: Not on file     Family History: The patient's family history includes Diabetes in her brother and mother; Healthy in her father; Heart disease in her maternal aunt; Hypertension in her mother. There is no history of Cancer, Stroke, or Sleep apnea.  ROS:   Please see the history of present illness.    Review of Systems  Constitutional:  Negative for chills, fever and malaise/fatigue.  HENT:  Negative for hearing loss and sore throat.   Eyes:  Negative for blurred vision and redness.  Respiratory:  Positive for shortness of breath.   Cardiovascular:  Positive for palpitations and leg swelling. Negative for chest pain, orthopnea, claudication and PND.  Gastrointestinal:  Negative for blood in stool, nausea and vomiting.  Genitourinary:  Negative for dysuria and flank pain.  Musculoskeletal:  Negative for falls.  Neurological:  Negative for dizziness, loss of consciousness and weakness.  Endo/Heme/Allergies:  Negative for polydipsia.  Psychiatric/Behavioral:  Negative for substance abuse.    EKGs/Labs/Other Studies Reviewed:    The following studies were reviewed today: Coronary CTA 05/11/20: FINDINGS: Non-cardiac: See separate report from [facility].   No LA appendage thrombus. Pulmonary veins drain normally to the left atrium.   Calcium Score: 0 Agatston units.   Coronary Arteries: Right dominant. The LAD and LCx have separate origins from the left cusp.   LM: No LM, separate origins of LAD and LCx from left cusp.   LAD system: Separate origin off left cusp.  No plaque or stenosis.   Circumflex system: Separate origin off  left cusp, early acute angle turn. No plaque or stenosis.   RCA system: No plaque or stenosis.   IMPRESSION: 1. Coronary artery calcium score 0 Agatston units. This suggests low risk for future cardiac events.   2.  No significant coronary disease noted.   3.  Separate origins of LCx and LAD from left cusp.   TTE 05/04/20: IMPRESSIONS     1. Left ventricular ejection fraction, by estimation, is 60 to 65%. The  left ventricle has normal function. The left ventricle has no regional  wall motion abnormalities. There is mild concentric left ventricular  hypertrophy. Left ventricular diastolic  parameters were normal.   2. Right ventricular systolic function is normal. The right ventricular  size is normal.   3. The mitral valve is normal in structure. Trivial mitral valve  regurgitation.   4. The aortic valve is tricuspid. Aortic valve regurgitation  is not  visualized. No aortic stenosis is present.   5. The inferior vena cava is normal in size with greater than 50%  respiratory variability, suggesting right atrial pressure of 3 mmHg.   EKG:  No new tracing today  Recent Labs: 03/28/2020: BNP 6.5 05/09/2020: ALT 13; Hemoglobin 15.2; Platelets 237; TSH 1.470 06/15/2020: BUN 12; Creatinine, Ser 0.84; Potassium 4.1; Sodium 141  Recent Lipid Panel    Component Value Date/Time   CHOL 209 (H) 05/09/2020 1105   TRIG 84 05/09/2020 1105   HDL 61 05/09/2020 1105   CHOLHDL 3.4 05/09/2020 1105   CHOLHDL 3.5 07/18/2014 0001   VLDL 18 07/18/2014 0001   LDLCALC 133 (H) 05/09/2020 1105     Physical Exam:    VS:  BP 138/78   Pulse 70   Ht 5\' 2"  (1.575 m)   Wt 230 lb 12.8 oz (104.7 kg)   LMP 11/28/2012   SpO2 98%   BMI 42.21 kg/m     Wt Readings from Last 3 Encounters:  07/17/20 230 lb 12.8 oz (104.7 kg)  06/28/20 231 lb (104.8 kg)  06/13/20 228 lb (103.4 kg)     GEN:  Comfortable, NAD HEENT: Normal NECK: No JVD; No carotid bruits CARDIAC: RRR, no murmurs RESPIRATORY:   Clear to auscultation without rales, wheezing or rhonchi  ABDOMEN: Soft, non-tender, non-distended MUSCULOSKELETAL:  Warm, no edema SKIN: Warm and dry NEUROLOGIC:  Alert and oriented x 3 PSYCHIATRIC:  Normal affect   ASSESSMENT:    1. Dyspnea on exertion   2. Essential hypertension   3. Precordial pain   4. Leg edema    PLAN:    In order of problems listed above:  #DOE: #Chest Pressure: Patient with worsening chest pressure and shortness of breath on exertion likely due to uncontrolled HTN. Coronary CTA with no obstructive CAD. TTE with normal LVEF, no significant valve disease. Now improved with better control of BP. -Coronary CTA without obstructive disease -TTE with normal LVEF, no significant valve disease -Manage HTN as below  #HTN: Much better controlled and has been followed by HTN clinic.  -Continue HCTZ 25mg  daily -Continue valsartan 160mg  daily -Continue nebivolol 5mg  daily -Did not tolerate amlodipine or lisinopril in the past -Adherent to low Na diet  #LE edema: Improved. Echo with normal LVEF 60-65%, no significant valve disease. -Continue HCTZ 25mg  daily -Leg elevation at the end of the day  Medication Adjustments/Labs and Tests Ordered: Current medicines are reviewed at length with the patient today.  Concerns regarding medicines are outlined above.  No orders of the defined types were placed in this encounter.  Meds ordered this encounter  Medications   nebivolol (BYSTOLIC) 5 MG tablet    Sig: Take 1 tablet (5 mg total) by mouth daily.    Dispense:  90 tablet    Refill:  2    Patient Instructions  Medication Instructions:   Your physician recommends that you continue on your current medications as directed. Please refer to the Current Medication list given to you today.  *If you need a refill on your cardiac medications before your next appointment, please call your pharmacy*   Follow-Up: At [facility], you and your health needs are our  priority.  As part of our continuing mission to provide you with exceptional heart care, we have created designated Provider Care Teams.  These Care Teams include your primary Cardiologist (physician) and Advanced Practice Providers (APPs -  Physician Assistants and Nurse Practitioners) who all work together  to provide you with the care you need, when you need it.  We recommend signing up for the patient portal called "MyChart".  Sign up information is provided on this After Visit Summary.  MyChart is used to connect with patients for Virtual Visits (Telemedicine).  Patients are able to view lab/test results, encounter notes, upcoming appointments, etc.  Non-urgent messages can be sent to your provider as well.   To learn more about what you can do with MyChart, go to [email or web address].    Your next appointment:   8 month(s)  The format for your next appointment:   In Person  Provider:   Gwyndolyn Kaufman, MD or an APP      Signed, Freada Bergeron, MD  07/17/2020 8:41 AM    [place]

## 2020-07-12 ENCOUNTER — Other Ambulatory Visit: Payer: Self-pay | Admitting: Nurse Practitioner

## 2020-07-12 DIAGNOSIS — E876 Hypokalemia: Secondary | ICD-10-CM

## 2020-07-17 ENCOUNTER — Encounter: Payer: Self-pay | Admitting: Cardiology

## 2020-07-17 ENCOUNTER — Other Ambulatory Visit: Payer: Self-pay

## 2020-07-17 ENCOUNTER — Ambulatory Visit: Payer: Medicaid Other | Admitting: Cardiology

## 2020-07-17 VITALS — BP 138/78 | HR 70 | Ht 62.0 in | Wt 230.8 lb

## 2020-07-17 DIAGNOSIS — R072 Precordial pain: Secondary | ICD-10-CM

## 2020-07-17 DIAGNOSIS — R0609 Other forms of dyspnea: Secondary | ICD-10-CM

## 2020-07-17 DIAGNOSIS — R0600 Dyspnea, unspecified: Secondary | ICD-10-CM | POA: Diagnosis not present

## 2020-07-17 DIAGNOSIS — R600 Localized edema: Secondary | ICD-10-CM | POA: Diagnosis not present

## 2020-07-17 DIAGNOSIS — I10 Essential (primary) hypertension: Secondary | ICD-10-CM | POA: Diagnosis not present

## 2020-07-17 MED ORDER — NEBIVOLOL HCL 5 MG PO TABS
5.0000 mg | ORAL_TABLET | Freq: Every day | ORAL | 2 refills | Status: DC
Start: 2020-07-17 — End: 2021-03-06

## 2020-07-17 NOTE — Patient Instructions (Signed)
Medication Instructions:   Your physician recommends that you continue on your current medications as directed. Please refer to the Current Medication list given to you today.  *If you need a refill on your cardiac medications before your next appointment, please call your pharmacy*   Follow-Up: At [facility], you and your health needs are our priority.  As part of our continuing mission to provide you with exceptional heart care, we have created designated Provider Care Teams.  These Care Teams include your primary Cardiologist (physician) and Advanced Practice Providers (APPs -  Physician Assistants and Nurse Practitioners) who all work together to provide you with the care you need, when you need it.  We recommend signing up for the patient portal called "MyChart".  Sign up information is provided on this After Visit Summary.  MyChart is used to connect with patients for Virtual Visits (Telemedicine).  Patients are able to view lab/test results, encounter notes, upcoming appointments, etc.  Non-urgent messages can be sent to your provider as well.   To learn more about what you can do with MyChart, go to [email or web address].    Your next appointment:   8 month(s)  The format for your next appointment:   In Person  Provider:   Gwyndolyn Kaufman, MD or an APP

## 2020-07-18 ENCOUNTER — Ambulatory Visit
Admission: RE | Admit: 2020-07-18 | Discharge: 2020-07-18 | Disposition: A | Discharge status: Home / Self Care | Payer: Medicaid Other | Source: Ambulatory Visit | Attending: Nurse Practitioner | Admitting: Nurse Practitioner

## 2020-07-18 DIAGNOSIS — Z1231 Encounter for screening mammogram for malignant neoplasm of breast: Secondary | ICD-10-CM

## 2020-07-20 ENCOUNTER — Other Ambulatory Visit: Payer: Self-pay | Admitting: Neurology

## 2020-07-26 ENCOUNTER — Encounter: Payer: Self-pay | Admitting: Nurse Practitioner

## 2020-07-26 ENCOUNTER — Ambulatory Visit: Payer: Medicaid Other | Admitting: Nurse Practitioner

## 2020-07-26 ENCOUNTER — Other Ambulatory Visit: Payer: Self-pay

## 2020-07-26 VITALS — BP 124/82 | HR 74 | Temp 98.3°F | Ht 63.2 in | Wt 231.4 lb

## 2020-07-26 DIAGNOSIS — M79601 Pain in right arm: Secondary | ICD-10-CM

## 2020-07-26 NOTE — Progress Notes (Signed)
I,Yamilka Roman [employer] as a Education administrator for [employer], NP.,have documented all relevant documentation on the behalf of [employer], NP,as directed by  Bary Castilla, NP while in the presence of Bary Castilla, NP.  This visit occurred during the SARS-CoV-2 public health emergency.  Safety protocols were in place, including screening questions prior to the visit, additional usage of staff PPE, and extensive cleaning of exam room while observing appropriate contact time as indicated for disinfecting solutions.  Subjective:     Patient ID: Sharon Bond , female    DOB: [birth year] , 55 y.o.   MRN: [id number]   Chief Complaint  Patient presents with   Arm Pain    HPI  She has had a work injury. She has been on workers comp. She works at [employer] and she was lifting some packages and heard a popping sound. She has tried steroid shots, ibuprofen, muscle relaxer's. They have done x-ray, MRI and nerve studies.   Arm Pain  Incident onset: a year ago in May 2021. The incident occurred at work. Injury mechanism: pt stated she was at work lifting boxes and she heard something pop. The pain is present in the upper right arm and right forearm. The quality of the pain is described as aching, cramping, shooting, stabbing and burning. The pain is at a severity of 5/10. The pain is moderate. The pain has been Constant since the incident. Associated symptoms include numbness and tingling (in her fingers). Pertinent negatives include no chest pain. The symptoms are aggravated by lifting. She has tried NSAIDs (also tried muscle relaxers and topical gel) for the symptoms. The treatment provided mild relief.    Past Medical History:  Diagnosis Date   Eczema    Hypertension    IUD (intrauterine device) in place 2005   removed 2015   Mixed incontinence    Seasonal allergic rhinitis    Wears glasses      Family History  Problem Relation Age of Onset   Diabetes Mother    Hypertension  Mother    Healthy Father    Diabetes Brother    Heart disease Maternal Aunt    Cancer Neg Hx    Stroke Neg Hx    Sleep apnea Neg Hx      Current Outpatient Medications:    albuterol (VENTOLIN HFA) 108 (90 Base) MCG/ACT inhaler, Inhale 2 puffs into the lungs every 6 (six) hours as needed for wheezing or shortness of breath., Disp: 8 g, Rfl: 2   busPIRone (BUSPAR) 10 MG tablet, TAKE 1 TABLET BY MOUTH EVERY DAY, Disp: 90 tablet, Rfl: 1   EPINEPHrine 0.3 mg/0.3 mL IJ SOAJ injection, Inject 0.3 mg into the muscle as needed for anaphylaxis., Disp: 1 each, Rfl: 1   hydrochlorothiazide (HYDRODIURIL) 25 MG tablet, TAKE 1 TABLET (25 MG TOTAL) BY MOUTH DAILY., Disp: 90 tablet, Rfl: 0   metoprolol tartrate (LOPRESSOR) 100 MG tablet, Take 100 mg by mouth daily at 6 (six) AM., Disp: , Rfl:    nebivolol (BYSTOLIC) 5 MG tablet, Take 1 tablet (5 mg total) by mouth daily., Disp: 90 tablet, Rfl: 2   potassium chloride (KLOR-CON) 10 MEQ tablet, TAKE 1 TABLET BY MOUTH EVERY DAY, Disp: 30 tablet, Rfl: 1   traZODone (DESYREL) 50 MG tablet, TAKE 0.5-1 TABLETS BY MOUTH AT BEDTIME AS NEEDED FOR SLEEP., Disp: 90 tablet, Rfl: 1   valsartan (DIOVAN) 160 MG tablet, Take 1 tablet (160 mg total) by mouth daily., Disp: 90 tablet, Rfl: 3  [employer] MAGNESIUM OXIDE 250 MG TABS, TAKE 1 TABLET (250 MG TOTAL) BY MOUTH DAILY. WITH EVENING MEAL (Patient not taking: Reported on 07/26/2020), Disp: 90 tablet, Rfl: 1   Allergies  Allergen Reactions   Penicillins Swelling and Other (See Comments)    Facial swelling   Olmesartan     "cough"   Amlodipine Cough     Review of Systems  Constitutional: Negative.  Negative for chills and fever.  Eyes: Negative.   Respiratory:  Negative for cough, shortness of breath and wheezing.   Cardiovascular:  Negative for chest pain and palpitations.  Gastrointestinal: Negative.  Negative for constipation and diarrhea.  Musculoskeletal: Negative.  Negative for back pain and myalgias.  Skin:  Negative.   Neurological:  Positive for tingling (in her fingers) and numbness. Negative for weakness and headaches.  Psychiatric/Behavioral: Negative.      Today's Vitals   07/26/20 1621  BP: 124/82  Pulse: 74  Temp: 98.3 F (36.8 C)  Weight: 231 lb 6.4 oz (105 kg)  Height: 5' 3.2" (1.605 m)  PainSc: 5   PainLoc: Arm   Body mass index is 40.73 kg/m.   Objective:  Physical Exam Constitutional:      Appearance: Normal appearance. She is obese.  HENT:     Head: Normocephalic and atraumatic.  Cardiovascular:     Rate and Rhythm: Normal rate and regular rhythm.     Pulses: Normal pulses.     Heart sounds: Normal heart sounds. No murmur heard. Pulmonary:     Effort: Pulmonary effort is normal. No respiratory distress.     Breath sounds: Normal breath sounds.  Musculoskeletal:        General: Swelling and signs of injury present.     Comments: Work related injury last year.   Skin:    General: Skin is warm and dry.     Capillary Refill: Capillary refill takes less than 2 seconds.  Neurological:     Mental Status: She is alert and oriented to person, place, and time.        Assessment And Plan:     1. Right arm pain  -patient is here with history of right arm pain. She has seen a specialist at [facility]. She has also tried steroid shots, nerve conduction tests, NSAID'S and muscle relaxer's but nothing has helped.  -She would like to see a orthopedic specialist for other treatment options available for her.  - AMB referral to orthopedics  The patient was encouraged to call or send a message through [place] for any questions or concerns.   Follow up: if symptoms persist or do not get better.   Side effects and appropriate use of all the medication(s) were discussed with the patient today. Patient advised to use the medication(s) as directed by their healthcare provider. The patient was encouraged to read, review, and understand all associated package inserts and contact  our office with any questions or concerns. The patient accepts the risks of the treatment plan and had an opportunity to ask questions.   Patient was given opportunity to ask questions. Patient verbalized understanding of the plan and was able to repeat key elements of the plan. All questions were answered to their satisfaction.  Raman Dionna Wiedemann, DNP   I, Raman Betheny Suchecki have reviewed all documentation for this visit. The documentation on 07/26/20 for the exam, diagnosis, procedures, and orders are all accurate and complete.    IF YOU HAVE BEEN REFERRED TO A SPECIALIST, IT MAY TAKE 1-2 WEEKS  TO SCHEDULE/PROCESS THE REFERRAL. IF YOU HAVE NOT HEARD FROM US/SPECIALIST IN TWO WEEKS, PLEASE GIVE [place] A CALL AT [phone].   THE PATIENT IS ENCOURAGED TO PRACTICE SOCIAL DISTANCING DUE TO THE COVID-19 PANDEMIC.

## 2020-07-31 ENCOUNTER — Ambulatory Visit: Payer: Medicaid Other | Admitting: Orthopaedic Surgery

## 2020-08-07 ENCOUNTER — Other Ambulatory Visit: Payer: Self-pay | Admitting: Nurse Practitioner

## 2020-08-07 DIAGNOSIS — E876 Hypokalemia: Secondary | ICD-10-CM

## 2020-08-23 ENCOUNTER — Telehealth: Payer: Self-pay

## 2020-08-23 DIAGNOSIS — Z006 Encounter for examination for normal comparison and control in clinical research program: Secondary | ICD-10-CM

## 2020-08-23 NOTE — Telephone Encounter (Signed)
I have attempted without success to contact this patient by phone for her Identify 90 day follow up phone call. I left a message for patient to return my phone call with my name and callback number. An e-mail was also sent to patient.  

## 2020-08-27 ENCOUNTER — Other Ambulatory Visit: Payer: Self-pay | Admitting: Nurse Practitioner

## 2020-08-27 DIAGNOSIS — I10 Essential (primary) hypertension: Secondary | ICD-10-CM

## 2020-08-30 ENCOUNTER — Encounter: Payer: Self-pay | Admitting: Nurse Practitioner

## 2020-08-30 ENCOUNTER — Ambulatory Visit: Payer: Medicaid Other | Admitting: Nurse Practitioner

## 2020-08-30 ENCOUNTER — Other Ambulatory Visit: Payer: Self-pay

## 2020-08-30 VITALS — BP 132/70 | HR 70 | Temp 98.5°F | Ht 63.2 in | Wt 233.6 lb

## 2020-08-30 DIAGNOSIS — E7800 Pure hypercholesterolemia, unspecified: Secondary | ICD-10-CM

## 2020-08-30 DIAGNOSIS — Z6841 Body Mass Index (BMI) 40.0 and over, adult: Secondary | ICD-10-CM

## 2020-08-30 DIAGNOSIS — I10 Essential (primary) hypertension: Secondary | ICD-10-CM | POA: Diagnosis not present

## 2020-08-30 DIAGNOSIS — R1310 Dysphagia, unspecified: Secondary | ICD-10-CM | POA: Diagnosis not present

## 2020-08-30 MED ORDER — OMEPRAZOLE MAGNESIUM 20 MG PO TBEC: 20.0000 mg | DELAYED_RELEASE_TABLET | Freq: Every day | ORAL | 1 refills | Status: DC

## 2020-08-30 NOTE — Patient Instructions (Signed)

## 2020-08-30 NOTE — Progress Notes (Signed)
I,Sharon Bond,acting as a Education administrator for [employer], FNP.,have documented all relevant documentation on the behalf of Sharon Brine, FNP,as directed by  Sharon Brine, FNP while in the presence of Sharon Bond, [place].  This visit occurred during the SARS-CoV-2 public health emergency.  Safety protocols were in place, including screening questions prior to the visit, additional usage of staff PPE, and extensive cleaning of exam room while observing appropriate contact time as indicated for disinfecting solutions.  Subjective:     Patient ID: Sharon Bond , female    DOB: Jan 20, 1966 , 55 y.o.   MRN: [id number]   Chief Complaint  Patient presents with   Hypertension    HPI  Patient presents today for a blood pressure f/u. She was seen in June by Cardiology and is due to follow up in one year.  She feels that she has some swelling in her throat that is causing difficulty swallowing. She feels like a ball is stuck on the right side of her neck. Symptoms are worse at night. When she eats more coarse foods she has difficulty with swallowing. Began 3 weeks ago  Hypertension This is a chronic problem. The current episode started 1 to 4 weeks ago (last 2 weeks on Thursday felt pressure in her ears. she was in the bed all day.). The problem is uncontrolled. Pertinent negatives include no headaches. There are no associated agents to hypertension. Risk factors for coronary artery disease include obesity, sedentary lifestyle and stress (she is starting back into exercising). Past treatments include beta blockers and angiotensin blockers. Compliance problems: routine follow up.  There is no history of angina or CAD/MI. There is no history of chronic renal disease.    Past Medical History:  Diagnosis Date   Eczema    Hypertension    IUD (intrauterine device) in place 2005   removed 2015   Mixed incontinence    Seasonal allergic rhinitis    Wears glasses      Family History  Problem Relation Age of  Onset   Diabetes Mother    Hypertension Mother    Healthy Father    Diabetes Brother    Heart disease Maternal Aunt    Cancer Neg Hx    Stroke Neg Hx    Sleep apnea Neg Hx      Current Outpatient Medications:    albuterol (VENTOLIN HFA) 108 (90 Base) MCG/ACT inhaler, Inhale 2 puffs into the lungs every 6 (six) hours as needed for wheezing or shortness of breath., Disp: 8 g, Rfl: 2   busPIRone (BUSPAR) 10 MG tablet, TAKE 1 TABLET BY MOUTH EVERY DAY, Disp: 90 tablet, Rfl: 1   EPINEPHrine 0.3 mg/0.3 mL IJ SOAJ injection, Inject 0.3 mg into the muscle as needed for anaphylaxis., Disp: 1 each, Rfl: 1   hydrochlorothiazide (HYDRODIURIL) 25 MG tablet, TAKE 1 TABLET (25 MG TOTAL) BY MOUTH DAILY., Disp: 90 tablet, Rfl: 0   metoprolol tartrate (LOPRESSOR) 100 MG tablet, Take 100 mg by mouth daily at 6 (six) AM., Disp: , Rfl:    nebivolol (BYSTOLIC) 5 MG tablet, Take 1 tablet (5 mg total) by mouth daily., Disp: 90 tablet, Rfl: 2   omeprazole (PRILOSEC OTC) 20 MG tablet, Take 1 tablet (20 mg total) by mouth daily., Disp: 30 tablet, Rfl: 1   traZODone (DESYREL) 50 MG tablet, TAKE 0.5-1 TABLETS BY MOUTH AT BEDTIME AS NEEDED FOR SLEEP., Disp: 90 tablet, Rfl: 1   valsartan (DIOVAN) 160 MG tablet, Take 1 tablet (160 mg total)  by mouth daily., Disp: 90 tablet, Rfl: 3   potassium chloride (KLOR-CON) 10 MEQ tablet, TAKE 1 TABLET BY MOUTH EVERY DAY, Disp: 30 tablet, Rfl: 1   Allergies  Allergen Reactions   Penicillins Swelling and Other (See Comments)    Facial swelling   Olmesartan     "cough"   Amlodipine Cough     Review of Systems  Constitutional: Negative.   HENT:  Positive for trouble swallowing.   Respiratory: Negative.    Cardiovascular: Negative.   Gastrointestinal: Negative.   Neurological: Negative.  Negative for dizziness and headaches.  Psychiatric/Behavioral: Negative.      Today's Vitals   08/30/20 1040  BP: 132/70  Pulse: 70  Temp: 98.5 F (36.9 C)  TempSrc: Oral  Weight:  233 lb 9.6 oz (106 kg)  Height: 5' 3.2" (1.605 m)   Body mass index is 41.12 kg/m.  Wt Readings from Last 3 Encounters:  08/30/20 233 lb 9.6 oz (106 kg)  07/26/20 231 lb 6.4 oz (105 kg)  07/17/20 230 lb 12.8 oz (104.7 kg)    Objective:  Physical Exam Vitals reviewed.  Constitutional:      General: She is not in acute distress.    Appearance: Normal appearance. She is obese.  Cardiovascular:     Rate and Rhythm: Normal rate and regular rhythm.     Pulses: Normal pulses.     Heart sounds: Normal heart sounds. No murmur heard. Pulmonary:     Effort: Pulmonary effort is normal. No respiratory distress.     Breath sounds: Normal breath sounds.  Skin:    Capillary Refill: Capillary refill takes less than 2 seconds.  Neurological:     General: No focal deficit present.     Mental Status: She is alert and oriented to person, place, and time.  Psychiatric:        Mood and Affect: Mood normal.        Behavior: Behavior normal.        Thought Content: Thought content normal.        Judgment: Judgment normal.        Assessment And Plan:     1. Dysphagia, unspecified type Comments: Denies choking episodes, will try omeprazole if no improvement return to office Will treat for possible reflux - omeprazole (PRILOSEC OTC) 20 MG tablet; Take 1 tablet (20 mg total) by mouth daily.  Dispense: 30 tablet; Refill: 1  2. Essential hypertension Comments: Blood pressure is much better controlled. Continue current medications and routine follow up  3. Elevated cholesterol Comments: Stable, no current medications Continue low fat diet - Lipid panel  4. Class 3 severe obesity due to excess calories with serious comorbidity and body mass index (BMI) of 40.0 to 44.9 in adult [facility]) She is encouraged to strive for BMI less than 30 to decrease cardiac risk. Advised to aim for at least 150 minutes of exercise per week.    Patient was given opportunity to ask questions. Patient verbalized  understanding of the plan and was able to repeat key elements of the plan. All questions were answered to their satisfaction.  Sharon Brine, FNP   I, Sharon Brine, FNP, have reviewed all documentation for this visit. The documentation on 08/30/20 for the exam, diagnosis, procedures, and orders are all accurate and complete.   IF YOU HAVE BEEN REFERRED TO A SPECIALIST, IT MAY TAKE 1-2 WEEKS TO SCHEDULE/PROCESS THE REFERRAL. IF YOU HAVE NOT HEARD FROM US/SPECIALIST IN TWO WEEKS, PLEASE GIVE [place] A  CALL AT [phone].   THE PATIENT IS ENCOURAGED TO PRACTICE SOCIAL DISTANCING DUE TO THE COVID-19 PANDEMIC.

## 2020-08-31 ENCOUNTER — Other Ambulatory Visit: Payer: Self-pay | Admitting: Nurse Practitioner

## 2020-08-31 DIAGNOSIS — E876 Hypokalemia: Secondary | ICD-10-CM

## 2020-08-31 LAB — LIPID PANEL
Chol/HDL Ratio: 3.6 ratio (ref 0.0–4.4)
Cholesterol, Total: 173 mg/dL (ref 100–199)
HDL: 48 mg/dL
LDL Chol Calc (NIH): 111 mg/dL — ABNORMAL HIGH (ref 0–99)
Triglycerides: 74 mg/dL (ref 0–149)
VLDL Cholesterol Cal: 14 mg/dL (ref 5–40)

## 2020-09-18 ENCOUNTER — Telehealth: Payer: Self-pay

## 2020-09-18 NOTE — Telephone Encounter (Signed)
LVM for pt to call me back to schedule sleep study  

## 2020-09-19 ENCOUNTER — Other Ambulatory Visit: Payer: Self-pay | Admitting: Nurse Practitioner

## 2020-09-19 ENCOUNTER — Encounter: Payer: Self-pay | Admitting: Nurse Practitioner

## 2020-09-19 DIAGNOSIS — R21 Rash and other nonspecific skin eruption: Secondary | ICD-10-CM

## 2020-09-20 ENCOUNTER — Ambulatory Visit: Payer: Medicaid Other | Admitting: Neurology

## 2020-09-20 DIAGNOSIS — G4701 Insomnia due to medical condition: Secondary | ICD-10-CM

## 2020-09-20 DIAGNOSIS — R0683 Snoring: Secondary | ICD-10-CM

## 2020-09-20 DIAGNOSIS — G4733 Obstructive sleep apnea (adult) (pediatric): Secondary | ICD-10-CM | POA: Diagnosis not present

## 2020-09-20 DIAGNOSIS — G8929 Other chronic pain: Secondary | ICD-10-CM

## 2020-09-20 DIAGNOSIS — F5104 Psychophysiologic insomnia: Secondary | ICD-10-CM

## 2020-09-28 NOTE — Progress Notes (Signed)
[facility] TEST REPORT ( by Watch PAT)   STUDY DATE:  09-23-2020 DOB: [birth year]  MRN: [id number]   ORDERING CLINICIAN: Larey Seat, MD  REFERRING CLINICIAN: [name], NP    CLINICAL INFORMATION/HISTORY: This 55 year-old female was seen on 06/28/2020 ,upon referral by [name], NP.  Chief concern:    trouble falling and staying asleep, daytime fatigue and sleepiness, obesity.    Trouble loosing weight, poor sleep quality, non restorative sleep.  Patient is here for sleep consult for concerns of possible sleep apnea. Patient has daytime fatigue and difficulty falling asleep. Patient reports, if she doesn't take Melatonin she gets about 4 hours of sleep at night. Sleep in high school was very different, she was in bed at 8 PM and up at [place] has a past medical history of MORBID Obesity, Nocturia, Eczema, Hypertension, IUD (intrauterine device) in place (2005), Mixed incontinence, Seasonal allergic rhinitis, Insomnia, and Wears glasses.  Nocturia , morning headaches , vivid dreams, obesity, rocking herself to sleep-      Epworth sleepiness score: 5 /24. FSS at 47/63 points.    BMI: 42.5 kg/m   Neck Circumference: 16"   FINDINGS:   Sleep Summary:   Total Recording Time (hours, min): This home sleep test total recording time was calculated at 8 hours and 17 minutes with a total sleep time of 7 hours and 27 minutes.  Of the sleep time , 26.6% represented REM sleep.                                    Respiratory Indices:   Calculated pAHI (per hour): Overall AHI was 17/h of sleep during REM sleep 27/h of sleep in non-REM sleep 13.4/h of sleep these apnea hypopnea indices indicate REM sleep dependency.  Please notice that the patient had an apnea-hypopnea index for supine sleep at 19.7/h and in nonsupine sleep at 3.3/h. The snoring level was average at 40 dB at mean volume.                                                                      Oxygen Saturation Statistics:   O2 Saturation Range (%): Between a nadir of 83% saturation and a maximum oxygen saturation of 99%.   The mean oxygen saturation was 94% for the night.  Sleep time below 89% oxygen saturation was 2.9 minutes, the equivalent of 0.6% of total sleep time.  Hypoxia time under 90% saturation was 4.6 minutes and still only representing 1% of total sleep time.                                      Pulse Rate Statistics: Please note that this home sleep test device can only give a heart rate but not cardiac rhythm information.         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    REM sleep dependent apnea is usually not receptive to treatment by dental device or inspire device and would require continued positive pressure therapy.  Sleep in supine position should still be avoided.  Weight loss is still recommended.   RECOMMENDATION: Auto titration CPAP device between 6 and 16 cmH2O pressure was 2 cm EPR, heated humidification and mask of patient's choice.  Please consider a mask that does not restrict the patient to supine sleep position.    INTERPRETING PHYSICIAN: Larey Seat, MD   Medical Director of [facility] at [facility].

## 2020-09-30 ENCOUNTER — Other Ambulatory Visit: Payer: Self-pay | Admitting: Nurse Practitioner

## 2020-09-30 DIAGNOSIS — E876 Hypokalemia: Secondary | ICD-10-CM

## 2020-10-03 DIAGNOSIS — G4733 Obstructive sleep apnea (adult) (pediatric): Secondary | ICD-10-CM | POA: Insufficient documentation

## 2020-10-03 NOTE — Procedures (Signed)
[facility] TEST REPORT ( by Watch PAT)   STUDY DATE:  09-23-2020 DOB: [birth year]  MRN: [id number]   ORDERING CLINICIAN: Larey Seat, MD  REFERRING CLINICIAN: [name], NP    CLINICAL INFORMATION/HISTORY: This 55 year-old female was seen on 06/28/2020 ,upon referral by [name], NP.  Chief concern:    trouble falling and staying asleep, daytime fatigue and sleepiness, obesity.    Trouble loosing weight, poor sleep quality, non restorative sleep.  Patient is here for sleep consult for concerns of possible sleep apnea. Patient has daytime fatigue and difficulty falling asleep. Patient reports, if she doesn't take Melatonin she gets about 4 hours of sleep at night. Sleep in high school was very different, she was in bed at 8 PM and up at [place] has a past medical history of MORBID Obesity, Nocturia, Eczema, Hypertension, IUD (intrauterine device) in place (2005), Mixed incontinence, Seasonal allergic rhinitis, Insomnia, and Wears glasses.  Nocturia , morning headaches , vivid dreams, obesity, rocking herself to sleep-      Epworth sleepiness score: 5 /24. FSS at 47/63 points.    BMI: 42.5 kg/m   Neck Circumference: 16"   FINDINGS:   Sleep Summary:   Total Recording Time (hours, min): This home sleep test total recording time was calculated at 8 hours and 17 minutes with a total sleep time of 7 hours and 27 minutes.  Of the sleep time , 26.6% represented REM sleep.                                    Respiratory Indices:   Calculated pAHI (per hour): Overall AHI was 17/h of sleep during REM sleep 27/h of sleep in non-REM sleep 13.4/h of sleep these apnea hypopnea indices indicate REM sleep dependency.  Please notice that the patient had an apnea-hypopnea index for supine sleep at 19.7/h and in nonsupine sleep at 3.3/h. The snoring level was average at 40 dB at mean volume.                                                                      Oxygen Saturation Statistics:   O2 Saturation Range (%): Between a nadir of 83% saturation and a maximum oxygen saturation of 99%.   The mean oxygen saturation was 94% for the night.  Sleep time below 89% oxygen saturation was 2.9 minutes, the equivalent of 0.6% of total sleep time.  Hypoxia time under 90% saturation was 4.6 minutes and still only representing 1% of total sleep time.                                      Pulse Rate Statistics: Please note that this home sleep test device can only give a heart rate but not cardiac rhythm information.         Pulse Range: Between 56 and 82 bpm with a mean heart rate of 72 bpm                IMPRESSION:  This HST confirms the  presence of mild obstructive sleep apnea but strictly REM sleep dependent and supine sleep accentuated.    REM sleep dependent apnea is usually not receptive to treatment by dental device or inspire device and would require continued positive pressure therapy.  Sleep in supine position should still be avoided.  Weight loss is still recommended.   RECOMMENDATION: Auto titration CPAP device between 6 and 16 cmH2O pressure was 2 cm EPR, heated humidification and mask of patient's choice.  Please consider a mask that does not restrict the patient to supine sleep position.    INTERPRETING PHYSICIAN: Larey Seat, MD   Medical Director of [facility] at [facility].

## 2020-10-03 NOTE — Progress Notes (Signed)
IMPRESSION:  This HST confirms the presence of mild obstructive sleep apnea but strictly REM sleep dependent and supine sleep accentuated.    REM sleep dependent apnea is usually not receptive to treatment by dental device or inspire device and would require continued positive pressure therapy.  Sleep in supine position should still be avoided.   Weight loss is still recommended.  RECOMMENDATION: Auto titration CPAP device between 6 and 16 cmH2O pressure was 2 cm EPR, heated humidification and mask of patient's choice.   DME : Please consider a mask that does not restrict the patient to supine sleep position.   INTERPRETING PHYSICIAN: Larey Seat, MD   Medical Director of [facility] Sleep at [facility].

## 2020-10-03 NOTE — Addendum Note (Signed)
Addended by: Larey Seat on: 10/03/2020 06:30 PM   Modules accepted: Orders

## 2020-10-05 ENCOUNTER — Telehealth: Payer: Self-pay

## 2020-10-05 NOTE — Telephone Encounter (Signed)
I called pt. I advised pt that Dr. Brett Fairy  reviewed their sleep study results and found that pt has mild osa. Dr. Brett Fairy recommends that pt start an auto-cpap machine. I reviewed PAP compliance expectations with the pt. Pt is agreeable to starting an auto-PAP. I advised pt that an order will be sent to a DME, [facility], and [facility] will call the pt within about one week after they file with the pt's insurance. [facility] will show the pt how to use the machine, fit for masks, and troubleshoot the auto-PAP if needed. A follow up appt was made for insurance purposes with Dr. Brett Fairy on 12/26/2020 at 330. Pt verbalized understanding to arrive 15 minutes early and bring their auto-PAP. A letter with all of this information in it will be mailed to the pt as a reminder. I verified with the pt that the address we have on file is correct. Pt verbalized understanding of results. Pt had no questions at this time but was encouraged to call back if questions arise. I have sent the order to [facility] and have received confirmation that they have received the order.

## 2020-10-05 NOTE — Telephone Encounter (Signed)
-----   Message from Anda Latina, RN sent at 10/04/2020  7:48 AM EDT -----  ----- Message ----- From: Larey Seat, MD Sent: 10/03/2020   6:30 PM EDT To: [name], FNP, Gna-Pod 1 Results  IMPRESSION:  This HST confirms the presence of mild obstructive sleep apnea but strictly REM sleep dependent and supine sleep accentuated.    REM sleep dependent apnea is usually not receptive to treatment by dental device or inspire device and would require continued positive pressure therapy.  Sleep in supine position should still be avoided.   Weight loss is still recommended.  RECOMMENDATION: Auto titration CPAP device between 6 and 16 cmH2O pressure was 2 cm EPR, heated humidification and mask of patient's choice.   DME : Please consider a mask that does not restrict the patient to supine sleep position.   INTERPRETING PHYSICIAN: Larey Seat, MD   Medical Director of [facility] Sleep at [facility].

## 2020-10-16 ENCOUNTER — Ambulatory Visit: Payer: Medicaid Other | Admitting: Nurse Practitioner

## 2020-10-16 ENCOUNTER — Encounter: Payer: Self-pay | Admitting: Nurse Practitioner

## 2020-10-16 ENCOUNTER — Other Ambulatory Visit: Payer: Self-pay

## 2020-10-16 ENCOUNTER — Encounter: Payer: Self-pay | Admitting: Gastroenterology

## 2020-10-16 VITALS — BP 130/82 | HR 73 | Temp 98.1°F | Ht 63.2 in | Wt 235.2 lb

## 2020-10-16 DIAGNOSIS — Z2821 Immunization not carried out because of patient refusal: Secondary | ICD-10-CM

## 2020-10-16 DIAGNOSIS — R21 Rash and other nonspecific skin eruption: Secondary | ICD-10-CM | POA: Diagnosis not present

## 2020-10-16 DIAGNOSIS — Z23 Encounter for immunization: Secondary | ICD-10-CM

## 2020-10-16 DIAGNOSIS — K219 Gastro-esophageal reflux disease without esophagitis: Secondary | ICD-10-CM | POA: Diagnosis not present

## 2020-10-16 DIAGNOSIS — Z6841 Body Mass Index (BMI) 40.0 and over, adult: Secondary | ICD-10-CM

## 2020-10-16 MED ORDER — TRIAMCINOLONE ACETONIDE 0.1 % EX CREA: 1.0000 "application " | TOPICAL_CREAM | CUTANEOUS | 2 refills | Status: DC | PRN

## 2020-10-16 NOTE — Patient Instructions (Addendum)
You can take an over the counter probiotic (lactobacillus).  Continue with aloe supplementation.   Call [place] for Colonoscopy appt Main Line: [phone]

## 2020-10-16 NOTE — Progress Notes (Signed)
I,Yamilka J Llittleton,acting as a Education administrator for [employer], FNP.,have documented all relevant documentation on the behalf of [name], FNP,as directed by  [name], FNP while in the presence of [name], [place].   This visit occurred during the SARS-CoV-2 public health emergency.  Safety protocols were in place, including screening questions prior to the visit, additional usage of staff PPE, and extensive cleaning of exam room while observing appropriate contact time as indicated for disinfecting solutions.  Subjective:     Patient ID: Sharon Bond , female    DOB: April 09, 1965 , 55 y.o.   MRN: [id number]   Chief Complaint  Patient presents with   dysphagia f/u    HPI  Patient presents today for a 6 week f/u on her dysphagia. She is taking omeprazole 20mg  and it is helping her. She also complains her anxiety is worse but she does not want to increase her medication.  She immediately felt a difference. She did take a break from taking the omeprazole and she   Wt Readings from Last 3 Encounters: 10/16/20 : 235 lb 3.2 oz (106.7 kg) 08/30/20 : 233 lb 9.6 oz (106 kg) 07/26/20 : 231 lb 6.4 oz (105 kg)      Past Medical History:  Diagnosis Date   Eczema    Hypertension    IUD (intrauterine device) in place 2005   removed 2015   Mixed incontinence    Seasonal allergic rhinitis    Wears glasses      Family History  Problem Relation Age of Onset   Diabetes Mother    Hypertension Mother    Healthy Father    Diabetes Brother    Heart disease Maternal Aunt    Cancer Neg Hx    Stroke Neg Hx    Sleep apnea Neg Hx      Current Outpatient Medications:    albuterol (VENTOLIN HFA) 108 (90 Base) MCG/ACT inhaler, Inhale 2 puffs into the lungs every 6 (six) hours as needed for wheezing or shortness of breath., Disp: 8 g, Rfl: 2   busPIRone (BUSPAR) 10 MG tablet, TAKE 1 TABLET BY MOUTH EVERY DAY, Disp: 90 tablet, Rfl: 1   EPINEPHrine 0.3 mg/0.3 mL IJ SOAJ injection, Inject 0.3 mg  into the muscle as needed for anaphylaxis., Disp: 1 each, Rfl: 1   hydrochlorothiazide (HYDRODIURIL) 25 MG tablet, TAKE 1 TABLET (25 MG TOTAL) BY MOUTH DAILY., Disp: 90 tablet, Rfl: 0   metoprolol tartrate (LOPRESSOR) 100 MG tablet, Take 100 mg by mouth daily at 6 (six) AM., Disp: , Rfl:    nebivolol (BYSTOLIC) 5 MG tablet, Take 1 tablet (5 mg total) by mouth daily., Disp: 90 tablet, Rfl: 2   omeprazole (PRILOSEC OTC) 20 MG tablet, Take 1 tablet (20 mg total) by mouth daily., Disp: 30 tablet, Rfl: 1   potassium chloride (KLOR-CON) 10 MEQ tablet, TAKE 1 TABLET BY MOUTH EVERY DAY, Disp: 30 tablet, Rfl: 1   traZODone (DESYREL) 50 MG tablet, TAKE 0.5-1 TABLETS BY MOUTH AT BEDTIME AS NEEDED FOR SLEEP., Disp: 90 tablet, Rfl: 1   valsartan (DIOVAN) 160 MG tablet, Take 1 tablet (160 mg total) by mouth daily., Disp: 90 tablet, Rfl: 3   triamcinolone cream (KENALOG) 0.1 %, Apply 1 application topically as needed. Pt using at least 4 times a day, Disp: 30 g, Rfl: 2   Allergies  Allergen Reactions   Penicillins Swelling and Other (See Comments)    Facial swelling   Olmesartan     "  cough"   Amlodipine Cough     Review of Systems  Constitutional: Negative.   Respiratory: Negative.    Cardiovascular: Negative.   Skin: Negative.   Neurological:  Negative for dizziness and headaches.  Psychiatric/Behavioral: Negative.      Today's Vitals   10/16/20 1127  BP: 130/82  Pulse: 73  Temp: 98.1 F (36.7 C)  Weight: 235 lb 3.2 oz (106.7 kg)  Height: 5' 3.2" (1.605 m)  PainSc: 0-No pain   Body mass index is 41.4 kg/m.   Objective:  Physical Exam Vitals reviewed.  Constitutional:      General: She is not in acute distress.    Appearance: Normal appearance. She is obese.  Cardiovascular:     Rate and Rhythm: Normal rate and regular rhythm.     Pulses: Normal pulses.     Heart sounds: Normal heart sounds. No murmur heard. Pulmonary:     Effort: Pulmonary effort is normal. No respiratory  distress.     Breath sounds: Normal breath sounds. No wheezing.  Skin:    Capillary Refill: Capillary refill takes less than 2 seconds.  Neurological:     General: No focal deficit present.     Mental Status: She is alert and oriented to person, place, and time.  Psychiatric:        Mood and Affect: Mood normal.        Behavior: Behavior normal.        Thought Content: Thought content normal.        Judgment: Judgment normal.        Assessment And Plan:     1. Gastroesophageal reflux disease without esophagitis Comments: Doing well with omeprazole Discussed weight loss to help with Reflux  2. Influenza vaccination declined Patient declined influenza vaccination at this time. Patient is aware that influenza vaccine prevents illness in 70% of healthy people, and reduces hospitalizations to 30-70% in elderly. This vaccine is recommended annually. Pt is willing to accept risk associated with refusing vaccination.  3. Immunization due - Varicella-zoster vaccine IM (Shingrix)  4. Class 3 severe obesity due to excess calories with serious comorbidity and body mass index (BMI) of 40.0 to 44.9 in adult [facility]) Encouraged to increase physical activity Also encouraged to eat a healthy diet low in sugar and starches  5. Rash and nonspecific skin eruption Comments: History of eczema per patient - triamcinolone cream (KENALOG) 0.1 %; Apply 1 application topically as needed. Pt using at least 4 times a day  Dispense: 30 g; Refill: 2   I have given her the number to [facility] to call about her colonoscopy, they called her and left VM.   Patient was given opportunity to ask questions. Patient verbalized understanding of the plan and was able to repeat key elements of the plan. All questions were answered to their satisfaction.  [name], FNP   I, [name], FNP, have reviewed all documentation for this visit. The documentation on 10/16/20 for the exam, diagnosis, procedures, and orders  are all accurate and complete.   IF YOU HAVE BEEN REFERRED TO A SPECIALIST, IT MAY TAKE 1-2 WEEKS TO SCHEDULE/PROCESS THE REFERRAL. IF YOU HAVE NOT HEARD FROM US/SPECIALIST IN TWO WEEKS, PLEASE GIVE [place] A CALL AT [phone].   THE PATIENT IS ENCOURAGED TO PRACTICE SOCIAL DISTANCING DUE TO THE COVID-19 PANDEMIC.

## 2020-10-21 DIAGNOSIS — G4730 Sleep apnea, unspecified: Secondary | ICD-10-CM

## 2020-10-21 HISTORY — DX: Sleep apnea, unspecified: G47.30

## 2020-10-31 ENCOUNTER — Other Ambulatory Visit: Payer: Self-pay | Admitting: Nurse Practitioner

## 2020-10-31 DIAGNOSIS — E876 Hypokalemia: Secondary | ICD-10-CM

## 2020-11-16 ENCOUNTER — Other Ambulatory Visit: Payer: Self-pay

## 2020-11-16 ENCOUNTER — Ambulatory Visit (AMBULATORY_SURGERY_CENTER): Payer: Self-pay

## 2020-11-16 VITALS — Ht 62.0 in | Wt 234.0 lb

## 2020-11-16 DIAGNOSIS — Z1211 Encounter for screening for malignant neoplasm of colon: Secondary | ICD-10-CM

## 2020-11-16 MED ORDER — NA SULFATE-K SULFATE-MG SULF 17.5-3.13-1.6 GM/177ML PO SOLN: 1.0000 | Freq: Once | ORAL | 0 refills | Status: AC

## 2020-11-16 NOTE — Progress Notes (Signed)
No egg or soy allergy known to patient  No issues known to pt with past sedation with any surgeries or procedures Patient denies ever being told they had issues or difficulty with intubation  No FH of Malignant Hyperthermia Pt is not on diet pills Pt is not on  home 02  Pt is not on blood thinners  Pt denies issues with constipation  No A fib or A flutter  Pt is fully vaccinated  for Covid   NO PA's for preps discussed with pt In PV today  Discussed with pt there will be an out-of-pocket cost for prep and that varies from $0 to 70 +  dollars - pt verbalized understanding   Due to the COVID-19 pandemic we are asking patients to follow certain guidelines in PV and the [place]   Pt aware of COVID protocols and [facility] guidelines

## 2020-11-22 ENCOUNTER — Telehealth: Payer: Self-pay | Admitting: Gastroenterology

## 2020-11-22 DIAGNOSIS — Z1211 Encounter for screening for malignant neoplasm of colon: Secondary | ICD-10-CM

## 2020-11-22 MED ORDER — NA SULFATE-K SULFATE-MG SULF 17.5-3.13-1.6 GM/177ML PO SOLN
1.0000 | Freq: Once | ORAL | 0 refills | Status: AC
Start: 2020-11-22 — End: 2020-11-22

## 2020-11-22 NOTE — Telephone Encounter (Signed)
Suprep resent to pharmacy and pt is aware

## 2020-11-22 NOTE — Telephone Encounter (Signed)
Inbound call from pt requesting a call back stating that she never received her prep from the pharmacy. Please advise. Thank you.

## 2020-11-23 ENCOUNTER — Other Ambulatory Visit: Payer: Self-pay | Admitting: Nurse Practitioner

## 2020-11-23 DIAGNOSIS — E876 Hypokalemia: Secondary | ICD-10-CM

## 2020-11-27 ENCOUNTER — Encounter: Payer: Self-pay | Admitting: Gastroenterology

## 2020-11-30 ENCOUNTER — Encounter: Payer: Self-pay | Admitting: Nurse Practitioner

## 2020-12-01 ENCOUNTER — Other Ambulatory Visit: Payer: Self-pay

## 2020-12-01 ENCOUNTER — Ambulatory Visit (AMBULATORY_SURGERY_CENTER): Payer: Medicaid Other | Admitting: Gastroenterology

## 2020-12-01 ENCOUNTER — Encounter: Payer: Self-pay | Admitting: Gastroenterology

## 2020-12-01 VITALS — BP 155/88 | HR 64 | Temp 97.5°F | Resp 12 | Ht 63.0 in | Wt 234.0 lb

## 2020-12-01 DIAGNOSIS — Z8371 Family history of colonic polyps: Secondary | ICD-10-CM | POA: Diagnosis not present

## 2020-12-01 DIAGNOSIS — Z1211 Encounter for screening for malignant neoplasm of colon: Secondary | ICD-10-CM | POA: Diagnosis not present

## 2020-12-01 DIAGNOSIS — D121 Benign neoplasm of appendix: Secondary | ICD-10-CM | POA: Diagnosis not present

## 2020-12-01 DIAGNOSIS — D120 Benign neoplasm of cecum: Secondary | ICD-10-CM

## 2020-12-01 MED ORDER — SODIUM CHLORIDE 0.9 % IV SOLN: 500.0000 mL | Freq: Once | INTRAVENOUS | Status: DC

## 2020-12-01 NOTE — Op Note (Signed)
[place] Patient Name: Sharon Bond Procedure Date: 12/01/2020 9:15 AM MRN: [id number] Endoscopist: Thornton Park MD, MD Age: 55 Referring MD:  Date of Birth: 1965-03-01 Gender: Female Account #: [id number] Procedure:                Colonoscopy Indications:              Screening for colorectal malignant neoplasm, This                            is the patient's first colonoscopy                           Father had colon polyps but they were not thought                            to be precancerous                           No known family history of colon cancer Medicines:                Monitored Anesthesia Care Procedure:                Pre-Anesthesia Assessment:                           - Prior to the procedure, a History and Physical                            was performed, and patient medications and                            allergies were reviewed. The patient's tolerance of                            previous anesthesia was also reviewed. The risks                            and benefits of the procedure and the sedation                            options and risks were discussed with the patient.                            All questions were answered, and informed consent                            was obtained. Prior Anticoagulants: The patient has                            taken no previous anticoagulant or antiplatelet                            agents. ASA Grade Assessment: II - A patient with  mild systemic disease. After reviewing the risks                            and benefits, the patient was deemed in                            satisfactory condition to undergo the procedure.                           After obtaining informed consent, the colonoscope                            was passed under direct vision. Throughout the                            procedure, the patient's blood pressure, pulse, and                             oxygen saturations were monitored continuously. The                            CF [id number] #[id number] was introduced through the anus                            and advanced to the 3 cm into the ileum. A second                            forward view of the right colon was performed. The                            colonoscopy was performed without difficulty. The                            patient tolerated the procedure well. The quality                            of the bowel preparation was good. The terminal                            ileum, ileocecal valve, appendiceal orifice, and                            rectum were photographed. Scope In: 9:28:10 AM Scope Out: 9:39:39 AM Scope Withdrawal Time: 0 hours 9 minutes 29 seconds  Total Procedure Duration: 0 hours 11 minutes 29 seconds  Findings:                 The perianal and digital rectal examinations were                            normal.                           Non-bleeding internal hemorrhoids were found.  Multiple small and large-mouthed diverticula were                            found in the sigmoid colon and descending colon.                           Two sessile polyps were found in the proximal                            ascending colon and ileocecal valve. The polyps                            were 2 mm in size. These polyps were removed with a                            cold snare. Resection and retrieval were complete.                            Estimated blood loss was minimal.                           The exam was otherwise without abnormality on                            direct and retroflexion views. Complications:            No immediate complications. Estimated blood loss:                            Minimal. Estimated Blood Loss:     Estimated blood loss was minimal. Impression:               - Non-bleeding internal hemorrhoids.                           - Diverticulosis in the  sigmoid colon and in the                            descending colon.                           - Two 2 mm polyps in the proximal ascending colon                            and at the ileocecal valve, removed with a cold                            snare. Resected and retrieved.                           - The examination was otherwise normal on direct                            and retroflexion views. Recommendation:           - Patient has  a contact number available for                            emergencies. The signs and symptoms of potential                            delayed complications were discussed with the                            patient. Return to normal activities tomorrow.                            Written discharge instructions were provided to the                            patient.                           - High fiber diet.                           - Continue present medications.                           - Await pathology results.                           - Repeat colonoscopy date to be determined after                            pending pathology results are reviewed for                            surveillance.                           - Emerging evidence supports eating a diet of                            fruits, vegetables, grains, calcium, and yogurt                            while reducing red meat and alcohol may reduce the                            risk of colon cancer.                           - Thank you for allowing me to be involved in your                            colon cancer prevention. Thornton Park MD, MD 12/01/2020 9:44:45 AM This report has been signed electronically.

## 2020-12-01 NOTE — Progress Notes (Signed)
Called to room to assist during endoscopic procedure.  Patient ID and intended procedure confirmed with present staff. Received instructions for my participation in the procedure from the performing physician.  

## 2020-12-01 NOTE — Progress Notes (Signed)
Vss nad transferred to pacu 

## 2020-12-01 NOTE — Progress Notes (Signed)
Referring Provider: [name], FNP Primary Care Physician:  [name], FNP  Reason for Procedure:  Colon cancer screening   IMPRESSION:  Need for colon cancer screening Appropriate candidate for monitored anesthesia care  PLAN: Colonoscopy in the [place] today   HPI: Sharon Bond is a 55 y.o. female presents for screening colonoscopy.  No prior colonoscopy or colon cancer screening.  No baseline GI symptoms except for reflux.   Father with colon polyps that she thinks were benign. No other known family history of colon cancer or polyps. No family history of uterine/endometrial cancer, pancreatic cancer or gastric/stomach cancer.   Past Medical History:  Diagnosis Date   Allergy    pollen   Anxiety 2022   Eczema    GERD (gastroesophageal reflux disease)    Hypertension    IUD (intrauterine device) in place 2005   removed 2015   Mixed incontinence    Seasonal allergic rhinitis    Sleep apnea 10/2020   11/16/20--has not rec'd cpap yet   Wears glasses     Past Surgical History:  Procedure Laterality Date   DILATATION & CURETTAGE/HYSTEROSCOPY WITH TRUECLEAR N/A 01/08/2013   Procedure: DILATATION & CURETTAGE/HYSTEROSCOPY WITH TRUCLEAR, polypectomy;  Surgeon: Claiborne Billings A. Pamala Hurry, MD;  Location: [facility];  Service: Gynecology;  Laterality: N/A;   LAPAROSCOPIC TUBAL LIGATION Bilateral 01/08/2013   Procedure: DIAGNOSTIC LAPAROSCOPY Bilateral Salpingectomy;  Surgeon: Claiborne Billings A. Pamala Hurry, MD;  Location: [facility];  Service: Gynecology;  Laterality: Bilateral;   PARTIAL HYSTERECTOMY  2019   WISDOM TOOTH EXTRACTION      Current Outpatient Medications  Medication Sig Dispense Refill   albuterol (VENTOLIN HFA) 108 (90 Base) MCG/ACT inhaler Inhale 2 puffs into the lungs every 6 (six) hours as needed for wheezing or shortness of breath. 8 g 2   busPIRone (BUSPAR) 10 MG tablet TAKE 1 TABLET BY MOUTH EVERY DAY 90 tablet 1   EPINEPHrine 0.3 mg/0.3 mL IJ SOAJ injection Inject 0.3 mg  into the muscle as needed for anaphylaxis. 1 each 1   hydrochlorothiazide (HYDRODIURIL) 25 MG tablet TAKE 1 TABLET (25 MG TOTAL) BY MOUTH DAILY. 90 tablet 0   Lactobacillus (PROBIOTIC ACIDOPHILUS PO) Take by mouth daily with breakfast.     metoprolol tartrate (LOPRESSOR) 100 MG tablet Take 100 mg by mouth daily at 6 (six) AM.     nebivolol (BYSTOLIC) 5 MG tablet Take 1 tablet (5 mg total) by mouth daily. 90 tablet 2   omeprazole (PRILOSEC OTC) 20 MG tablet Take 1 tablet (20 mg total) by mouth daily. 30 tablet 1   potassium chloride (KLOR-CON) 10 MEQ tablet TAKE 1 TABLET BY MOUTH EVERY DAY 30 tablet 1   traZODone (DESYREL) 50 MG tablet TAKE 0.5-1 TABLETS BY MOUTH AT BEDTIME AS NEEDED FOR SLEEP. 90 tablet 1   triamcinolone cream (KENALOG) 0.1 % Apply 1 application topically as needed. Pt using at least 4 times a day 30 g 2   valsartan (DIOVAN) 160 MG tablet Take 1 tablet (160 mg total) by mouth daily. 90 tablet 3   Current Facility-Administered Medications  Medication Dose Route Frequency Provider Last Rate Last Admin   0.9 %  sodium chloride infusion  500 mL Intravenous Once Thornton Park, MD        Allergies as of 12/01/2020 - Review Complete 12/01/2020  Allergen Reaction Noted   Penicillins Swelling, Other (See Comments), and Rash 09/15/2012   Olmesartan  04/06/2020   Amlodipine Cough 02/13/2017    Family History  Problem  Relation Age of Onset   Diabetes Mother    Hypertension Mother    Healthy Father    Diabetes Brother    Heart disease Maternal Aunt    Cancer Neg Hx    Stroke Neg Hx    Sleep apnea Neg Hx    Colon cancer Neg Hx    Colon polyps Neg Hx    Esophageal cancer Neg Hx    Rectal cancer Neg Hx    Stomach cancer Neg Hx      Physical Exam: General:   Alert,  well-nourished, pleasant and cooperative in NAD Head:  Normocephalic and atraumatic. Eyes:  Sclera clear, no icterus.   Conjunctiva pink. Mouth:  No deformity or lesions.   Neck:  Supple; no masses or  thyromegaly. Lungs:  Clear throughout to auscultation.   No wheezes. Heart:  Regular rate and rhythm; no murmurs. Abdomen:  Soft, non-tender, nondistended, normal bowel sounds, no rebound or guarding.  Msk:  Symmetrical. No boney deformities LAD: No inguinal or umbilical LAD Extremities:  No clubbing or edema. Neurologic:  Alert and  oriented x4;  grossly nonfocal Skin:  No obvious rash or bruise. Psych:  Alert and cooperative. Normal mood and affect.     Aswad Wandrey L. Tarri Glenn, MD, MPH 12/01/2020, 8:43 AM

## 2020-12-01 NOTE — Patient Instructions (Signed)
Handouts provided on polyps, diverticulosis, hemorrhoids and high-fiber diet.   YOU HAD AN ENDOSCOPIC PROCEDURE TODAY AT THE [facility]:   Refer to the procedure report that was given to you for any specific questions about what was found during the examination.  If the procedure report does not answer your questions, please call your gastroenterologist to clarify.  If you requested that your care partner not be given the details of your procedure findings, then the procedure report has been included in a sealed envelope for you to review at your convenience later.  YOU SHOULD EXPECT: Some feelings of bloating in the abdomen. Passage of more gas than usual.  Walking can help get rid of the air that was put into your GI tract during the procedure and reduce the bloating. If you had a lower endoscopy (such as a colonoscopy or flexible sigmoidoscopy) you may notice spotting of blood in your stool or on the toilet paper. If you underwent a bowel prep for your procedure, you may not have a normal bowel movement for a few days.  Please Note:  You might notice some irritation and congestion in your nose or some drainage.  This is from the oxygen used during your procedure.  There is no need for concern and it should clear up in a day or so.  SYMPTOMS TO REPORT IMMEDIATELY:   Following lower endoscopy (colonoscopy or flexible sigmoidoscopy):  Excessive amounts of blood in the stool  Significant tenderness or worsening of abdominal pains  Swelling of the abdomen that is new, acute  Fever of 100F or higher  For urgent or emergent issues, a gastroenterologist can be reached at any hour by calling [phone]. Do not use MyChart messaging for urgent concerns.    DIET:  We do recommend a small meal at first, but then you may proceed to your regular diet.  Drink plenty of fluids but you should avoid alcoholic beverages for 24 hours.  ACTIVITY:  You should plan to take it easy for the rest  of today and you should NOT DRIVE or use heavy machinery until tomorrow (because of the sedation medicines used during the test).    FOLLOW UP: Our staff will call the number listed on your records 48-72 hours following your procedure to check on you and address any questions or concerns that you may have regarding the information given to you following your procedure. If we do not reach you, we will leave a message.  We will attempt to reach you two times.  During this call, we will ask if you have developed any symptoms of COVID 19. If you develop any symptoms (ie: fever, flu-like symptoms, shortness of breath, cough etc.) before then, please call [phone].  If you test positive for Covid 19 in the 2 weeks post procedure, please call and report this information to us.    If any biopsies were taken you will be contacted by phone or by letter within the next 1-3 weeks.  Please call us at [phone] if you have not heard about the biopsies in 3 weeks.    SIGNATURES/CONFIDENTIALITY: You and/or your care partner have signed paperwork which will be entered into your electronic medical record.  These signatures attest to the fact that that the information above on your After Visit Summary has been reviewed and is understood.  Full responsibility of the confidentiality of this discharge information lies with you and/or your care-partner.  

## 2020-12-01 NOTE — Progress Notes (Signed)
Pt's states no medical or surgical changes since previsit or office visit. VS by DT. °

## 2020-12-05 ENCOUNTER — Telehealth: Payer: Self-pay

## 2020-12-05 ENCOUNTER — Telehealth: Payer: Self-pay | Admitting: *Deleted

## 2020-12-05 NOTE — Telephone Encounter (Signed)
Left VM no answer

## 2020-12-05 NOTE — Telephone Encounter (Signed)
Attempted to reach pt. With follow-up call following endoscopic procedure 12/01/2020.  LM on pt. Voice mail to call if she has amy questions or concerns.

## 2020-12-06 ENCOUNTER — Other Ambulatory Visit: Payer: Self-pay | Admitting: Nurse Practitioner

## 2020-12-06 DIAGNOSIS — I10 Essential (primary) hypertension: Secondary | ICD-10-CM

## 2020-12-06 DIAGNOSIS — F419 Anxiety disorder, unspecified: Secondary | ICD-10-CM

## 2020-12-10 ENCOUNTER — Encounter: Payer: Self-pay | Admitting: Gastroenterology

## 2020-12-17 ENCOUNTER — Other Ambulatory Visit: Payer: Self-pay | Admitting: Nurse Practitioner

## 2020-12-17 DIAGNOSIS — E876 Hypokalemia: Secondary | ICD-10-CM

## 2020-12-19 ENCOUNTER — Ambulatory Visit (INDEPENDENT_AMBULATORY_CARE_PROVIDER_SITE_OTHER): Payer: Medicaid Other

## 2020-12-19 ENCOUNTER — Other Ambulatory Visit: Payer: Self-pay

## 2020-12-19 ENCOUNTER — Ambulatory Visit: Payer: Medicaid Other

## 2020-12-19 VITALS — BP 128/82 | HR 78 | Temp 98.1°F | Ht 63.0 in | Wt 231.0 lb

## 2020-12-19 DIAGNOSIS — Z23 Encounter for immunization: Secondary | ICD-10-CM | POA: Diagnosis not present

## 2020-12-19 NOTE — Progress Notes (Signed)
Pt here today for pneumonia immunization.

## 2020-12-26 ENCOUNTER — Ambulatory Visit: Payer: Medicaid Other | Admitting: Neurology

## 2021-01-01 ENCOUNTER — Ambulatory Visit: Payer: Medicaid Other | Admitting: Nurse Practitioner

## 2021-01-08 ENCOUNTER — Ambulatory Visit: Payer: Medicaid Other | Admitting: Nurse Practitioner

## 2021-01-16 ENCOUNTER — Other Ambulatory Visit: Payer: Self-pay | Admitting: Nurse Practitioner

## 2021-01-16 DIAGNOSIS — E876 Hypokalemia: Secondary | ICD-10-CM

## 2021-01-29 ENCOUNTER — Other Ambulatory Visit: Payer: Self-pay | Admitting: Nurse Practitioner

## 2021-01-29 DIAGNOSIS — E876 Hypokalemia: Secondary | ICD-10-CM

## 2021-02-05 ENCOUNTER — Other Ambulatory Visit: Payer: Self-pay

## 2021-02-05 ENCOUNTER — Ambulatory Visit (HOSPITAL_COMMUNITY)
Admission: EM | Admit: 2021-02-05 | Discharge: 2021-02-05 | Disposition: A | Discharge status: Home / Self Care | Payer: Medicaid Other | Attending: Internal Medicine | Admitting: Internal Medicine

## 2021-02-05 ENCOUNTER — Encounter (HOSPITAL_COMMUNITY): Payer: Self-pay | Admitting: Emergency Medicine

## 2021-02-05 DIAGNOSIS — M5432 Sciatica, left side: Secondary | ICD-10-CM | POA: Diagnosis not present

## 2021-02-05 MED ORDER — METHOCARBAMOL 500 MG PO TABS: 500.0000 mg | ORAL_TABLET | Freq: Every evening | ORAL | 0 refills | Status: DC | PRN

## 2021-02-05 MED ORDER — KETOROLAC TROMETHAMINE 30 MG/ML IJ SOLN
30.0000 mg | Freq: Once | INTRAMUSCULAR | Status: AC
  Administered 2021-02-05: 30 mg via INTRAMUSCULAR

## 2021-02-05 MED ORDER — KETOROLAC TROMETHAMINE 30 MG/ML IJ SOLN
INTRAMUSCULAR | Status: AC
  Filled 2021-02-05: qty 1

## 2021-02-05 MED ORDER — PREDNISONE 20 MG PO TABS: 40.0000 mg | ORAL_TABLET | Freq: Every day | ORAL | 0 refills | Status: AC

## 2021-02-05 NOTE — ED Provider Notes (Signed)
[facility]    CSN: [id number] Arrival date & time: 02/05/21  1287      History   Chief Complaint Chief Complaint  Patient presents with   Back Pain    HPI Sharon Bond is a 56 y.o. female comes to urgent care with 5-day history of left-sided lower back pain.  Pain is throbbing and at times sharp, currently 8 out of 10, aggravated by movement with no known relieving factors.  Patient has tried heating pad, Tylenol arthritis and ibuprofen with no improvement in symptoms.  Pain radiating to the left leg.  She endorses pins-and-needles in the left leg.  No weakness in the left leg.  No falls or trauma to the back.  This is the first episode of back pain.  Patient denies any dysuria urgency or frequency.  No flank pain. HPI  Past Medical History:  Diagnosis Date   Allergy    pollen   Anxiety 2022   Eczema    GERD (gastroesophageal reflux disease)    Hypertension    IUD (intrauterine device) in place 2005   removed 2015   Mixed incontinence    Seasonal allergic rhinitis    Sleep apnea 10/2020   11/16/20--has not rec'd cpap yet   Wears glasses     Patient Active Problem List   Diagnosis Date Noted   Obstructive sleep apnea hypopnea, moderate 10/03/2020   Insomnia secondary to chronic pain 06/28/2020   Psychophysiological insomnia 06/28/2020   Class 3 severe obesity due to excess calories without serious comorbidity with body mass index (BMI) of 40.0 to 44.9 in adult ([place]) 06/28/2020   Snoring 06/28/2020   Pain in right arm 05/31/2019   Dysmenorrhea 08/12/2017   Dyspareunia in female 08/12/2017   Fibroid uterus 08/12/2017   Menorrhagia with regular cycle 08/12/2017   Intrinsic eczema 02/27/2017   Obesity, Class III, BMI 40-49.9 (morbid obesity) ([place]) 02/13/2017   Elevated blood pressure 10/07/2012   Hypokalemia 03/20/2006   ANXIETY 03/20/2006   Essential hypertension 03/20/2006   Rhinitis, allergic 03/20/2006   GASTROESOPHAGEAL REFLUX, NO ESOPHAGITIS  03/20/2006   FIBROMYALGIA, FIBROMYOSITIS 03/20/2006   MUSCLE CRAMPS NOS 03/20/2006    Past Surgical History:  Procedure Laterality Date   DILATATION & CURETTAGE/HYSTEROSCOPY WITH TRUECLEAR N/A 01/08/2013   Procedure: DILATATION & CURETTAGE/HYSTEROSCOPY WITH TRUCLEAR, polypectomy;  Surgeon: Claiborne Billings A. Pamala Hurry, MD;  Location: [facility];  Service: Gynecology;  Laterality: N/A;   LAPAROSCOPIC TUBAL LIGATION Bilateral 01/08/2013   Procedure: DIAGNOSTIC LAPAROSCOPY Bilateral Salpingectomy;  Surgeon: Claiborne Billings A. Pamala Hurry, MD;  Location: [facility];  Service: Gynecology;  Laterality: Bilateral;   PARTIAL HYSTERECTOMY  2019   WISDOM TOOTH EXTRACTION      OB History     Gravida  4   Para  4   Term  3   Preterm  1   AB      Living  2      SAB      IAB      Ectopic      Multiple      Live Births  2            Home Medications    Prior to Admission medications   Medication Sig Start Date End Date Taking? Authorizing Provider  methocarbamol (ROBAXIN) 500 MG tablet Take 1 tablet (500 mg total) by mouth at bedtime as needed for muscle spasms. 02/05/21  Yes Bastien Strawser, Myrene Galas, MD  predniSONE (DELTASONE) 20 MG tablet Take 2 tablets (40 mg total)  by mouth daily for 5 days. 02/05/21 02/10/21 Yes Temekia Caskey, Myrene Galas, MD  albuterol (VENTOLIN HFA) 108 (90 Base) MCG/ACT inhaler Inhale 2 puffs into the lungs every 6 (six) hours as needed for wheezing or shortness of breath. 03/23/20   [name], FNP  busPIRone (BUSPAR) 10 MG tablet TAKE 1 TABLET BY MOUTH EVERY DAY 12/06/20   [name], FNP  EPINEPHrine 0.3 mg/0.3 mL IJ SOAJ injection Inject 0.3 mg into the muscle as needed for anaphylaxis. 03/23/20   [name], FNP  hydrochlorothiazide (HYDRODIURIL) 25 MG tablet TAKE 1 TABLET (25 MG TOTAL) BY MOUTH DAILY. 12/06/20   [name], FNP  Lactobacillus (PROBIOTIC ACIDOPHILUS PO) Take by mouth daily with breakfast.    [provider]  metoprolol tartrate (LOPRESSOR) 100 MG tablet Take  100 mg by mouth daily at 6 (six) AM. 04/06/20   [provider]  nebivolol (BYSTOLIC) 5 MG tablet Take 1 tablet (5 mg total) by mouth daily. 07/17/20   Freada Bergeron, MD  omeprazole (PRILOSEC OTC) 20 MG tablet Take 1 tablet (20 mg total) by mouth daily. 08/30/20   [name], FNP  potassium chloride (KLOR-CON) 10 MEQ tablet TAKE 1 TABLET BY MOUTH EVERY DAY 01/30/21   [name], FNP  traZODone (DESYREL) 50 MG tablet TAKE 0.5-1 TABLETS BY MOUTH AT BEDTIME AS NEEDED FOR SLEEP. 07/20/20   Dohmeier, Asencion Partridge, MD  triamcinolone cream (KENALOG) 0.1 % Apply 1 application topically as needed. Pt using at least 4 times a day 10/16/20   [name], FNP  valsartan (DIOVAN) 160 MG tablet Take 1 tablet (160 mg total) by mouth daily. 05/23/20   Freada Bergeron, MD    Family History Family History  Problem Relation Age of Onset   Diabetes Mother    Hypertension Mother    Healthy Father    Diabetes Brother    Heart disease Maternal Aunt    Cancer Neg Hx    Stroke Neg Hx    Sleep apnea Neg Hx    Colon cancer Neg Hx    Colon polyps Neg Hx    Esophageal cancer Neg Hx    Rectal cancer Neg Hx    Stomach cancer Neg Hx     Social History Social History   Tobacco Use   Smoking status: Never   Smokeless tobacco: Never  Vaping Use   Vaping Use: Never used  Substance Use Topics   Alcohol use: Yes    Comment: occasionally during holidays, wine or beer   Drug use: Never     Allergies   Penicillins, Olmesartan, and Amlodipine   Review of Systems Review of Systems  Cardiovascular: Negative.   Gastrointestinal: Negative.   Musculoskeletal:  Positive for back pain. Negative for gait problem, joint swelling, myalgias, neck pain and neck stiffness.  Skin: Negative.     Physical Exam Triage Vital Signs ED Triage Vitals  Enc Vitals Group     BP 02/05/21 0829 (!) 155/83     Pulse Rate 02/05/21 0829 73     Resp 02/05/21 0829 20     Temp 02/05/21 0829 98.9 F (37.2 C)      Temp Source 02/05/21 0829 Oral     SpO2 02/05/21 0829 99 %     Weight --      Height --      Head Circumference --      Peak Flow --      Pain Score 02/05/21 0827 8     Pain Loc --  Pain Edu? --      Excl. in [place]? --    No data found.  Updated Vital Signs BP (!) 155/83 (BP Location: Right Arm) Comment: did not take medicine this morning Comment (BP Location): large cuff   Pulse 73    Temp 98.9 F (37.2 C) (Oral)    Resp 20    LMP 11/28/2012    SpO2 99%   Visual Acuity Right Eye Distance:   Left Eye Distance:   Bilateral Distance:    Right Eye Near:   Left Eye Near:    Bilateral Near:     Physical Exam Vitals and nursing note reviewed.  Constitutional:      General: She is in acute distress.     Appearance: She is not ill-appearing.  Cardiovascular:     Rate and Rhythm: Normal rate and regular rhythm.  Abdominal:     General: Bowel sounds are normal.     Palpations: Abdomen is soft.  Musculoskeletal:     Comments: Tenderness on palpation over the sacroiliac joints in the lower back.  Increased muscle tone of the lumbar paraspinal muscle.  No bruising noted  Neurological:     General: No focal deficit present.     Mental Status: She is alert and oriented to person, place, and time.     Coordination: Coordination normal.     Deep Tendon Reflexes: Reflexes normal.     UC Treatments / Results  Labs (all labs ordered are listed, but only abnormal results are displayed) Labs Reviewed - No data to display  EKG   Radiology No results found.  Procedures Procedures (including critical care time)  Medications Ordered in UC Medications  ketorolac (TORADOL) 30 MG/ML injection 30 mg (30 mg Intramuscular Given 02/05/21 0909)    Initial Impression / Assessment and Plan / UC Course  I have reviewed the triage vital signs and the nursing notes.  Pertinent labs & imaging results that were available during my care of the patient were reviewed by me and considered in  my medical decision making (see chart for details).     1.  Left-sided sciatica: Toradol 30 mg IM x1 dose Gentle range of motion exercises Heating pad recommended Short course of steroids Muscle relaxants with precautions were given. No indication for imaging at this time Return precautions given Final Clinical Impressions(s) / UC Diagnoses   Final diagnoses:  Left sided sciatica     Discharge Instructions      Please take medications as prescribed Gentle range of motion exercises Heating pad use on a 20-minute on-20 minutes off cycle Do not take ibuprofen while taking steroids Return to urgent care if symptoms worsen.   ED Prescriptions     Medication Sig Dispense Auth. Provider   methocarbamol (ROBAXIN) 500 MG tablet Take 1 tablet (500 mg total) by mouth at bedtime as needed for muscle spasms. 10 tablet Azar South, Myrene Galas, MD   predniSONE (DELTASONE) 20 MG tablet Take 2 tablets (40 mg total) by mouth daily for 5 days. 10 tablet Oyuki Hogan, Myrene Galas, MD      PDMP not reviewed this encounter.   Chase Picket, MD 02/05/21 1116

## 2021-02-05 NOTE — Discharge Instructions (Addendum)
Please take medications as prescribed Gentle range of motion exercises Heating pad use on a 20-minute on-20 minutes off cycle Do not take ibuprofen while taking steroids Return to urgent care if symptoms worsen.

## 2021-02-05 NOTE — ED Triage Notes (Signed)
Patient complains left lower back, hip pain.  Reports leg is swollen and "feels like pins are prickling it".  Onset last week, 01/26/2021.  No recent injury.  Reports fall in 2019.

## 2021-02-12 ENCOUNTER — Encounter: Payer: Self-pay | Admitting: Cardiology

## 2021-02-12 DIAGNOSIS — Z79899 Other long term (current) drug therapy: Secondary | ICD-10-CM

## 2021-02-12 DIAGNOSIS — I10 Essential (primary) hypertension: Secondary | ICD-10-CM

## 2021-02-13 NOTE — Addendum Note (Signed)
Addended by: Nuala Alpha on: 02/13/2021 08:25 AM   Modules accepted: Orders

## 2021-02-13 NOTE — Telephone Encounter (Signed)
RE: pt needs 2 appts per Johney Frame Received: Today Jerlyn Ly, LPN 2/9  with pharm/ pt is aware of both appointments

## 2021-02-13 NOTE — Telephone Encounter (Signed)
Freada Bergeron, MD  [name] 17 hours ago (2:44 PM)   HP You can add her on to my short day on 2/14. In the interim, can we get her into HTN clinic? That is a great idea!      Referral to HTN clinic placed in the system as indicated above per Dr. Johney Frame.  Will send Dr. Jacolyn Reedy scheduler a message to call the pt and make her an appt with our BP clinic now and add her to Dr. Jacolyn Reedy schedule for 2/14 at her 10 am end slot.

## 2021-02-26 ENCOUNTER — Ambulatory Visit (INDEPENDENT_AMBULATORY_CARE_PROVIDER_SITE_OTHER): Payer: Medicaid Other

## 2021-02-26 ENCOUNTER — Other Ambulatory Visit: Payer: Self-pay

## 2021-02-26 ENCOUNTER — Ambulatory Visit (HOSPITAL_COMMUNITY)
Admission: EM | Admit: 2021-02-26 | Discharge: 2021-02-26 | Disposition: A | Discharge status: Home / Self Care | Payer: Medicaid Other | Attending: Physician Assistant | Admitting: Physician Assistant

## 2021-02-26 ENCOUNTER — Encounter (HOSPITAL_COMMUNITY): Payer: Self-pay

## 2021-02-26 DIAGNOSIS — M79671 Pain in right foot: Secondary | ICD-10-CM

## 2021-02-26 DIAGNOSIS — S99921A Unspecified injury of right foot, initial encounter: Secondary | ICD-10-CM

## 2021-02-26 MED ORDER — NAPROXEN 500 MG PO TABS: 500.0000 mg | ORAL_TABLET | Freq: Two times a day (BID) | ORAL | 0 refills | Status: DC

## 2021-02-26 NOTE — Discharge Instructions (Signed)
Keep your foot elevated and use ice for additional symptom relief.  Take Naprosyn twice daily for pain.  Do not take NSAIDs including aspirin, ibuprofen/Advil, naproxen/Aleve with this medication as it caused medically.  You can take Tylenol.  If your symptoms are not improving within a few days please return or see orthopedics for further evaluation and management.  If you develop any severe symptoms including severe pain, swelling, coolness of your toes, numbness, tingling sensation you should be seen immediately.

## 2021-02-26 NOTE — ED Triage Notes (Signed)
Pt presents for right toe injury. Pt reports a case of water fall on her foot.

## 2021-02-26 NOTE — ED Provider Notes (Signed)
[facility]    CSN: [id number] Arrival date & time: 02/26/21  1723      History   Chief Complaint Chief Complaint  Patient presents with   Toe Injury    HPI Sharon Bond is a 56 y.o. female.   Patient presents today with right great toe pain and pain throughout her right foot following injury.  Reports that a case of water fell out of the truck and landed on her foot causing injury.  She has been unable to bear weight since that time.  Pain is rated 8 on a 0-10 pain scale, localized to right big toe with radiation into dorsal foot, described as throbbing, worse with palpation or attempted ambulation, no alleviating factors identified.  She denies previous injury or surgery involving foot or ankle.  She does report some swelling and numbness.  She has not tried any over-the-counter medication.   Past Medical History:  Diagnosis Date   Allergy    pollen   Anxiety 2022   Eczema    GERD (gastroesophageal reflux disease)    Hypertension    IUD (intrauterine device) in place 2005   removed 2015   Mixed incontinence    Seasonal allergic rhinitis    Sleep apnea 10/2020   11/16/20--has not rec'd cpap yet   Wears glasses     Patient Active Problem List   Diagnosis Date Noted   Obstructive sleep apnea hypopnea, moderate 10/03/2020   Insomnia secondary to chronic pain 06/28/2020   Psychophysiological insomnia 06/28/2020   Class 3 severe obesity due to excess calories without serious comorbidity with body mass index (BMI) of 40.0 to 44.9 in adult ([place]) 06/28/2020   Snoring 06/28/2020   Pain in right arm 05/31/2019   Dysmenorrhea 08/12/2017   Dyspareunia in female 08/12/2017   Fibroid uterus 08/12/2017   Menorrhagia with regular cycle 08/12/2017   Intrinsic eczema 02/27/2017   Obesity, Class III, BMI 40-49.9 (morbid obesity) ([place]) 02/13/2017   Elevated blood pressure 10/07/2012   Hypokalemia 03/20/2006   ANXIETY 03/20/2006   Essential hypertension 03/20/2006    Rhinitis, allergic 03/20/2006   GASTROESOPHAGEAL REFLUX, NO ESOPHAGITIS 03/20/2006   FIBROMYALGIA, FIBROMYOSITIS 03/20/2006   MUSCLE CRAMPS NOS 03/20/2006    Past Surgical History:  Procedure Laterality Date   DILATATION & CURETTAGE/HYSTEROSCOPY WITH TRUECLEAR N/A 01/08/2013   Procedure: DILATATION & CURETTAGE/HYSTEROSCOPY WITH TRUCLEAR, polypectomy;  Surgeon: Claiborne Billings A. Pamala Hurry, MD;  Location: [facility];  Service: Gynecology;  Laterality: N/A;   LAPAROSCOPIC TUBAL LIGATION Bilateral 01/08/2013   Procedure: DIAGNOSTIC LAPAROSCOPY Bilateral Salpingectomy;  Surgeon: Claiborne Billings A. Pamala Hurry, MD;  Location: [facility];  Service: Gynecology;  Laterality: Bilateral;   PARTIAL HYSTERECTOMY  2019   WISDOM TOOTH EXTRACTION      OB History     Gravida  4   Para  4   Term  3   Preterm  1   AB      Living  2      SAB      IAB      Ectopic      Multiple      Live Births  2            Home Medications    Prior to Admission medications   Medication Sig Start Date End Date Taking? Authorizing Provider  naproxen (NAPROSYN) 500 MG tablet Take 1 tablet (500 mg total) by mouth 2 (two) times daily. 02/26/21  Yes Delma Drone K, PA-C  albuterol (VENTOLIN HFA) 108 (90  Base) MCG/ACT inhaler Inhale 2 puffs into the lungs every 6 (six) hours as needed for wheezing or shortness of breath. 03/23/20   [name], FNP  busPIRone (BUSPAR) 10 MG tablet TAKE 1 TABLET BY MOUTH EVERY DAY 12/06/20   [name], FNP  EPINEPHrine 0.3 mg/0.3 mL IJ SOAJ injection Inject 0.3 mg into the muscle as needed for anaphylaxis. 03/23/20   [name], FNP  hydrochlorothiazide (HYDRODIURIL) 25 MG tablet TAKE 1 TABLET (25 MG TOTAL) BY MOUTH DAILY. 12/06/20   [name], FNP  Lactobacillus (PROBIOTIC ACIDOPHILUS PO) Take by mouth daily with breakfast.    [provider]  methocarbamol (ROBAXIN) 500 MG tablet Take 1 tablet (500 mg total) by mouth at bedtime as needed for muscle spasms. 02/05/21   Lamptey, Myrene Galas, MD  metoprolol tartrate (LOPRESSOR) 100 MG tablet Take 100 mg by mouth daily at 6 (six) AM. 04/06/20   [provider]  nebivolol (BYSTOLIC) 5 MG tablet Take 1 tablet (5 mg total) by mouth daily. 07/17/20   Freada Bergeron, MD  omeprazole (PRILOSEC OTC) 20 MG tablet Take 1 tablet (20 mg total) by mouth daily. 08/30/20   [name], FNP  potassium chloride (KLOR-CON) 10 MEQ tablet TAKE 1 TABLET BY MOUTH EVERY DAY 01/30/21   [name], FNP  traZODone (DESYREL) 50 MG tablet TAKE 0.5-1 TABLETS BY MOUTH AT BEDTIME AS NEEDED FOR SLEEP. 07/20/20   Dohmeier, Asencion Partridge, MD  triamcinolone cream (KENALOG) 0.1 % Apply 1 application topically as needed. Pt using at least 4 times a day 10/16/20   [name], FNP  valsartan (DIOVAN) 160 MG tablet Take 1 tablet (160 mg total) by mouth daily. 05/23/20   Freada Bergeron, MD    Family History Family History  Problem Relation Age of Onset   Diabetes Mother    Hypertension Mother    Healthy Father    Diabetes Brother    Heart disease Maternal Aunt    Cancer Neg Hx    Stroke Neg Hx    Sleep apnea Neg Hx    Colon cancer Neg Hx    Colon polyps Neg Hx    Esophageal cancer Neg Hx    Rectal cancer Neg Hx    Stomach cancer Neg Hx     Social History Social History   Tobacco Use   Smoking status: Never   Smokeless tobacco: Never  Vaping Use   Vaping Use: Never used  Substance Use Topics   Alcohol use: Yes    Comment: occasionally during holidays, wine or beer   Drug use: Never     Allergies   Penicillins, Olmesartan, and Amlodipine   Review of Systems Review of Systems  Constitutional:  Positive for activity change. Negative for appetite change, fatigue and fever.  Respiratory:  Negative for cough and shortness of breath.   Cardiovascular:  Negative for chest pain.  Gastrointestinal:  Negative for abdominal pain, diarrhea, nausea and vomiting.  Musculoskeletal:  Positive for arthralgias, gait problem and joint swelling.  Negative for myalgias.  Neurological:  Negative for dizziness, light-headedness and headaches.    Physical Exam Triage Vital Signs ED Triage Vitals  Enc Vitals Group     BP 02/26/21 1923 (!) 159/99     Pulse Rate 02/26/21 1923 80     Resp 02/26/21 1923 16     Temp 02/26/21 1923 98.4 F (36.9 C)     Temp Source 02/26/21 1923 Oral     SpO2 02/26/21 1923 100 %  Weight --      Height --      Head Circumference --      Peak Flow --      Pain Score 02/26/21 1858 7     Pain Loc --      Pain Edu? --      Excl. in [place]? --    No data found.  Updated Vital Signs BP (!) 159/99 (BP Location: Left Arm)    Pulse 80    Temp 98.4 F (36.9 C) (Oral)    Resp 16    LMP 11/28/2012    SpO2 100%   Visual Acuity Right Eye Distance:   Left Eye Distance:   Bilateral Distance:    Right Eye Near:   Left Eye Near:    Bilateral Near:     Physical Exam Vitals reviewed.  Constitutional:      General: She is awake. She is not in acute distress.    Appearance: Normal appearance. She is well-developed. She is not ill-appearing.     Comments: Very pleasant female appears stated age in no acute distress sitting comfortably in exam room  HENT:     Head: Normocephalic and atraumatic.  Cardiovascular:     Rate and Rhythm: Normal rate and regular rhythm.     Pulses:          Posterior tibial pulses are 2+ on the right side and 2+ on the left side.     Heart sounds: Normal heart sounds, S1 normal and S2 normal. No murmur heard.    Comments: Capillary refill within 2 seconds right toes Pulmonary:     Effort: Pulmonary effort is normal.     Breath sounds: Normal breath sounds. No wheezing, rhonchi or rales.  Abdominal:     Palpations: Abdomen is soft.     Tenderness: There is no abdominal tenderness.  Musculoskeletal:     Right lower leg: No edema.     Left lower leg: No edema.     Right foot: Decreased range of motion. Normal capillary refill. Tenderness and bony tenderness present. No swelling,  deformity or laceration.     Comments: Right foot: Significant tenderness palpation over right great toe and first metatarsal.  No deformity noted.  No bleeding or laceration noted.  Normal active range of motion.  Foot neurovascularly intact.  Psychiatric:        Behavior: Behavior is cooperative.     UC Treatments / Results  Labs (all labs ordered are listed, but only abnormal results are displayed) Labs Reviewed - No data to display  EKG   Radiology DG Foot Complete Right  Result Date: 02/26/2021 CLINICAL DATA:  Pain after trauma. EXAM: RIGHT FOOT COMPLETE - 3+ VIEW COMPARISON:  None. FINDINGS: There is no evidence of fracture or dislocation. There is no evidence of arthropathy or other focal bone abnormality. There is a plantar calcaneal spur. Soft tissues are unremarkable. IMPRESSION: Negative. Electronically Signed   By: Ronney Asters M.D.   On: 02/26/2021 19:46    Procedures Procedures (including critical care time)  Medications Ordered in UC Medications - No data to display  Initial Impression / Assessment and Plan / UC Course  I have reviewed the triage vital signs and the nursing notes.  Pertinent labs & imaging results that were available during my care of the patient were reviewed by me and considered in my medical decision making (see chart for details).     X-ray obtained given mechanism of injury showed  no osseous abnormality.  She was prescribed Naprosyn for pain relief with instructions to take additional NSAIDs with this medication to risk of GI bleeding.  Can use Tylenol for breakthrough pain.  Recommended RICE protocol for additional symptom relief.  She was placed in postop shoe for comfort.  Discussed alarm symptoms that warrant emergent evaluation.  Strict return precautions given to which she expressed.  Final Clinical Impressions(s) / UC Diagnoses   Final diagnoses:  Injury of toe on right foot, initial encounter     Discharge Instructions       Keep your foot elevated and use ice for additional symptom relief.  Take Naprosyn twice daily for pain.  Do not take NSAIDs including aspirin, ibuprofen/Advil, naproxen/Aleve with this medication as it caused medically.  You can take Tylenol.  If your symptoms are not improving within a few days please return or see orthopedics for further evaluation and management.  If you develop any severe symptoms including severe pain, swelling, coolness of your toes, numbness, tingling sensation you should be seen immediately.     ED Prescriptions     Medication Sig Dispense Auth. Provider   naproxen (NAPROSYN) 500 MG tablet Take 1 tablet (500 mg total) by mouth 2 (two) times daily. 30 tablet Alejah Aristizabal, Derry Skill, PA-C      PDMP not reviewed this encounter.   Terrilee Croak, PA-C 02/26/21 2010

## 2021-03-01 ENCOUNTER — Other Ambulatory Visit: Payer: Self-pay

## 2021-03-01 ENCOUNTER — Ambulatory Visit: Payer: Medicaid Other | Admitting: Pharmacist

## 2021-03-01 VITALS — BP 136/84 | HR 79 | Wt 226.0 lb

## 2021-03-01 DIAGNOSIS — R072 Precordial pain: Secondary | ICD-10-CM | POA: Diagnosis not present

## 2021-03-01 DIAGNOSIS — I10 Essential (primary) hypertension: Secondary | ICD-10-CM

## 2021-03-01 MED ORDER — VALSARTAN 320 MG PO TABS: 320.0000 mg | ORAL_TABLET | Freq: Every day | ORAL | 3 refills | Status: DC

## 2021-03-01 NOTE — Patient Instructions (Addendum)
It was nice to see you today!  Your blood pressure goal is < 130/74mmHg  Increase your valsartan from 160mg  to 320mg  once daily  Stop taking your potassium supplement  Continue taking your other medications  Bring in your cuff to your visit next week with Dr Johney Frame

## 2021-03-01 NOTE — Progress Notes (Signed)
Patient ID: Sharon Bond                 DOB: Oct 24, 1965                      MRN: [id number]     HPI: Sharon Bond is a 56 y.o. female referred by Dr. Johney Frame to HTN clinic. PMH is significant for HTN, pre DM, GERD, anxiety, and hypokalemia.  Patient was seen by PCP on 3/3 for chest pain and hypertension and was started on amlodipine, subsequently discontinued due to cough. Work up with PCP including CBC, BMET, BNP were normal, EKG without ischemic changes. Symptoms mildly improved with treatment of HTN but pt reported continued chest pressure, SOB, orthopnea, and occasional palitations at visit with Dr Johney Frame on 04/06/20. BP was elevated at 134/100 at that time and pt was started on valsartan 80mg  daily as well as ischemic work up with coronary CTA and echo. Echo was normal with EF 60-65%, coronary CTA showed calcium score of 0.  She went to the ED on 3/31 for chest pain. EKG with occasional PVCs but did not show acute ischemic changes, trop negative x2, D dimer negative, and pt was discharged without med changes. She was seen by PharmD on 4/1 and started on Bystolic. She saw PCP 4/18 who started pt on buspirone to help with anxiety. Due to pt's fatigue, sleep study ordered for 6/8 to evaluate for potential OSA, TSH, B12 also checked. B12 was low and pt elected to start on weekly B12 injections for a month. A1c had increased from 5.8 to 6.1, healthy lifestyle and weight loss were discussed with pt. Patient thinks that some of her BP issues may be due to anxiety due to be out of work and over health and worry about developing dementia. Mother currently has dementia and resides in assisted living.   I last saw pt 05/23/20 and increased her valsartan to 160mg  which controlled BP well. Saw Dr Johney Frame 07/17/20 - chest heaviness and DOE had improved with BP improvement and no med changes were made. Sleep study showed mild OSA and pt started on CPAP. Pt sent in MyChart message 02/12/21 with home BP readings  of 177/99, 192/105. She was scheduled for follow up with PharmD and Dr Johney Frame.  Pt presents today for follow up. Started taking magnesium supplementation and reports systolic BP improved to 706C but diastolic still elevated. Had chest pressure and headache when BP was high at home but both improved as her BP came back down. Has a BP cuff that measures BP on her forearm, did not bring it in today. Was prescribed naproxen 3 days ago for her foot but hasn't been taking since it made her nauseous. Never got started with CPAP after her sleep study last year. Not taking metoprolol tartrate that's listed on her meds (she takes Bystolic), this has been removed.  Current HTN meds: HCTZ 25mg  daily (AM), valsartan 160mg  daily (PM), Bystolic 5mg  daily (PM) Previously tried: amlodipine (cough), olmesartan (cough), lisinopril (unsure) BP goal: <130/7mmHg   Family History: Diabetes in her brother and mother; Healthy in her father; Heart disease in her maternal aunt; Hypertension in her mother. There is no history of Cancer or Stroke.   Social History: Denies tobacco and drug use, occasional alcohol use.  Lives with her son and husband and used to work for [employer]   Diet: does not add salt to food. Cut out coffee, likes herbal tea.   Exercise:  Starting to walk more.  Wt Readings from Last 3 Encounters:  12/19/20 231 lb (104.8 kg)  12/01/20 234 lb (106.1 kg)  11/16/20 234 lb (106.1 kg)   BP Readings from Last 3 Encounters:  02/26/21 (!) 159/99  02/05/21 (!) 155/83  12/19/20 128/82   Pulse Readings from Last 3 Encounters:  02/26/21 80  02/05/21 73  12/19/20 78    Renal function: CrCl cannot be calculated (Patient's most recent lab result is older than the maximum 21 days allowed.).  Past Medical History:  Diagnosis Date   Allergy    pollen   Anxiety 2022   Eczema    GERD (gastroesophageal reflux disease)    Hypertension    IUD (intrauterine device) in place 2005   removed 2015   Mixed  incontinence    Seasonal allergic rhinitis    Sleep apnea 10/2020   11/16/20--has not rec'd cpap yet   Wears glasses     Current Outpatient Medications on File Prior to Visit  Medication Sig Dispense Refill   albuterol (VENTOLIN HFA) 108 (90 Base) MCG/ACT inhaler Inhale 2 puffs into the lungs every 6 (six) hours as needed for wheezing or shortness of breath. 8 g 2   busPIRone (BUSPAR) 10 MG tablet TAKE 1 TABLET BY MOUTH EVERY DAY 90 tablet 1   EPINEPHrine 0.3 mg/0.3 mL IJ SOAJ injection Inject 0.3 mg into the muscle as needed for anaphylaxis. 1 each 1   hydrochlorothiazide (HYDRODIURIL) 25 MG tablet TAKE 1 TABLET (25 MG TOTAL) BY MOUTH DAILY. 90 tablet 0   Lactobacillus (PROBIOTIC ACIDOPHILUS PO) Take by mouth daily with breakfast.     methocarbamol (ROBAXIN) 500 MG tablet Take 1 tablet (500 mg total) by mouth at bedtime as needed for muscle spasms. 10 tablet 0   metoprolol tartrate (LOPRESSOR) 100 MG tablet Take 100 mg by mouth daily at 6 (six) AM.     naproxen (NAPROSYN) 500 MG tablet Take 1 tablet (500 mg total) by mouth 2 (two) times daily. 30 tablet 0   nebivolol (BYSTOLIC) 5 MG tablet Take 1 tablet (5 mg total) by mouth daily. 90 tablet 2   omeprazole (PRILOSEC OTC) 20 MG tablet Take 1 tablet (20 mg total) by mouth daily. 30 tablet 1   potassium chloride (KLOR-CON) 10 MEQ tablet TAKE 1 TABLET BY MOUTH EVERY DAY 90 tablet 1   traZODone (DESYREL) 50 MG tablet TAKE 0.5-1 TABLETS BY MOUTH AT BEDTIME AS NEEDED FOR SLEEP. 90 tablet 1   triamcinolone cream (KENALOG) 0.1 % Apply 1 application topically as needed. Pt using at least 4 times a day 30 g 2   valsartan (DIOVAN) 160 MG tablet Take 1 tablet (160 mg total) by mouth daily. 90 tablet 3   No current facility-administered medications on file prior to visit.    Allergies  Allergen Reactions   Penicillins Swelling, Other (See Comments) and Rash    Facial swelling Facial swelling   Olmesartan     "cough"   Amlodipine Cough      Assessment/Plan:  1. Hypertension - BP better in clinic than pt's home readings, mildly elevated above goal <130/52mmHg. Will increase valsartan from 160mg  to 320mg  daily and stop her KCl 66meq daily. Continue Bystolic 5mg  daily and HCTZ 25mg  daily. She will bring in her home BP cuff to visit next week with Dr Johney Frame to verify if cuff is measuring accurately. Will check BMET at that time.  Pt had home sleep study 09/20/20 which showed mild OSA with plan  for auto titration of CPAP. Pt states she does not have CPAP machine and never heard any follow up after this study. Uncontrolled OSA may be contributing to HTN as well. Will forward to [facility] and Dr Johney Frame.  Leza Apsey E. Edana Aguado, PharmD, BCACP, [facility] [address] [place], [place] [address] Phone: [phone]; Fax: [phone] 03/01/2021 1:51 PM

## 2021-03-04 NOTE — Progress Notes (Signed)
Cardiology Office Note:    Date:  03/06/2021   ID:  Sharon Bond, DOB [birth year], MRN [id number]  PCP:  [name], [place]  Cardiologist:  None  Advanced Practice Provider:  No care team member to display Electrophysiologist:  None    Referring MD: [name], FNP    History of Present Illness:    Sharon Bond is a 56 y.o. female with a hx of HTN who presents to clinic for follow-up.  Patient saw [name], FNP on 03/28/20 for cough, DOE, and palpitations. BNP was checked and was normal at 6.5. CBC and BNP normal. She was referred to Cardiology for further evaluation.  Patient was seen in clinic on 04/06/20 where she was complaining of chest heaviness, dyspnea on exertion, and elevated blood pressures. Coronary CTA 05/11/20 with no obstructive CAD, calcium score 0. TTE 05/04/20 with LVEF 60-65%, mild LVH, normal diastolic function, normal RV, no significant valve disease.   Last seen in clinic -06/2020 where she was doing very well. Blood pressures controlled.  Today, the patient overall feels well. Injured her right foot when a case of water fell on her foot at [employer] [employer] and is now in a boot. Blood pressures have been 140-190s at home. Her valsartan was increased to 320mg  daily. Also started PO mag which has helped. Occasional HA associated with higher blood pressures. No chest pain, SOB, LE edema, orthopnea or PND. Notably has not started CPAP for mild sleep apnea, which may be contributing.  Family history: Aunt with CHF (deceased in 44s).  Past Medical History:  Diagnosis Date   Allergy    pollen   Anxiety 2022   Eczema    GERD (gastroesophageal reflux disease)    Hypertension    IUD (intrauterine device) in place 2005   removed 2015   Mixed incontinence    Seasonal allergic rhinitis    Sleep apnea 10/2020   11/16/20--has not rec'd cpap yet   Wears glasses     Past Surgical History:  Procedure Laterality Date   DILATATION  & CURETTAGE/HYSTEROSCOPY WITH TRUECLEAR N/A 01/08/2013   Procedure: DILATATION & CURETTAGE/HYSTEROSCOPY WITH TRUCLEAR, polypectomy;  Surgeon: Claiborne Billings A. Pamala Hurry, MD;  Location: [facility];  Service: Gynecology;  Laterality: N/A;   LAPAROSCOPIC TUBAL LIGATION Bilateral 01/08/2013   Procedure: DIAGNOSTIC LAPAROSCOPY Bilateral Salpingectomy;  Surgeon: Claiborne Billings A. Pamala Hurry, MD;  Location: [facility];  Service: Gynecology;  Laterality: Bilateral;   PARTIAL HYSTERECTOMY  2019   WISDOM TOOTH EXTRACTION      Current Medications: Current Meds  Medication Sig   albuterol (VENTOLIN HFA) 108 (90 Base) MCG/ACT inhaler Inhale 2 puffs into the lungs every 6 (six) hours as needed for wheezing or shortness of breath.   busPIRone (BUSPAR) 10 MG tablet TAKE 1 TABLET BY MOUTH EVERY DAY   EPINEPHrine 0.3 mg/0.3 mL IJ SOAJ injection Inject 0.3 mg into the muscle as needed for anaphylaxis.   hydrochlorothiazide (HYDRODIURIL) 25 MG tablet TAKE 1 TABLET (25 MG TOTAL) BY MOUTH DAILY.   Lactobacillus (PROBIOTIC ACIDOPHILUS PO) Take by mouth daily with breakfast.   naproxen (NAPROSYN) 500 MG tablet Take 1 tablet (500 mg total) by mouth 2 (two) times daily.   nebivolol (BYSTOLIC) 5 MG tablet Take 1 tablet (5 mg total) by mouth 2 (two) times daily.   omeprazole (PRILOSEC OTC) 20 MG tablet Take 1 tablet (20 mg total) by mouth daily.   spironolactone (ALDACTONE) 25 MG tablet Take 1 tablet (25 mg  total) by mouth daily.   traZODone (DESYREL) 50 MG tablet TAKE 0.5-1 TABLETS BY MOUTH AT BEDTIME AS NEEDED FOR SLEEP.   triamcinolone cream (KENALOG) 0.1 % Apply 1 application topically as needed. Pt using at least 4 times a day   valsartan (DIOVAN) 320 MG tablet Take 1 tablet (320 mg total) by mouth daily.   [DISCONTINUED] nebivolol (BYSTOLIC) 5 MG tablet Take 1 tablet (5 mg total) by mouth daily.     Allergies:   Penicillins, Olmesartan, and Amlodipine   Social History   Socioeconomic History   Marital status: Married    Spouse name:  Not on file   Number of children: Not on file   Years of education: Not on file   Highest education level: Not on file  Occupational History   Not on file  Tobacco Use   Smoking status: Never   Smokeless tobacco: Never  Vaping Use   Vaping Use: Never used  Substance and Sexual Activity   Alcohol use: Yes    Comment: occasionally during holidays, wine or beer   Drug use: Never   Sexual activity: Yes    Partners: Male    Birth control/protection: Surgical  Other Topics Concern   Not on file  Social History Narrative   married, 5 yo son, 1 yo, 106 yo, exercise - some with walking   Lives at home with spouse and 59 y.o. son   Left handed   Caffeine: decaf coffee on the weekend    Social Determinants of Health   Financial Resource Strain: Not on file  Food Insecurity: Not on file  Transportation Needs: Not on file  Physical Activity: Not on file  Stress: Not on file  Social Connections: Not on file     Family History: The patient's family history includes Diabetes in her brother and mother; Healthy in her father; Heart disease in her maternal aunt; Hypertension in her mother. There is no history of Cancer, Stroke, Sleep apnea, Colon cancer, Colon polyps, Esophageal cancer, Rectal cancer, or Stomach cancer.  ROS:   Please see the history of present illness.    Review of Systems  Constitutional:  Negative for chills, fever and malaise/fatigue.  Eyes:  Negative for blurred vision and redness.  Respiratory:  Negative for shortness of breath.   Cardiovascular:  Negative for chest pain, palpitations, orthopnea, claudication, leg swelling and PND.  Gastrointestinal:  Negative for blood in stool, nausea and vomiting.  Genitourinary:  Negative for dysuria and flank pain.  Musculoskeletal:  Negative for falls.  Neurological:  Positive for headaches. Negative for dizziness and loss of consciousness.  Endo/Heme/Allergies:  Negative for polydipsia.  Psychiatric/Behavioral:  Positive  for depression.    EKGs/Labs/Other Studies Reviewed:    The following studies were reviewed today: Coronary CTA 05/11/20: FINDINGS: Non-cardiac: See separate report from [facility].   No LA appendage thrombus. Pulmonary veins drain normally to the left atrium.   Calcium Score: 0 Agatston units.   Coronary Arteries: Right dominant. The LAD and LCx have separate origins from the left cusp.   LM: No LM, separate origins of LAD and LCx from left cusp.   LAD system: Separate origin off left cusp.  No plaque or stenosis.   Circumflex system: Separate origin off left cusp, early acute angle turn. No plaque or stenosis.   RCA system: No plaque or stenosis.   IMPRESSION: 1. Coronary artery calcium score 0 Agatston units. This suggests low risk for future cardiac events.   2.  No significant coronary disease noted.   3.  Separate origins of LCx and LAD from left cusp.   TTE 05/04/20: IMPRESSIONS     1. Left ventricular ejection fraction, by estimation, is 60 to 65%. The  left ventricle has normal function. The left ventricle has no regional  wall motion abnormalities. There is mild concentric left ventricular  hypertrophy. Left ventricular diastolic  parameters were normal.   2. Right ventricular systolic function is normal. The right ventricular  size is normal.   3. The mitral valve is normal in structure. Trivial mitral valve  regurgitation.   4. The aortic valve is tricuspid. Aortic valve regurgitation is not  visualized. No aortic stenosis is present.   5. The inferior vena cava is normal in size with greater than 50%  respiratory variability, suggesting right atrial pressure of 3 mmHg.   EKG:  No new tracing today  Recent Labs: 03/28/2020: BNP 6.5 05/09/2020: ALT 13; Hemoglobin 15.2; Platelets 237; TSH 1.470 06/15/2020: BUN 12; Creatinine, Ser 0.84; Potassium 4.1; Sodium 141  Recent Lipid Panel    Component Value Date/Time   CHOL 173 08/30/2020 1149    TRIG 74 08/30/2020 1149   HDL 48 08/30/2020 1149   CHOLHDL 3.6 08/30/2020 1149   CHOLHDL 3.5 07/18/2014 0001   VLDL 18 07/18/2014 0001   LDLCALC 111 (H) 08/30/2020 1149     Physical Exam:    VS:  BP 140/88    Pulse 78    Ht 5\' 3"  (1.6 m)    Wt 228 lb 6.4 oz (103.6 kg)    LMP 11/28/2012    SpO2 98%    BMI 40.46 kg/m     Wt Readings from Last 3 Encounters:  03/06/21 228 lb 6.4 oz (103.6 kg)  03/01/21 226 lb (102.5 kg)  12/19/20 231 lb (104.8 kg)     GEN:  Comfortable, NAD HEENT: Normal NECK: No JVD; No carotid bruits CARDIAC: RRR, 1/6 systolic murmur RESPIRATORY:  Clear to auscultation without rales, wheezing or rhonchi  ABDOMEN: Soft, non-tender, non-distended MUSCULOSKELETAL:  Warm, no edema SKIN: Warm and dry NEUROLOGIC:  Alert and oriented x 3 PSYCHIATRIC:  Normal affect   ASSESSMENT:    1. Essential hypertension   2. Dyspnea on exertion   3. Leg edema   4. Precordial pain   5. Obesity, Class III, BMI 40-49.9 (morbid obesity) (HCC)     PLAN:    In order of problems listed above:  #DOE: #Chest Pressure: Patient with worsening chest pressure and shortness of breath on exertion likely due to uncontrolled HTN. Coronary CTA with no obstructive CAD. TTE with normal LVEF, no significant valve disease. Now improved with better control of BP. -Coronary CTA without obstructive disease -TTE with normal LVEF, no significant valve disease -Manage HTN as below  #HTN: Elevated to 140-150s mainly at home despite recent increase in Valsartan. Untreated OSA may also be contributing but has not started on CPAP and does not want to at this time -Continue HCTZ 25mg  daily -Continue valsartan 235mg  daily -Increase nebivolol to 5mg  BID as above -Start spironolactone 25mg  daily -Check BMET next week -Will follow-up with Dr. Onnie Graham for further evaluation; asked her to bring her BP cuff at that time -Did not tolerate amlodipine or lisinopril in the past -Adherent to low Na  diet  #LE edema: Improved. Echo with normal LVEF 60-65%, no significant valve disease. -Continue HCTZ 25mg  daily -Leg elevation at the end of the day  #Obesity: BMI 40. Trying to work  on diet and exercise to lose weight. -Continue diet and lifestyle modifications -Start GLP-1 agonist if able   Medication Adjustments/Labs and Tests Ordered: Current medicines are reviewed at length with the patient today.  Concerns regarding medicines are outlined above.  No orders of the defined types were placed in this encounter.   Meds ordered this encounter  Medications   spironolactone (ALDACTONE) 25 MG tablet    Sig: Take 1 tablet (25 mg total) by mouth daily.    Dispense:  90 tablet    Refill:  1   nebivolol (BYSTOLIC) 5 MG tablet    Sig: Take 1 tablet (5 mg total) by mouth 2 (two) times daily.    Dispense:  180 tablet    Refill:  2    DOSE INCREASE    Patient Instructions  Medication Instructions:   START TAKING SPIRONOLACTONE 25 MG BY MOUTH DAILY  INCREASE YOUR BYSTOLIC TO 5 MG BY MOUTH TWICE DAILY  *If you need a refill on your cardiac medications before your next appointment, please call your pharmacy*   Lab Work:  TODAY--BMET--AS PREVIOUSLY SCHEDULED  If you have labs (blood work) drawn today and your tests are completely normal, you will receive your results only by: [place] (if you have MyChart) OR A paper copy in the mail If you have any lab test that is abnormal or we need to change your treatment, we will call you to review the results.   Follow-Up:  WITH PHARMACIST IN HYPERTENSION CLINIC FOR [facility] BRING YOUR BP RECORDINGS TO THAT VISIT PER DR. Johney Frame  6 MONTHS IN THE OFFICE WITH DR. Johney Frame        Signed, Freada Bergeron, MD  03/06/2021 10:44 AM    [place]

## 2021-03-06 ENCOUNTER — Other Ambulatory Visit: Payer: Medicaid Other | Admitting: *Deleted

## 2021-03-06 ENCOUNTER — Ambulatory Visit: Payer: Medicaid Other | Admitting: Podiatry

## 2021-03-06 ENCOUNTER — Other Ambulatory Visit: Payer: Self-pay

## 2021-03-06 ENCOUNTER — Ambulatory Visit (INDEPENDENT_AMBULATORY_CARE_PROVIDER_SITE_OTHER): Payer: Medicaid Other | Admitting: Cardiology

## 2021-03-06 ENCOUNTER — Ambulatory Visit (INDEPENDENT_AMBULATORY_CARE_PROVIDER_SITE_OTHER): Payer: Medicaid Other

## 2021-03-06 ENCOUNTER — Encounter: Payer: Self-pay | Admitting: Cardiology

## 2021-03-06 VITALS — BP 140/88 | HR 78 | Ht 63.0 in | Wt 228.4 lb

## 2021-03-06 DIAGNOSIS — I10 Essential (primary) hypertension: Secondary | ICD-10-CM | POA: Diagnosis not present

## 2021-03-06 DIAGNOSIS — R072 Precordial pain: Secondary | ICD-10-CM | POA: Diagnosis not present

## 2021-03-06 DIAGNOSIS — R600 Localized edema: Secondary | ICD-10-CM

## 2021-03-06 DIAGNOSIS — M7989 Other specified soft tissue disorders: Secondary | ICD-10-CM

## 2021-03-06 DIAGNOSIS — S9031XA Contusion of right foot, initial encounter: Secondary | ICD-10-CM

## 2021-03-06 DIAGNOSIS — R0609 Other forms of dyspnea: Secondary | ICD-10-CM | POA: Diagnosis not present

## 2021-03-06 LAB — BASIC METABOLIC PANEL
BUN/Creatinine Ratio: 15 (ref 9–23)
BUN: 11 mg/dL (ref 6–24)
CO2: 22 mmol/L (ref 20–29)
Calcium: 9.6 mg/dL (ref 8.7–10.2)
Chloride: 107 mmol/L — ABNORMAL HIGH (ref 96–106)
Creatinine, Ser: 0.71 mg/dL (ref 0.57–1.00)
Glucose: 92 mg/dL (ref 70–99)
Potassium: 4.2 mmol/L (ref 3.5–5.2)
Sodium: 142 mmol/L (ref 134–144)
eGFR: 101 mL/min/{1.73_m2} (ref >59)

## 2021-03-06 MED ORDER — MELOXICAM 15 MG PO TABS: 15.0000 mg | ORAL_TABLET | Freq: Every day | ORAL | 0 refills | Status: DC

## 2021-03-06 MED ORDER — SPIRONOLACTONE 25 MG PO TABS: 25.0000 mg | ORAL_TABLET | Freq: Every day | ORAL | 1 refills | Status: DC

## 2021-03-06 MED ORDER — NEBIVOLOL HCL 5 MG PO TABS: 5.0000 mg | ORAL_TABLET | Freq: Two times a day (BID) | ORAL | 2 refills | Status: DC

## 2021-03-06 NOTE — Patient Instructions (Signed)
Medication Instructions:   START TAKING SPIRONOLACTONE 25 MG BY MOUTH DAILY  INCREASE YOUR BYSTOLIC TO 5 MG BY MOUTH TWICE DAILY  *If you need a refill on your cardiac medications before your next appointment, please call your pharmacy*   Lab Work:  TODAY--BMET--AS PREVIOUSLY SCHEDULED  If you have labs (blood work) drawn today and your tests are completely normal, you will receive your results only by: [place] (if you have MyChart) OR A paper copy in the mail If you have any lab test that is abnormal or we need to change your treatment, we will call you to review the results.   Follow-Up:  WITH PHARMACIST IN HYPERTENSION CLINIC FOR [facility]LY [facility] BRING YOUR BP RECORDINGS TO THAT VISIT PER DR. PEMBERTON  6 MONTHS IN THE OFFICE WITH DR. Johney Frame

## 2021-03-10 NOTE — Progress Notes (Signed)
Subjective:   Patient ID: Sharon Bond, female   DOB: 56 y.o.   MRN: [id number]   HPI 56 year old female presents the office today for concerns of foot injury which occurred last Monday.  She is a case of water fell on her right foot.  That time she went to urgent care and she was told there was nothing broken but she feels pins-and-needles and throbbing discomfort still.  She is still wearing a surgical shoe.  She did not have the nerve symptoms prior to the injury.   Review of Systems  All other systems reviewed and are negative.  Past Medical History:  Diagnosis Date   Allergy    pollen   Anxiety 2022   Eczema    GERD (gastroesophageal reflux disease)    Hypertension    IUD (intrauterine device) in place 2005   removed 2015   Mixed incontinence    Seasonal allergic rhinitis    Sleep apnea 10/2020   11/16/20--has not rec'd cpap yet   Wears glasses     Past Surgical History:  Procedure Laterality Date   DILATATION & CURETTAGE/HYSTEROSCOPY WITH TRUECLEAR N/A 01/08/2013   Procedure: DILATATION & CURETTAGE/HYSTEROSCOPY WITH TRUCLEAR, polypectomy;  Surgeon: Claiborne Billings A. Pamala Hurry, MD;  Location: [facility];  Service: Gynecology;  Laterality: N/A;   LAPAROSCOPIC TUBAL LIGATION Bilateral 01/08/2013   Procedure: DIAGNOSTIC LAPAROSCOPY Bilateral Salpingectomy;  Surgeon: Claiborne Billings A. Pamala Hurry, MD;  Location: [facility];  Service: Gynecology;  Laterality: Bilateral;   PARTIAL HYSTERECTOMY  2019   WISDOM TOOTH EXTRACTION       Current Outpatient Medications:    meloxicam (MOBIC) 15 MG tablet, Take 1 tablet (15 mg total) by mouth daily., Disp: 30 tablet, Rfl: 0   albuterol (VENTOLIN HFA) 108 (90 Base) MCG/ACT inhaler, Inhale 2 puffs into the lungs every 6 (six) hours as needed for wheezing or shortness of breath., Disp: 8 g, Rfl: 2   busPIRone (BUSPAR) 10 MG tablet, TAKE 1 TABLET BY MOUTH EVERY DAY, Disp: 90 tablet, Rfl: 1   EPINEPHrine 0.3 mg/0.3 mL IJ SOAJ injection, Inject 0.3 mg into the muscle as  needed for anaphylaxis., Disp: 1 each, Rfl: 1   hydrochlorothiazide (HYDRODIURIL) 25 MG tablet, TAKE 1 TABLET (25 MG TOTAL) BY MOUTH DAILY., Disp: 90 tablet, Rfl: 0   Lactobacillus (PROBIOTIC ACIDOPHILUS PO), Take by mouth daily with breakfast., Disp: , Rfl:    methocarbamol (ROBAXIN) 500 MG tablet, Take 1 tablet (500 mg total) by mouth at bedtime as needed for muscle spasms. (Patient not taking: Reported on 03/06/2021), Disp: 10 tablet, Rfl: 0   naproxen (NAPROSYN) 500 MG tablet, Take 1 tablet (500 mg total) by mouth 2 (two) times daily., Disp: 30 tablet, Rfl: 0   nebivolol (BYSTOLIC) 5 MG tablet, Take 1 tablet (5 mg total) by mouth 2 (two) times daily., Disp: 180 tablet, Rfl: 2   omeprazole (PRILOSEC OTC) 20 MG tablet, Take 1 tablet (20 mg total) by mouth daily., Disp: 30 tablet, Rfl: 1   spironolactone (ALDACTONE) 25 MG tablet, Take 1 tablet (25 mg total) by mouth daily., Disp: 90 tablet, Rfl: 1   traZODone (DESYREL) 50 MG tablet, TAKE 0.5-1 TABLETS BY MOUTH AT BEDTIME AS NEEDED FOR SLEEP., Disp: 90 tablet, Rfl: 1   triamcinolone cream (KENALOG) 0.1 %, Apply 1 application topically as needed. Pt using at least 4 times a day, Disp: 30 g, Rfl: 2   valsartan (DIOVAN) 320 MG tablet, Take 1 tablet (320 mg total) by mouth daily., Disp: 90 tablet,  Rfl: 3  Allergies  Allergen Reactions   Penicillins Swelling, Other (See Comments) and Rash    Facial swelling Facial swelling   Olmesartan     "cough"   Amlodipine Cough          Objective:  Physical Exam  General: AAO x3, NAD  Dermatological: Skin is warm, dry and supple bilateral. There are no open sores, no preulcerative lesions, no rash or signs of infection present.  Vascular: Dorsalis Pedis artery and Posterior Tibial artery pedal pulses are 2/4 bilateral with immedate capillary fill time. There is no pain with calf compression, swelling, warmth, erythema.   Neruologic: There is a positive Tinel's sign noted along the superficial  peroneal nerve and dorsal aspect of the foot.  Musculoskeletal: Tenderness palpation of the digits.  Mild edema present.  No pain in the metatarsals or ankle.  Muscular strength 5/5 in all groups tested bilateral.  Gait: Unassisted, Nonantalgic.       Assessment:   Neuritis, contusion, swelling     Plan:  -Treatment options discussed including all alternatives, risks, and complications -Etiology of symptoms were discussed -X-rays were obtained and reviewed with the patient.  No evidence of acute fracture noted today. -Recommend to remain in the surgical shoe for now until symptoms resolve.  As the pain improves she can transition to regular shoe but I would avoid a shoe that puts too much pressure on the dorsal foot.  Prescribed meloxicam to take as needed.  Hold off on any other anti-inflammatories.  Return in about 4 weeks (around 04/03/2021).  Trula Slade DPM

## 2021-03-29 ENCOUNTER — Other Ambulatory Visit: Payer: Self-pay

## 2021-03-29 ENCOUNTER — Ambulatory Visit (INDEPENDENT_AMBULATORY_CARE_PROVIDER_SITE_OTHER): Payer: Medicaid Other | Admitting: Pharmacist

## 2021-03-29 VITALS — BP 132/98 | HR 70

## 2021-03-29 DIAGNOSIS — I10 Essential (primary) hypertension: Secondary | ICD-10-CM | POA: Diagnosis not present

## 2021-03-29 MED ORDER — NEBIVOLOL HCL 20 MG PO TABS: 20.0000 mg | ORAL_TABLET | Freq: Every day | ORAL | 3 refills | Status: DC

## 2021-03-29 NOTE — Progress Notes (Signed)
Patient ID: [name]                 DOB: 02/05/1965                      MRN: [id number] ? ? ? ? ?HPI: ?Sharon Bond is a 56 y.o. female referred by Dr. Johney Frame to HTN clinic. PMH is significant for HTN, pre DM, GERD, anxiety, and hypokalemia.  Hx of continued chest pressure and SOB thought to be secondary to HTN as ischemic work up was negative. Echo was normal with EF 60-65%, coronary CTA showed calcium score of 0. Has also followed with PCP who started pt on buspirone to help with anxiety and supplemented low B12 levels. Also has hx of mild OSA but doesn't wish to start on CPAP. ? ?Has stress from helping to care for her mother who has dementia and resides in assisted living. Has been followed by MD and PharmD for the past year or so with changes to her HTN regimen. Most recently, pt sent in MyChart message 02/12/21 with home BP readings of 177/99, 192/105. I saw pt 03/01/21 and increased her valsartan to '320mg'$  daily and stopped her KCl 66mq. F/u BP with Dr PJohney Frameremained elevated at 140/88, pt with chest pressure and DOE likely secondary to uncontrolled HTN, BMET was stable. Bystolic was increased to '5mg'$  BID and spironolactone '25mg'$  daily was added. ? ?Pt presents today for follow up. Tolerating medication changes well. SOB and headaches have improved since her last visit. Denies dizziness and LE edema. Took AM meds at 7:30 today. Has been going to the gym 3x a week for the past month and walking a mile on the treadmill. Not using any NSAIDs, meloxicam and naproxen removed from med list. Brings in home cuff, she uses a bicep cuff on her left forearm. ? ?Home automatic cuff -197/108, recheck 187/101 ?Clinic manual reading - 132/98 ? ?Current HTN meds: HCTZ '25mg'$  daily (AM), valsartan '320mg'$  daily (PM), Bystolic '5mg'$  BID, spironolactone '25mg'$  daily (AM) ?Previously tried: amlodipine (cough), olmesartan (cough)(cough), lisinopril (unsure) ?BP goal: <130/81mg ?  ?Family History: Diabetes in her brother and  mother; Healthy in her father; Heart disease in her maternal aunt; Hypertension in her mother. There is no history of Cancer or Stroke. ?  ?Social History: Denies tobacco and drug use, occasional alcohol use. Lives with her son and husband and used to work for [employer]  ?Diet: does not add salt to food. Cut out coffee, likes herbal tea. ?  ?Exercise: Going to the gym for the last month - 3x a week, walking on the treadmill a mile ? ?Wt Readings from Last 3 Encounters:  ?03/06/21 228 lb 6.4 oz (103.6 kg)  ?03/01/21 226 lb (102.5 kg)  ?12/19/20 231 lb (104.8 kg)  ? ?BP Readings from Last 3 Encounters:  ?03/06/21 140/88  ?03/01/21 136/84  ?02/26/21 (!) 159/99  ? ?Pulse Readings from Last 3 Encounters:  ?03/06/21 78  ?03/01/21 79  ?02/26/21 80  ? ? ?Renal function: ?CrCl cannot be calculated (Patient's most recent lab result is older than the maximum 21 days allowed.). ? ?Past Medical History:  ?Diagnosis Date  ? Allergy   ? pollen  ? Anxiety 2022  ? Eczema   ? GERD (gastroesophageal reflux disease)   ? Hypertension   ? IUD (intrauterine device) in place 2005  ? removed 2015  ? Mixed incontinence   ? Seasonal allergic rhinitis   ? Sleep apnea 10/2020  ?  11/16/20--has not rec'd cpap yet  ? Wears glasses   ? ? ?Current Outpatient Medications on File Prior to Visit  ?Medication Sig Dispense Refill  ? albuterol (VENTOLIN HFA) 108 (90 Base) MCG/ACT inhaler Inhale 2 puffs into the lungs every 6 (six) hours as needed for wheezing or shortness of breath. 8 g 2  ? busPIRone (BUSPAR) 10 MG tablet TAKE 1 TABLET BY MOUTH EVERY DAY 90 tablet 1  ? EPINEPHrine 0.3 mg/0.3 mL IJ SOAJ injection Inject 0.3 mg into the muscle as needed for anaphylaxis. 1 each 1  ? hydrochlorothiazide (HYDRODIURIL) 25 MG tablet TAKE 1 TABLET (25 MG TOTAL) BY MOUTH DAILY. 90 tablet 0  ? Lactobacillus (PROBIOTIC ACIDOPHILUS PO) Take by mouth daily with breakfast.    ? meloxicam (MOBIC) 15 MG tablet Take 1 tablet (15 mg total) by mouth daily. 30 tablet 0  ?  methocarbamol (ROBAXIN) 500 MG tablet Take 1 tablet (500 mg total) by mouth at bedtime as needed for muscle spasms. (Patient not taking: Reported on 03/06/2021) 10 tablet 0  ? naproxen (NAPROSYN) 500 MG tablet Take 1 tablet (500 mg total) by mouth 2 (two) times daily. 30 tablet 0  ? nebivolol (BYSTOLIC) 5 MG tablet Take 1 tablet (5 mg total) by mouth 2 (two) times daily. 180 tablet 2  ? omeprazole (PRILOSEC OTC) 20 MG tablet Take 1 tablet (20 mg total) by mouth daily. 30 tablet 1  ? spironolactone (ALDACTONE) 25 MG tablet Take 1 tablet (25 mg total) by mouth daily. 90 tablet 1  ? traZODone (DESYREL) 50 MG tablet TAKE 0.5-1 TABLETS BY MOUTH AT BEDTIME AS NEEDED FOR SLEEP. 90 tablet 1  ? triamcinolone cream (KENALOG) 0.1 % Apply 1 application topically as needed. Pt using at least 4 times a day 30 g 2  ? valsartan (DIOVAN) 320 MG tablet Take 1 tablet (320 mg total) by mouth daily. 90 tablet 3  ? ?No current facility-administered medications on file prior to visit.  ? ? ?Allergies  ?Allergen Reactions  ? Penicillins Swelling, Other (See Comments) and Rash  ?  Facial swelling ?Facial swelling  ? Olmesartan   ?  "cough"  ? Amlodipine Cough  ? ? ? ?Assessment/Plan: ? ?1. Hypertension - Systolic  BP has improved and is close to goal but diastolic BP remains elevated. Will increase Bystolic from '10mg'$  to '20mg'$  daily and continue valsartan '320mg'$ , HCTZ '25mg'$  daily, and spironolactone '25mg'$  daily. Checking BMET today with recent spironolactone start. Her home cuff is not measuring accurately - SBP was > 60 points higher than manual reading. She is unable to purchase new BP cuff at this time but will stop monitoring with her old cuff. Recommended in the future looking for adult large bicep cuff to measure BP on her upper arm instead of lower arm. Encouraged pt to continue with her exercise - started walking at the gym 3x a week for the past month. She sees PCP next month who will check her BP. I'll call her if BP remains elevated at  that time and further medication changes are needed. ? ?2. Weight loss - Pt's insurance does not cover [place] therapy unfortunately. Encouraged pt to continue with exercise at the gym and focus on heart-healthy diet. ? ?Travone Georg E. Kaydi Kley, PharmD, BCACP, CPP ?[facility], [place], [place] [address] ?Phone: [phone]; Fax: [phone] ?03/29/2021 7:50 AM ? ? ?

## 2021-03-29 NOTE — Patient Instructions (Signed)
It was nice to see you today ? ?Your blood pressure goal is < 130/54mHg ? ?Increase your Bystolic (nebivolol) to '20mg'$  - 1 tablet once daily (PM) ? ?Continue taking your other medications ? ?We'll check labs today ? ?Keep your follow up with primary care next month, and I'll reach out if your blood pressure is still elevated then ? ?

## 2021-04-03 ENCOUNTER — Ambulatory Visit: Payer: Medicaid Other | Admitting: Podiatry

## 2021-04-08 LAB — BASIC METABOLIC PANEL
BUN/Creatinine Ratio: 14 (ref 9–23)
BUN: 10 mg/dL (ref 6–24)
Calcium: 9.4 mg/dL (ref 8.7–10.2)
Chloride: 105 mmol/L (ref 96–106)
Creatinine, Ser: 0.7 mg/dL (ref 0.57–1.00)
Glucose: 115 mg/dL — ABNORMAL HIGH (ref 70–99)
Potassium: 3.7 mmol/L (ref 3.5–5.2)
Sodium: 142 mmol/L (ref 134–144)
eGFR: 103 mL/min/{1.73_m2} (ref >59)

## 2021-05-09 ENCOUNTER — Ambulatory Visit (INDEPENDENT_AMBULATORY_CARE_PROVIDER_SITE_OTHER): Payer: Medicaid Other | Admitting: Nurse Practitioner

## 2021-05-09 ENCOUNTER — Encounter: Payer: Self-pay | Admitting: Nurse Practitioner

## 2021-05-09 ENCOUNTER — Telehealth: Payer: Self-pay | Admitting: Pharmacist

## 2021-05-09 VITALS — BP 128/74 | HR 72 | Temp 97.9°F | Ht 63.0 in | Wt 227.0 lb

## 2021-05-09 DIAGNOSIS — R21 Rash and other nonspecific skin eruption: Secondary | ICD-10-CM

## 2021-05-09 DIAGNOSIS — Z6841 Body Mass Index (BMI) 40.0 and over, adult: Secondary | ICD-10-CM

## 2021-05-09 DIAGNOSIS — M25552 Pain in left hip: Secondary | ICD-10-CM

## 2021-05-09 DIAGNOSIS — R7309 Other abnormal glucose: Secondary | ICD-10-CM

## 2021-05-09 DIAGNOSIS — Z0000 Encounter for general adult medical examination without abnormal findings: Secondary | ICD-10-CM | POA: Diagnosis not present

## 2021-05-09 DIAGNOSIS — I10 Essential (primary) hypertension: Secondary | ICD-10-CM

## 2021-05-09 DIAGNOSIS — M25551 Pain in right hip: Secondary | ICD-10-CM

## 2021-05-09 DIAGNOSIS — E66813 Obesity, class 3: Secondary | ICD-10-CM

## 2021-05-09 DIAGNOSIS — E7800 Pure hypercholesterolemia, unspecified: Secondary | ICD-10-CM | POA: Diagnosis not present

## 2021-05-09 DIAGNOSIS — Z79899 Other long term (current) drug therapy: Secondary | ICD-10-CM

## 2021-05-09 LAB — POCT URINALYSIS DIPSTICK
Bilirubin, UA: NEGATIVE
Glucose, UA: NEGATIVE
Ketones, UA: NEGATIVE
Leukocytes, UA: NEGATIVE
Nitrite, UA: NEGATIVE
Protein, UA: NEGATIVE
Spec Grav, UA: >=1.03 — AB (ref 1.010–1.025)
Urobilinogen, UA: 0.2 E.U./dL
pH, UA: 5.5 (ref 5.0–8.0)

## 2021-05-09 MED ORDER — TRIAMCINOLONE ACETONIDE 0.1 % EX CREA: 1.0000 "application " | TOPICAL_CREAM | CUTANEOUS | 2 refills | Status: DC | PRN

## 2021-05-09 NOTE — Telephone Encounter (Signed)
Pt seen today at PCP office, BP improved since last visit with me from from 132/98 to 128/74. She reports feeling well on meds. She inquires about referral to HTN clinic. Advised her that she was, and this was the visit she had with me for her BP last month. No med changes needed as BP well controlled after I increased her Bystolic last month and pt is tolerating meds well. ?

## 2021-05-09 NOTE — Patient Instructions (Signed)

## 2021-05-09 NOTE — Progress Notes (Signed)
?Industrial/product designer as a Education administrator for [employer], FNP.,have documented all relevant documentation on the behalf of [name], FNP,as directed by  [name], FNP while in the presence of [name], [place]. ? ?This visit occurred during the SARS-CoV-2 public health emergency.  Safety protocols were in place, including screening questions prior to the visit, additional usage of staff PPE, and extensive cleaning of exam room while observing appropriate contact time as indicated for disinfecting solutions. ? ?Subjective:  ?  ? Patient ID: Sharon Bond , female    DOB: August 28, 1965 , 56 y.o.   MRN: [id number] ? ? ?Chief Complaint  ?Patient presents with  ? Annual Exam  ? ? ?HPI ? ?Here for hm. She was to be referred to the hypertension clinic by cardiology but has not heard back from them. Her average meals for the day are cantaloupe, green beans and rice. 1 gallon of water a day ?  ? ?Past Medical History:  ?Diagnosis Date  ? Allergy   ? pollen  ? Anxiety 2022  ? Eczema   ? GERD (gastroesophageal reflux disease)   ? Hypertension   ? IUD (intrauterine device) in place 2005  ? removed 2015  ? Mixed incontinence   ? Seasonal allergic rhinitis   ? Sleep apnea 10/2020  ? 11/16/20--has not rec'd cpap yet  ? Wears glasses   ?  ? ?Family History  ?Problem Relation Age of Onset  ? Diabetes Mother   ? Hypertension Mother   ? Healthy Father   ? Diabetes Brother   ? Heart disease Maternal Aunt   ? Cancer Neg Hx   ? Stroke Neg Hx   ? Sleep apnea Neg Hx   ? Colon cancer Neg Hx   ? Colon polyps Neg Hx   ? Esophageal cancer Neg Hx   ? Rectal cancer Neg Hx   ? Stomach cancer Neg Hx   ? ? ? ?Current Outpatient Medications:  ?  albuterol (VENTOLIN HFA) 108 (90 Base) MCG/ACT inhaler, Inhale 2 puffs into the lungs every 6 (six) hours as needed for wheezing or shortness of breath., Disp: 8 g, Rfl: 2 ?  busPIRone (BUSPAR) 10 MG tablet, TAKE 1 TABLET BY MOUTH EVERY DAY, Disp: 90 tablet, Rfl: 1 ?  EPINEPHrine 0.3 mg/0.3 mL IJ SOAJ  injection, Inject 0.3 mg into the muscle as needed for anaphylaxis., Disp: 1 each, Rfl: 1 ?  hydrochlorothiazide (HYDRODIURIL) 25 MG tablet, TAKE 1 TABLET (25 MG TOTAL) BY MOUTH DAILY., Disp: 90 tablet, Rfl: 0 ?  Lactobacillus (PROBIOTIC ACIDOPHILUS PO), Take by mouth daily with breakfast., Disp: , Rfl:  ?  methocarbamol (ROBAXIN) 500 MG tablet, Take 1 tablet (500 mg total) by mouth at bedtime as needed for muscle spasms., Disp: 10 tablet, Rfl: 0 ?  Nebivolol HCl (BYSTOLIC) 20 MG TABS, Take 1 tablet (20 mg total) by mouth daily., Disp: 90 tablet, Rfl: 3 ?  omeprazole (PRILOSEC OTC) 20 MG tablet, Take 1 tablet (20 mg total) by mouth daily., Disp: 30 tablet, Rfl: 1 ?  spironolactone (ALDACTONE) 25 MG tablet, Take 1 tablet (25 mg total) by mouth daily., Disp: 90 tablet, Rfl: 1 ?  valsartan (DIOVAN) 320 MG tablet, Take 1 tablet (320 mg total) by mouth daily., Disp: 90 tablet, Rfl: 3 ?  traZODone (DESYREL) 50 MG tablet, TAKE 0.5-1 TABLETS BY MOUTH AT BEDTIME AS NEEDED FOR SLEEP., Disp: 90 tablet, Rfl: 1 ?  triamcinolone cream (KENALOG) 0.1 %, Apply 1 application. topically as needed. Pt using  at least 4 times a day, Disp: 30 g, Rfl: 2  ? ?Allergies  ?Allergen Reactions  ? Penicillins Swelling, Other (See Comments) and Rash  ?  Facial swelling ?Facial swelling  ? Olmesartan   ?  "cough"  ? Amlodipine Cough  ?  ? ? ?The patient states she is status post hysterectomy.    Patient's last menstrual period was 11/28/2012.. no abnormal vaginal bleeding. Negative for: breast discharge, breast lump(s), breast pain and breast self exam. Associated symptoms include abnormal vaginal bleeding. Pertinent negatives include abnormal bleeding (hematology), anxiety, decreased libido, depression, difficulty falling sleep, dyspareunia, history of infertility, nocturia, sexual dysfunction, sleep disturbances, urinary incontinence, urinary urgency, vaginal discharge and vaginal itching. Diet: low salt diet, regular. She is no longer eating  meat and rice. The patient states her exercise level is 3 days approximately 30 minutes each time.  ? ?The patient's tobacco use is:  ?Social History  ? ?Tobacco Use  ?Smoking Status Never  ?Smokeless Tobacco Never  ? ?She has been exposed to passive smoke. The patient's alcohol use is:  ?Social History  ? ?Substance and Sexual Activity  ?Alcohol Use Yes  ? Comment: occasionally during holidays, wine or beer  ? ? ? ?Review of Systems  ?Constitutional: Negative.   ?HENT: Negative.    ?Eyes: Negative.   ?Respiratory: Negative.    ?Cardiovascular: Negative.   ?Gastrointestinal: Negative.   ?Endocrine: Negative.   ?Genitourinary: Negative.   ?Musculoskeletal: Negative.   ?Skin: Negative.   ?Allergic/Immunologic: Negative.   ?Neurological: Negative.   ?Hematological: Negative.   ?Psychiatric/Behavioral: Negative.     ? ?Today's Vitals  ? 05/09/21 0855  ?BP: 128/74  ?Pulse: 72  ?Temp: 97.9 ?F (36.6 ?C)  ?TempSrc: Oral  ?Weight: 227 lb (103 kg)  ?Height: '5\' 3"'$  (1.6 m)  ? ?Body mass index is 40.21 kg/m?.  ?Wt Readings from Last 3 Encounters:  ?05/09/21 227 lb (103 kg)  ?03/06/21 228 lb 6.4 oz (103.6 kg)  ?03/01/21 226 lb (102.5 kg)  ? ? ?Objective:  ?Physical Exam ?Vitals reviewed.  ?Constitutional:   ?   General: She is not in acute distress. ?   Appearance: Normal appearance. She is well-developed. She is obese.  ?HENT:  ?   Head: Normocephalic and atraumatic.  ?   Right Ear: Hearing, tympanic membrane, ear canal and external ear normal. There is no impacted cerumen.  ?   Left Ear: Hearing, tympanic membrane, ear canal and external ear normal. There is no impacted cerumen.  ?   Nose:  ?   Comments: Deferred - masked ?   Mouth/Throat:  ?   Comments: Deferred - masked ?Eyes:  ?   General: Lids are normal.  ?   Extraocular Movements: Extraocular movements intact.  ?   Conjunctiva/sclera: Conjunctivae normal.  ?   Pupils: Pupils are equal, round, and reactive to light.  ?   Funduscopic exam: ?   Right eye: No papilledema.      ?   Left eye: No papilledema.  ?Neck:  ?   Thyroid: No thyroid mass.  ?   Vascular: No carotid bruit.  ?Cardiovascular:  ?   Rate and Rhythm: Normal rate and regular rhythm.  ?   Pulses: Normal pulses.  ?   Heart sounds: Normal heart sounds. No murmur heard. ?Pulmonary:  ?   Effort: Pulmonary effort is normal. No respiratory distress.  ?   Breath sounds: Normal breath sounds. No wheezing.  ?Chest:  ?   Chest wall: No  mass.  ?Breasts: ?   Tanner Score is 5.  ?   Right: Normal. No mass or tenderness.  ?   Left: Normal. No mass or tenderness.  ?Abdominal:  ?   General: Abdomen is flat. Bowel sounds are normal. There is no distension.  ?   Palpations: Abdomen is soft.  ?   Tenderness: There is no abdominal tenderness.  ?Musculoskeletal:     ?   General: No swelling or tenderness. Normal range of motion.  ?   Cervical back: Full passive range of motion without pain, normal range of motion and neck supple.  ?   Right lower leg: No edema.  ?   Left lower leg: No edema.  ?Lymphadenopathy:  ?   Upper Body:  ?   Right upper body: No supraclavicular, axillary or pectoral adenopathy.  ?   Left upper body: No supraclavicular, axillary or pectoral adenopathy.  ?Skin: ?   General: Skin is warm and dry.  ?   Capillary Refill: Capillary refill takes less than 2 seconds.  ?Neurological:  ?   General: No focal deficit present.  ?   Mental Status: She is alert and oriented to person, place, and time.  ?   Cranial Nerves: No cranial nerve deficit.  ?   Sensory: No sensory deficit.  ?   Motor: No weakness.  ?Psychiatric:     ?   Mood and Affect: Mood normal.     ?   Behavior: Behavior normal.     ?   Thought Content: Thought content normal.     ?   Judgment: Judgment normal.  ?  ? ?   ?Assessment And Plan:  ?   ?1. Encounter for general adult medical examination w/o abnormal findings ?Behavior modifications discussed and diet history reviewed.   ?Pt will continue to exercise regularly and modify diet with low GI, plant based foods and  decrease intake of processed foods.  ?Recommend intake of daily multivitamin, Vitamin D, and calcium.  ?Recommend mammogram and colonoscopy (up to date) for preventive screenings, as well as recommend immuniza

## 2021-05-10 LAB — CBC
Hematocrit: 41.4 % (ref 34.0–46.6)
Hemoglobin: 13.8 g/dL (ref 11.1–15.9)
MCH: 29.1 pg (ref 26.6–33.0)
MCHC: 33.3 g/dL (ref 31.5–35.7)
MCV: 87 fL (ref 79–97)
Platelets: 190 10*3/uL (ref 150–450)
RBC: 4.74 x10E6/uL (ref 3.77–5.28)
RDW: 13.1 % (ref 11.7–15.4)
WBC: 6.6 10*3/uL (ref 3.4–10.8)

## 2021-05-10 LAB — LIPID PANEL
Chol/HDL Ratio: 3.7 ratio (ref 0.0–4.4)
Cholesterol, Total: 203 mg/dL — ABNORMAL HIGH (ref 100–199)
HDL: 55 mg/dL (ref >39)
LDL Chol Calc (NIH): 132 mg/dL — ABNORMAL HIGH (ref 0–99)
Triglycerides: 91 mg/dL (ref 0–149)
VLDL Cholesterol Cal: 16 mg/dL (ref 5–40)

## 2021-05-10 LAB — AUTOIMMUNE PROFILE
Anti Nuclear Antibody (ANA): POSITIVE — AB
Complement C3, Serum: 168 mg/dL — ABNORMAL HIGH (ref 82–167)
dsDNA Ab: <1 IU/mL (ref 0–9)

## 2021-05-10 LAB — HEMOGLOBIN A1C
Est. average glucose Bld gHb Est-mCnc: 128 mg/dL
Hgb A1c MFr Bld: 6.1 % — ABNORMAL HIGH (ref 4.8–5.6)

## 2021-05-10 LAB — SEDIMENTATION RATE: Sed Rate: 51 mm/hr — ABNORMAL HIGH (ref 0–40)

## 2021-05-10 LAB — MICROALBUMIN / CREATININE URINE RATIO
Creatinine, Urine: 219.7 mg/dL
Microalb/Creat Ratio: 8 mg/g creat (ref 0–29)
Microalbumin, Urine: 16.7 ug/mL

## 2021-05-22 ENCOUNTER — Ambulatory Visit: Payer: Medicaid Other

## 2021-05-24 ENCOUNTER — Ambulatory Visit (INDEPENDENT_AMBULATORY_CARE_PROVIDER_SITE_OTHER): Payer: Medicaid Other

## 2021-05-24 VITALS — BP 128/80 | HR 74 | Temp 98.1°F | Ht 63.0 in | Wt 227.0 lb

## 2021-05-24 DIAGNOSIS — Z23 Encounter for immunization: Secondary | ICD-10-CM | POA: Diagnosis not present

## 2021-05-24 NOTE — Progress Notes (Signed)
Pt presents today for second shingrix.  

## 2021-06-25 ENCOUNTER — Other Ambulatory Visit: Payer: Self-pay | Admitting: Nurse Practitioner

## 2021-06-25 DIAGNOSIS — F419 Anxiety disorder, unspecified: Secondary | ICD-10-CM

## 2021-07-18 ENCOUNTER — Other Ambulatory Visit: Payer: Self-pay | Admitting: Nurse Practitioner

## 2021-07-18 DIAGNOSIS — F419 Anxiety disorder, unspecified: Secondary | ICD-10-CM

## 2021-08-14 ENCOUNTER — Other Ambulatory Visit: Payer: Self-pay

## 2021-08-14 ENCOUNTER — Encounter: Payer: Self-pay | Admitting: Registered"

## 2021-08-14 ENCOUNTER — Encounter: Payer: Medicaid Other | Attending: Nurse Practitioner | Admitting: Registered"

## 2021-08-14 DIAGNOSIS — I10 Essential (primary) hypertension: Secondary | ICD-10-CM | POA: Insufficient documentation

## 2021-08-14 DIAGNOSIS — Z713 Dietary counseling and surveillance: Secondary | ICD-10-CM | POA: Insufficient documentation

## 2021-08-14 DIAGNOSIS — E7800 Pure hypercholesterolemia, unspecified: Secondary | ICD-10-CM | POA: Diagnosis not present

## 2021-08-14 MED ORDER — NEBIVOLOL HCL 20 MG PO TABS: 20.0000 mg | ORAL_TABLET | Freq: Every day | ORAL | 2 refills | Status: DC

## 2021-08-14 NOTE — Progress Notes (Signed)
Medical Nutrition Therapy  Appointment Start time:  8:50  Appointment End time:  9:40  Primary concerns today: improving health, meal ideas, weight loss  Referral diagnosis: high cholesterol  Preferred learning style: no preference indicated Learning readiness: ready, change in progress   NUTRITION ASSESSMENT   States she is having a hard time getting rid of weight. States she does not eat a lot. States she gained weight during pandemic and has had some hormonal changes. States she knows hormonal changes are a big part of her challenge.   States she has problems sleeping. States she does not have an appetite and has history of meal prepping and will not eat food prepared. States she has reduced coffee intake a day, from 4 cups/day to 1 cup/day. States she also drinks herbal teas during the day and she's not hungry.   States she used to walk 3 miles a day.   States her priority is to stay healthy due to recent health scare but also wants to lose a few pounds in the process because feels like weight is contributing to health challenges. States her BP increased at that time of health scare. Since then, states she no longer eats fried foods, meat choices are baked chicken and salmon, and has reduced dairy intake. States she drinks oat milk in coffee and no longer has stomach aches. States she does not eat in the evenings because she gets home too late, around 10 pm. States if she does eat, it will be a bowl of cereal.   States she works seasonally at [employer]. 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 States on her days off she is extremely tired. 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 States mom moved in with her in 04/2020.   Clinical Medical Hx: essential HTN Medications: See list Labs: elevated A1c (6.1), elevated Chol (203), elevated LDL Chol (132) Notable Signs/Symptoms: increased fatigue, no  motivation to do anything  Lifestyle & Dietary Hx  Estimated daily fluid intake: 128+ oz Supplements: See list Sleep: 5-6 hrs/night; interrupted sleep Stress / self-care: read Current average weekly physical activity: none reported  24-Hr Dietary Recall First Meal: coffee Snack:  Second Meal: [employer] - chicken sandwich Snack: sometimes sunflower seeds Third Meal: mac and cheese + green beans Snack:  Beverages: coffee, water (128 oz/day)    NUTRITION DIAGNOSIS  NB-1.1 Food and nutrition-related knowledge deficit As related to hypertension.  As evidenced by pt verbalizes incomplete information.   NUTRITION INTERVENTION  Nutrition education (E-1) on the following topics:  Nutrition education and counseling. Pt was educated and counseled on hypertension, nutritional ways to lower blood pressure, ways to increase fiber, vitamin, and mineral intake, and ways to increase physical activity. Discussed metabolism and how to balance meals. Pt agreed with goals listed.  Handouts Provided Include  Hypertension Nutrition Therapy  Learning Style & Readiness for Change Teaching method utilized: Visual & Auditory  Demonstrated degree of understanding via: Teach Back  Barriers to learning/adherence to lifestyle change: action stage of change  Goals Established by Pt Aim to increase fiber intake with whole grain options for bread, cereal, pasta, etc.  Aim to have eat breakfast before drinking morning coffee.  Aim to have 3 meals a day. See handout as guide.  Increase movement to at least 15-20 minutes, 2 times/week walking within neighborhood while listening to audio book.    MONITORING & EVALUATION  Dietary intake, weekly physical activity.  Next Steps  Patient is to follow-up in 6 weeks.

## 2021-08-14 NOTE — Patient Instructions (Addendum)
-   Aim to increase fiber intake with whole grain options for bread, cereal, pasta, etc.   - Aim to have eat breakfast before drinking morning coffee.   - Aim to have 3 meals a day. See handout as guide.   - Increase movement to at least 15-20 minutes, 2 times/week walking within neighborhood while listening to audio book.

## 2021-09-17 ENCOUNTER — Other Ambulatory Visit: Payer: Self-pay | Admitting: *Deleted

## 2021-09-17 ENCOUNTER — Encounter: Payer: Self-pay | Admitting: Nurse Practitioner

## 2021-09-17 ENCOUNTER — Ambulatory Visit: Payer: Medicaid Other | Admitting: Nurse Practitioner

## 2021-09-17 VITALS — BP 134/84 | HR 70 | Temp 98.7°F | Ht 63.0 in | Wt 225.0 lb

## 2021-09-17 DIAGNOSIS — R7309 Other abnormal glucose: Secondary | ICD-10-CM

## 2021-09-17 DIAGNOSIS — M25552 Pain in left hip: Secondary | ICD-10-CM | POA: Diagnosis not present

## 2021-09-17 DIAGNOSIS — I10 Essential (primary) hypertension: Secondary | ICD-10-CM

## 2021-09-17 DIAGNOSIS — E7800 Pure hypercholesterolemia, unspecified: Secondary | ICD-10-CM

## 2021-09-17 DIAGNOSIS — R700 Elevated erythrocyte sedimentation rate: Secondary | ICD-10-CM

## 2021-09-17 DIAGNOSIS — M25551 Pain in right hip: Secondary | ICD-10-CM

## 2021-09-17 DIAGNOSIS — R768 Other specified abnormal immunological findings in serum: Secondary | ICD-10-CM | POA: Diagnosis not present

## 2021-09-17 DIAGNOSIS — Z6839 Body mass index (BMI) 39.0-39.9, adult: Secondary | ICD-10-CM

## 2021-09-17 MED ORDER — SPIRONOLACTONE 25 MG PO TABS: 25.0000 mg | ORAL_TABLET | Freq: Every day | ORAL | 1 refills | Status: DC

## 2021-09-17 MED ORDER — TRIAMCINOLONE ACETONIDE 40 MG/ML IJ SUSP
40.0000 mg | Freq: Once | INTRAMUSCULAR | Status: AC
  Administered 2021-09-17: 40 mg via INTRAMUSCULAR

## 2021-09-17 MED ORDER — NEBIVOLOL HCL 20 MG PO TABS: 20.0000 mg | ORAL_TABLET | Freq: Every day | ORAL | 1 refills | Status: DC

## 2021-09-17 NOTE — Patient Instructions (Addendum)
Hypertension, Adult High blood pressure (hypertension) is when the force of blood pumping through the arteries is too strong. The arteries are the blood vessels that carry blood from the heart throughout the body. Hypertension forces the heart to work harder to pump blood and may cause arteries to become narrow or stiff. Untreated or uncontrolled hypertension can lead to a heart attack, heart failure, a stroke, kidney disease, and other problems. A blood pressure reading consists of a higher number over a lower number. Ideally, your blood pressure should be below 120/80. The first ("top") number is called the systolic pressure. It is a measure of the pressure in your arteries as your heart beats. The second ("bottom") number is called the diastolic pressure. It is a measure of the pressure in your arteries as the heart relaxes. What are the causes? The exact cause of this condition is not known. There are some conditions that result in high blood pressure. What increases the risk? Certain factors may make you more likely to develop high blood pressure. Some of these risk factors are under your control, including: Smoking. Not getting enough exercise or physical activity. Being overweight. Having too much fat, sugar, calories, or salt (sodium) in your diet. Drinking too much alcohol. Other risk factors include: Having a personal history of heart disease, diabetes, high cholesterol, or kidney disease. Stress. Having a family history of high blood pressure and high cholesterol. Having obstructive sleep apnea. Age. The risk increases with age. What are the signs or symptoms? High blood pressure may not cause symptoms. Very high blood pressure (hypertensive crisis) may cause: Headache. Fast or irregular heartbeats (palpitations). Shortness of breath. Nosebleed. Nausea and vomiting. Vision changes. Severe chest pain, dizziness, and seizures. How is this diagnosed? This condition is diagnosed by  measuring your blood pressure while you are seated, with your arm resting on a flat surface, your legs uncrossed, and your feet flat on the floor. The cuff of the blood pressure monitor will be placed directly against the skin of your upper arm at the level of your heart. Blood pressure should be measured at least twice using the same arm. Certain conditions can cause a difference in blood pressure between your right and left arms. If you have a high blood pressure reading during one visit or you have normal blood pressure with other risk factors, you may be asked to: Return on a different day to have your blood pressure checked again. Monitor your blood pressure at home for 1 week or longer. If you are diagnosed with hypertension, you may have other blood or imaging tests to help your health care provider understand your overall risk for other conditions. How is this treated? This condition is treated by making healthy lifestyle changes, such as eating healthy foods, exercising more, and reducing your alcohol intake. You may be referred for counseling on a healthy diet and physical activity. Your health care provider may prescribe medicine if lifestyle changes are not enough to get your blood pressure under control and if: Your systolic blood pressure is above 130. Your diastolic blood pressure is above 80. Your personal target blood pressure may vary depending on your medical conditions, your age, and other factors. Follow these instructions at home: Eating and drinking  Eat a diet that is high in fiber and potassium, and low in sodium, added sugar, and fat. An example of this eating plan is called the DASH diet. DASH stands for Dietary Approaches to Stop Hypertension. To eat this way: Eat   plenty of fresh fruits and vegetables. Try to fill one half of your plate at each meal with fruits and vegetables. Eat whole grains, such as whole-wheat pasta, brown rice, or whole-grain bread. Fill about one  fourth of your plate with whole grains. Eat or drink low-fat dairy products, such as skim milk or low-fat yogurt. Avoid fatty cuts of meat, processed or cured meats, and poultry with skin. Fill about one fourth of your plate with lean proteins, such as fish, chicken without skin, beans, eggs, or tofu. Avoid pre-made and processed foods. These tend to be higher in sodium, added sugar, and fat. Reduce your daily sodium intake. Many people with hypertension should eat less than 1,500 mg of sodium a day. Do not drink alcohol if: Your health care provider tells you not to drink. You are pregnant, may be pregnant, or are planning to become pregnant. If you drink alcohol: Limit how much you have to: 0-1 drink a day for women. 0-2 drinks a day for men. Know how much alcohol is in your drink. In the [place] one drink equals one 12 oz bottle of beer (355 mL), one 5 oz glass of wine (148 mL), or one 1 oz glass of hard liquor (44 mL). Lifestyle  Work with your health care provider to maintain a healthy body weight or to lose weight. Ask what an ideal weight is for you. Get at least 30 minutes of exercise that causes your heart to beat faster (aerobic exercise) most days of the week. Activities may include walking, swimming, or biking. Include exercise to strengthen your muscles (resistance exercise), such as Pilates or lifting weights, as part of your weekly exercise routine. Try to do these types of exercises for 30 minutes at least 3 days a week. Do not use any products that contain nicotine or tobacco. These products include cigarettes, chewing tobacco, and vaping devices, such as e-cigarettes. If you need help quitting, ask your health care provider. Monitor your blood pressure at home as told by your health care provider. Keep all follow-up visits. This is important. Medicines Take over-the-counter and prescription medicines only as told by your health care provider. Follow directions carefully. Blood  pressure medicines must be taken as prescribed. Do not skip doses of blood pressure medicine. Doing this puts you at risk for problems and can make the medicine less effective. Ask your health care provider about side effects or reactions to medicines that you should watch for. Contact a health care provider if you: Think you are having a reaction to a medicine you are taking. Have headaches that keep coming back (recurring). Feel dizzy. Have swelling in your ankles. Have trouble with your vision. Get help right away if you: Develop a severe headache or confusion. Have unusual weakness or numbness. Feel faint. Have severe pain in your chest or abdomen. Vomit repeatedly. Have trouble breathing. These symptoms may be an emergency. Get help right away. Call 911. Do not wait to see if the symptoms will go away. Do not drive yourself to the hospital. Summary Hypertension is when the force of blood pumping through your arteries is too strong. If this condition is not controlled, it may put you at risk for serious complications. Your personal target blood pressure may vary depending on your medical conditions, your age, and other factors. For most people, a normal blood pressure is less than 120/80. Hypertension is treated with lifestyle changes, medicines, or a combination of both. Lifestyle changes include losing weight, eating a healthy,   low-sodium diet, exercising more, and limiting alcohol. This information is not intended to replace advice given to you by your health care provider. Make sure you discuss any questions you have with your health care provider. Document Revised: 11/14/2020 Document Reviewed: 11/14/2020 Elsevier Patient Education  [place] to avoid with autoimmune symptoms Grains: rice, wheat, oats, barley, rye, etc., as well as foods derived from them, such as pasta, bread, and breakfast cereals Legumes: lentils, beans, peas, peanuts, etc., as well as  foods derived from them, such as tofu, tempeh, mock meats, or peanut butter Nightshade vegetables: eggplants, peppers, potatoes, tomatoes, tomatillos, etc., as well as spices derived from nightshade vegetables, such as paprika Eggs: whole eggs, egg whites, or foods containing these ingredients Dairy: cow's, goat's, or sheep's milk, as well as foods derived from these milks, such as cream, cheese, butter, or ghee; dairy-based protein powders or other supplements should also be avoided Nuts and seeds: all nuts and seeds and foods derived from them, such as flours, butter, or oils; also includes cocoa and seed-based spices, such as coriander, cumin, anise, fennel, fenugreek, mustard, and nutmeg Certain beverages: alcohol and coffee Processed vegetable oils: canola, rapeseed, corn, cottonseed, palm kernel, safflower, soybean, or sunflower oils Refined or processed sugars: cane or beet sugar, corn syrup, brown rice syrup, and barley malt syrup; also includes sweets, soda, candy, frozen desserts, and chocolate, which may contain these ingredients Food additives and artificial sweeteners: trans fats, food colorings, emulsifiers, and thickeners, as well as artificial sweeteners, such as stevia, mannitol, and xylitol  Get tumeric tea/supplement to help with inflammation

## 2021-09-17 NOTE — Progress Notes (Signed)
I,[name],acting as a Education administrator for [employer], FNP.,have documented all relevant documentation on the behalf of [name], FNP,as directed by  [name], FNP while in the presence of [name], [place].  Subjective:     Patient ID: Sharon Bond , female    DOB: [birth year] , 56 y.o.   MRN: [id number]   Chief Complaint  Patient presents with   Hypertension    HPI  Patient presents today htn and dm. She has complaints of hip pain and stiff neck. When her husband tries to massage she has pain. She is drinking about 32 oz water a day. She has been using magnesium cream and supplements without relief. She was diagnosed with fibromyalgia years ago.   Hip Pain  The incident occurred more than 1 week ago (3 weeks). The pain is present in the right hip and left hip. The quality of the pain is described as aching, burning and cramping. The pain is at a severity of 7/10. The symptoms are aggravated by movement. She has tried NSAIDs for the symptoms.     Past Medical History:  Diagnosis Date   Allergy    pollen   Anxiety 2022   Eczema    GERD (gastroesophageal reflux disease)    Hypertension    IUD (intrauterine device) in place 2005   removed 2015   Mixed incontinence    Seasonal allergic rhinitis    Sleep apnea 10/2020   11/16/20--has not rec'd cpap yet   Wears glasses      Family History  Problem Relation Age of Onset   Diabetes Mother    Hypertension Mother    Healthy Father    Diabetes Brother    Heart disease Maternal Aunt    Cancer Neg Hx    Stroke Neg Hx    Sleep apnea Neg Hx    Colon cancer Neg Hx    Colon polyps Neg Hx    Esophageal cancer Neg Hx    Rectal cancer Neg Hx    Stomach cancer Neg Hx      Current Outpatient Medications:    albuterol (VENTOLIN HFA) 108 (90 Base) MCG/ACT inhaler, Inhale 2 puffs into the lungs every 6 (six) hours as needed for wheezing or shortness of breath., Disp: 8 g, Rfl: 2   busPIRone (BUSPAR) 10 MG tablet, TAKE 1 TABLET  BY MOUTH EVERY DAY, Disp: 90 tablet, Rfl: 2   EPINEPHrine 0.3 mg/0.3 mL IJ SOAJ injection, Inject 0.3 mg into the muscle as needed for anaphylaxis., Disp: 1 each, Rfl: 1   hydrochlorothiazide (HYDRODIURIL) 25 MG tablet, TAKE 1 TABLET (25 MG TOTAL) BY MOUTH DAILY., Disp: 90 tablet, Rfl: 0   Lactobacillus (PROBIOTIC ACIDOPHILUS PO), Take by mouth daily with breakfast., Disp: , Rfl:    methocarbamol (ROBAXIN) 500 MG tablet, Take 1 tablet (500 mg total) by mouth at bedtime as needed for muscle spasms. (Patient not taking: Reported on 08/14/2021), Disp: 10 tablet, Rfl: 0   Nebivolol HCl (BYSTOLIC) 20 MG TABS, Take 1 tablet (20 mg total) by mouth daily., Disp: 90 tablet, Rfl: 1   omeprazole (PRILOSEC OTC) 20 MG tablet, Take 1 tablet (20 mg total) by mouth daily., Disp: 30 tablet, Rfl: 1   spironolactone (ALDACTONE) 25 MG tablet, Take 1 tablet (25 mg total) by mouth daily., Disp: 90 tablet, Rfl: 1   traZODone (DESYREL) 50 MG tablet, TAKE 0.5-1 TABLETS BY MOUTH AT BEDTIME AS NEEDED FOR SLEEP., Disp: 90 tablet, Rfl: 1   triamcinolone cream (  KENALOG) 0.1 %, Apply 1 application. topically as needed. Pt using at least 4 times a day, Disp: 30 g, Rfl: 2   valsartan (DIOVAN) 320 MG tablet, Take 1 tablet (320 mg total) by mouth daily., Disp: 90 tablet, Rfl: 3   Allergies  Allergen Reactions   Penicillins Swelling, Other (See Comments) and Rash    Facial swelling Facial swelling   Olmesartan     "cough"   Amlodipine Cough     Review of Systems  Constitutional: Negative.   Respiratory: Negative.    Cardiovascular: Negative.   Gastrointestinal: Negative.   Neurological: Negative.   Psychiatric/Behavioral: Negative.       Today's Vitals   09/17/21 1602  BP: 134/84  Pulse: 70  Temp: 98.7 F (37.1 C)  TempSrc: Oral  Weight: 225 lb (102.1 kg)  Height: 5' 3"  (1.6 m)  PainSc: 7    Body mass index is 39.86 kg/m.  Wt Readings from Last 3 Encounters:  09/17/21 225 lb (102.1 kg)  05/24/21 227 lb (103  kg)  05/09/21 227 lb (103 kg)    Objective:  Physical Exam Vitals reviewed.  Constitutional:      General: She is not in acute distress.    Appearance: Normal appearance. She is obese.  Cardiovascular:     Rate and Rhythm: Normal rate and regular rhythm.     Pulses: Normal pulses.     Heart sounds: Normal heart sounds. No murmur heard. Pulmonary:     Effort: Pulmonary effort is normal. No respiratory distress.     Breath sounds: Normal breath sounds. No wheezing.  Skin:    Capillary Refill: Capillary refill takes less than 2 seconds.  Neurological:     General: No focal deficit present.     Mental Status: She is alert and oriented to person, place, and time.  Psychiatric:        Mood and Affect: Mood normal.        Behavior: Behavior normal.        Thought Content: Thought content normal.        Judgment: Judgment normal.         Assessment And Plan:     1. Essential hypertension Comments: She had ran out of her medication 2 days ago will be picking up today. Blood pressure is fairly controlled. Continue current medications - BMP8+EGFR  2. Elevated cholesterol Comments: Stable, will check current medications.  - Lipid panel  3. Abnormal glucose Comments: Stable, diet controlled. Continue following a low sugar and carb diet - Hemoglobin A1c  4. Bilateral hip pain Comments: Will refer to Rheumatology due to elevated Sed rate and positive ANA - Ambulatory referral to Rheumatology - triamcinolone acetonide (KENALOG-40) injection 40 mg  5. Class 2 severe obesity due to excess calories with serious comorbidity and body mass index (BMI) of 39.0 to 39.9 in adult [facility])  She is encouraged to strive for BMI less than 30 to decrease cardiac risk. Advised to aim for at least 150 minutes of exercise per week.  6. Elevated sed rate Comments: Will refer to Rheumatology due to having bilateral hip pain as well and a positive ANA - triamcinolone acetonide (KENALOG-40) injection  40 mg  7. Positive ANA (antinuclear antibody) - triamcinolone acetonide (KENALOG-40) injection 40 mg   Patient was given opportunity to ask questions. Patient verbalized understanding of the plan and was able to repeat key elements of the plan. All questions were answered to their satisfaction.  [name], FNP  Teola Bradley, FNP, have reviewed all documentation for this visit. The documentation on 09/17/21 for the exam, diagnosis, procedures, and orders are all accurate and complete.   IF YOU HAVE BEEN REFERRED TO A SPECIALIST, IT MAY TAKE 1-2 WEEKS TO SCHEDULE/PROCESS THE REFERRAL. IF YOU HAVE NOT HEARD FROM US/SPECIALIST IN TWO WEEKS, PLEASE GIVE [place] A CALL AT [phone] X [phone].   THE PATIENT IS ENCOURAGED TO PRACTICE SOCIAL DISTANCING DUE TO THE COVID-19 PANDEMIC.

## 2021-09-18 LAB — BMP8+EGFR
BUN/Creatinine Ratio: 13 (ref 9–23)
BUN: 10 mg/dL (ref 6–24)
CO2: 23 mmol/L (ref 20–29)
Calcium: 9.5 mg/dL (ref 8.7–10.2)
Chloride: 106 mmol/L (ref 96–106)
Creatinine, Ser: 0.77 mg/dL (ref 0.57–1.00)
Glucose: 102 mg/dL — ABNORMAL HIGH (ref 70–99)
Potassium: 3.9 mmol/L (ref 3.5–5.2)
Sodium: 144 mmol/L (ref 134–144)
eGFR: 91 mL/min/{1.73_m2} (ref >59)

## 2021-09-18 LAB — LIPID PANEL
Chol/HDL Ratio: 3.4 ratio (ref 0.0–4.4)
Cholesterol, Total: 178 mg/dL (ref 100–199)
HDL: 53 mg/dL (ref >39)
LDL Chol Calc (NIH): 105 mg/dL — ABNORMAL HIGH (ref 0–99)
Triglycerides: 114 mg/dL (ref 0–149)
VLDL Cholesterol Cal: 20 mg/dL (ref 5–40)

## 2021-09-18 LAB — HEMOGLOBIN A1C
Est. average glucose Bld gHb Est-mCnc: 126 mg/dL
Hgb A1c MFr Bld: 6 % — ABNORMAL HIGH (ref 4.8–5.6)

## 2021-10-02 ENCOUNTER — Ambulatory Visit: Payer: Medicaid Other | Admitting: Registered"

## 2021-10-03 ENCOUNTER — Ambulatory Visit (INDEPENDENT_AMBULATORY_CARE_PROVIDER_SITE_OTHER): Payer: Medicaid Other

## 2021-10-03 ENCOUNTER — Encounter (HOSPITAL_COMMUNITY): Payer: Self-pay

## 2021-10-03 ENCOUNTER — Ambulatory Visit (HOSPITAL_COMMUNITY)
Admission: EM | Admit: 2021-10-03 | Discharge: 2021-10-03 | Disposition: A | Discharge status: Home / Self Care | Payer: Medicaid Other | Attending: Physician Assistant | Admitting: Physician Assistant

## 2021-10-03 DIAGNOSIS — M25531 Pain in right wrist: Secondary | ICD-10-CM | POA: Diagnosis not present

## 2021-10-03 DIAGNOSIS — W19XXXA Unspecified fall, initial encounter: Secondary | ICD-10-CM

## 2021-10-03 DIAGNOSIS — M25551 Pain in right hip: Secondary | ICD-10-CM | POA: Diagnosis not present

## 2021-10-03 DIAGNOSIS — M25552 Pain in left hip: Secondary | ICD-10-CM

## 2021-10-03 DIAGNOSIS — M25562 Pain in left knee: Secondary | ICD-10-CM

## 2021-10-03 MED ORDER — PREDNISONE 20 MG PO TABS: 40.0000 mg | ORAL_TABLET | Freq: Every day | ORAL | 0 refills | Status: AC

## 2021-10-03 MED ORDER — CYCLOBENZAPRINE HCL 10 MG PO TABS: 10.0000 mg | ORAL_TABLET | Freq: Two times a day (BID) | ORAL | 0 refills | Status: DC | PRN

## 2021-10-03 NOTE — ED Provider Notes (Signed)
[place]    CSN: [id number] Arrival date & time: 10/03/21  1613      History   Chief Complaint Chief Complaint  Patient presents with   Fall    HPI Sharon Bond is a 56 y.o. female.   Patient here today for evaluation of left knee pain and right wrist pain after she fell in the parking lot a week ago.  She reports that she felt as if her hips gave out and she fell onto her left knee and outstretched right hand.  She notes hip still feel as if they are clicking when she walks and she has had some muscle spasms in her legs.  She has left knee pain that has continued to feel like a burning sensation more anteriorly since fall.  She also notes pain in right wrist with movement.  She has been taking Motrin without significant relief of symptoms.  She has had referral to rheumatologist but is not due to see them until March.  She denies any numbness.  She did not have any loss of consciousness with fall.  The history is provided by the patient.  Fall    Past Medical History:  Diagnosis Date   Allergy    pollen   Anxiety 2022   Eczema    GERD (gastroesophageal reflux disease)    Hypertension    IUD (intrauterine device) in place 2005   removed 2015   Mixed incontinence    Seasonal allergic rhinitis    Sleep apnea 10/2020   11/16/20--has not rec'd cpap yet   Wears glasses     Patient Active Problem List   Diagnosis Date Noted   Obstructive sleep apnea hypopnea, moderate 10/03/2020   Insomnia secondary to chronic pain 06/28/2020   Psychophysiological insomnia 06/28/2020   Class 3 severe obesity due to excess calories without serious comorbidity with body mass index (BMI) of 40.0 to 44.9 in adult ([place]) 06/28/2020   Snoring 06/28/2020   Pain in right arm 05/31/2019   Dysmenorrhea 08/12/2017   Dyspareunia in female 08/12/2017   Fibroid uterus 08/12/2017   Menorrhagia with regular cycle 08/12/2017   Intrinsic eczema 02/27/2017   Obesity, Class III, BMI  40-49.9 (morbid obesity) ([place]) 02/13/2017   Elevated blood pressure 10/07/2012   Hypokalemia 03/20/2006   ANXIETY 03/20/2006   Essential hypertension 03/20/2006   Rhinitis, allergic 03/20/2006   GASTROESOPHAGEAL REFLUX, NO ESOPHAGITIS 03/20/2006   FIBROMYALGIA, FIBROMYOSITIS 03/20/2006   MUSCLE CRAMPS NOS 03/20/2006    Past Surgical History:  Procedure Laterality Date   DILATATION & CURETTAGE/HYSTEROSCOPY WITH TRUECLEAR N/A 01/08/2013   Procedure: DILATATION & CURETTAGE/HYSTEROSCOPY WITH TRUCLEAR, polypectomy;  Surgeon: Claiborne Billings A. Pamala Hurry, MD;  Location: [facility];  Service: Gynecology;  Laterality: N/A;   LAPAROSCOPIC TUBAL LIGATION Bilateral 01/08/2013   Procedure: DIAGNOSTIC LAPAROSCOPY Bilateral Salpingectomy;  Surgeon: Claiborne Billings A. Pamala Hurry, MD;  Location: [facility];  Service: Gynecology;  Laterality: Bilateral;   PARTIAL HYSTERECTOMY  2019   WISDOM TOOTH EXTRACTION      OB History     Gravida  4   Para  4   Term  3   Preterm  1   AB      Living  2      SAB      IAB      Ectopic      Multiple      Live Births  2            Home Medications  Prior to Admission medications   Medication Sig Start Date End Date Taking? Authorizing Provider  cyclobenzaprine (FLEXERIL) 10 MG tablet Take 1 tablet (10 mg total) by mouth 2 (two) times daily as needed for muscle spasms. 10/03/21  Yes Francene Finders, PA-C  predniSONE (DELTASONE) 20 MG tablet Take 2 tablets (40 mg total) by mouth daily with breakfast for 5 days. 10/03/21 10/08/21 Yes Francene Finders, PA-C  albuterol (VENTOLIN HFA) 108 (90 Base) MCG/ACT inhaler Inhale 2 puffs into the lungs every 6 (six) hours as needed for wheezing or shortness of breath. 03/23/20   [name], FNP  busPIRone (BUSPAR) 10 MG tablet TAKE 1 TABLET BY MOUTH EVERY DAY 07/18/21   [name], FNP  EPINEPHrine 0.3 mg/0.3 mL IJ SOAJ injection Inject 0.3 mg into the muscle as needed for anaphylaxis. 03/23/20   [name], FNP   hydrochlorothiazide (HYDRODIURIL) 25 MG tablet TAKE 1 TABLET (25 MG TOTAL) BY MOUTH DAILY. 12/06/20   [name], FNP  Lactobacillus (PROBIOTIC ACIDOPHILUS PO) Take by mouth daily with breakfast.    [provider]  Nebivolol HCl (BYSTOLIC) 20 MG TABS Take 1 tablet (20 mg total) by mouth daily. 09/17/21   Freada Bergeron, MD  omeprazole (PRILOSEC OTC) 20 MG tablet Take 1 tablet (20 mg total) by mouth daily. 08/30/20   [name], FNP  spironolactone (ALDACTONE) 25 MG tablet Take 1 tablet (25 mg total) by mouth daily. 09/17/21   Freada Bergeron, MD  traZODone (DESYREL) 50 MG tablet TAKE 0.5-1 TABLETS BY MOUTH AT BEDTIME AS NEEDED FOR SLEEP. 07/20/20   Dohmeier, Asencion Partridge, MD  triamcinolone cream (KENALOG) 0.1 % Apply 1 application. topically as needed. Pt using at least 4 times a day 05/09/21   [name], FNP  valsartan (DIOVAN) 320 MG tablet Take 1 tablet (320 mg total) by mouth daily. 03/01/21   Freada Bergeron, MD    Family History Family History  Problem Relation Age of Onset   Diabetes Mother    Hypertension Mother    Healthy Father    Diabetes Brother    Heart disease Maternal Aunt    Cancer Neg Hx    Stroke Neg Hx    Sleep apnea Neg Hx    Colon cancer Neg Hx    Colon polyps Neg Hx    Esophageal cancer Neg Hx    Rectal cancer Neg Hx    Stomach cancer Neg Hx     Social History Social History   Tobacco Use   Smoking status: Never   Smokeless tobacco: Never  Vaping Use   Vaping Use: Never used  Substance Use Topics   Alcohol use: Yes    Comment: occasionally during holidays, wine or beer   Drug use: Never     Allergies   Penicillins, Olmesartan, and Amlodipine   Review of Systems Review of Systems  Constitutional:  Negative for chills and fever.  Eyes:  Negative for discharge and redness.  Gastrointestinal:  Negative for nausea and vomiting.  Musculoskeletal:  Positive for arthralgias, gait problem and myalgias.  Skin:  Negative for  color change and wound.  Neurological:  Negative for numbness.     Physical Exam Triage Vital Signs ED Triage Vitals  Enc Vitals Group     BP      Pulse      Resp      Temp      Temp src      SpO2      Weight  Height      Head Circumference      Peak Flow      Pain Score      Pain Loc      Pain Edu?      Excl. in [place]?    No data found.  Updated Vital Signs BP (!) 145/82 (BP Location: Right Arm)   Pulse 68   Temp 98.9 F (37.2 C) (Oral)   Resp 16   LMP 11/28/2012   SpO2 100%      Physical Exam Vitals and nursing note reviewed.  Constitutional:      General: She is not in acute distress.    Appearance: Normal appearance. She is not ill-appearing.  HENT:     Head: Normocephalic and atraumatic.  Eyes:     Conjunctiva/sclera: Conjunctivae normal.  Cardiovascular:     Rate and Rhythm: Normal rate.  Pulmonary:     Effort: Pulmonary effort is normal.  Musculoskeletal:     Comments: Minimal swelling noted to anterior left knee with diffuse tenderness to palpation anteriorly.  No erythema or bruising noted.  Patient is able to bear weight but has some pain with same.  Full range of motion of left elbow.  Minimally decreased range of motion of right wrist due to pain.  Pain primarily noted at radial aspect of wrist.  Skin:    General: Skin is warm and dry.     Comments: Normal cap refill to right fingers  Neurological:     Mental Status: She is alert.     Comments: Gross sensation intact to right fingers  Psychiatric:        Mood and Affect: Mood normal.        Behavior: Behavior normal.        Thought Content: Thought content normal.      UC Treatments / Results  Labs (all labs ordered are listed, but only abnormal results are displayed) Labs Reviewed - No data to display  EKG   Radiology DG Knee AP/LAT W/[place] Left  Result Date: 10/03/2021 CLINICAL DATA:  Fall EXAM: LEFT KNEE 3 VIEWS COMPARISON:  None Available. FINDINGS: No fracture or  malalignment. Mild patellofemoral degenerative change. No significant knee effusion. IMPRESSION: No acute osseous abnormality Electronically Signed   By: Donavan Foil M.D.   On: 10/03/2021 18:15   DG Wrist Complete Right  Result Date: 10/03/2021 CLINICAL DATA:  Fall EXAM: RIGHT WRIST - COMPLETE 3+ VIEW COMPARISON:  None Available. FINDINGS: There is no evidence of fracture or dislocation. There is no evidence of arthropathy or other focal bone abnormality. Soft tissues are unremarkable. IMPRESSION: Negative. Electronically Signed   By: Donavan Foil M.D.   On: 10/03/2021 18:14    Procedures Procedures (including critical care time)  Medications Ordered in UC Medications - No data to display  Initial Impression / Assessment and Plan / UC Course  I have reviewed the triage vital signs and the nursing notes.  Pertinent labs & imaging results that were available during my care of the patient were reviewed by me and considered in my medical decision making (see chart for details).    X-rays ordered to rule out acute fracture given mechanism of injury.  X-rays without fracture.  Will treat with short course of steroids orally as well as muscle relaxer.  Advised that muscle relaxer may cause drowsiness and use with caution.  Advised to avoid ibuprofen or Aleve while using steroids but okay to continue Tylenol if needed for pain.  Contact information provided for orthopedic follow-up as I suspect she may need more significant treatment for suspected arthritis of hips and other joints.  Encouraged follow-up with any further concerns.  Final Clinical Impressions(s) / UC Diagnoses   Final diagnoses:  Fall, initial encounter  Bilateral hip pain  Acute pain of left knee  Right wrist pain   Discharge Instructions   None    ED Prescriptions     Medication Sig Dispense Auth. Provider   predniSONE (DELTASONE) 20 MG tablet Take 2 tablets (40 mg total) by mouth daily with breakfast for 5 days. 10  tablet Ewell Poe F, PA-C   cyclobenzaprine (FLEXERIL) 10 MG tablet Take 1 tablet (10 mg total) by mouth 2 (two) times daily as needed for muscle spasms. 20 tablet Francene Finders, PA-C      PDMP not reviewed this encounter.   Francene Finders, PA-C 10/03/21 1835

## 2021-10-03 NOTE — ED Triage Notes (Signed)
Pt states she fell last week in the parking lot c/o left knee pain and bilateral hip pain. States she has chronic hip pain and feels like she fell because her hips went out on her.  Has been taking Motrin at home with no relief.

## 2021-10-12 DIAGNOSIS — M25562 Pain in left knee: Secondary | ICD-10-CM | POA: Insufficient documentation

## 2021-11-08 ENCOUNTER — Ambulatory Visit: Payer: Medicaid Other | Admitting: Nurse Practitioner

## 2021-11-19 ENCOUNTER — Other Ambulatory Visit: Payer: Self-pay | Admitting: Neurology

## 2021-11-20 ENCOUNTER — Other Ambulatory Visit: Payer: Self-pay | Admitting: *Deleted

## 2021-11-20 DIAGNOSIS — R072 Precordial pain: Secondary | ICD-10-CM

## 2021-11-28 ENCOUNTER — Other Ambulatory Visit: Payer: Self-pay

## 2021-11-28 DIAGNOSIS — R072 Precordial pain: Secondary | ICD-10-CM

## 2021-11-28 MED ORDER — VALSARTAN 320 MG PO TABS: 320.0000 mg | ORAL_TABLET | Freq: Every day | ORAL | 0 refills | Status: DC

## 2021-11-28 NOTE — Telephone Encounter (Signed)
Pt's medication was sent to pt's pharmacy as requested. Confirmation received.  °

## 2021-12-03 NOTE — Progress Notes (Unsigned)
Cardiology Office Note:    Date:  12/03/2021   ID:  Sharon Bond, DOB 1965-07-18, MRN [id number]  PCP:  [name], [place]  Cardiologist:  None  Advanced Practice Provider:  No care team member to display Electrophysiologist:  None    Referring MD: [name], FNP    History of Present Illness:    Sharon Bond is a 56 y.o. female with a hx of HTN who presents to clinic for follow-up.  Patient saw [name], FNP on 03/28/20 for cough, DOE, and palpitations. BNP was checked and was normal at 6.5. CBC and BNP normal. She was referred to Cardiology for further evaluation.  Was seen in clinic on 04/06/20 where she was complaining of chest heaviness, dyspnea on exertion, and elevated blood pressures. Coronary CTA 05/11/20 with no obstructive CAD, calcium score 0. TTE 05/04/20 with LVEF 60-65%, mild LVH, normal diastolic function, normal RV, no significant valve disease.   Last seen in clinic on 02/2021 where she was doing well from a CV standpoint.   Today, ***  Past Medical History:  Diagnosis Date   Allergy    pollen   Anxiety 2022   Eczema    GERD (gastroesophageal reflux disease)    Hypertension    IUD (intrauterine device) in place 2005   removed 2015   Mixed incontinence    Seasonal allergic rhinitis    Sleep apnea 10/2020   11/16/20--has not rec'd cpap yet   Wears glasses     Past Surgical History:  Procedure Laterality Date   DILATATION & CURETTAGE/HYSTEROSCOPY WITH TRUECLEAR N/A 01/08/2013   Procedure: DILATATION & CURETTAGE/HYSTEROSCOPY WITH TRUCLEAR, polypectomy;  Surgeon: Claiborne Billings A. Pamala Hurry, MD;  Location: [facility];  Service: Gynecology;  Laterality: N/A;   LAPAROSCOPIC TUBAL LIGATION Bilateral 01/08/2013   Procedure: DIAGNOSTIC LAPAROSCOPY Bilateral Salpingectomy;  Surgeon: Claiborne Billings A. Pamala Hurry, MD;  Location: [facility];  Service: Gynecology;  Laterality: Bilateral;   PARTIAL HYSTERECTOMY  2019   WISDOM TOOTH EXTRACTION       Current Medications: No outpatient medications have been marked as taking for the 12/06/21 encounter (Appointment) with Freada Bergeron, MD.     Allergies:   Penicillins, Olmesartan, and Amlodipine   Social History   Socioeconomic History   Marital status: Married    Spouse name: Not on file   Number of children: Not on file   Years of education: Not on file   Highest education level: Not on file  Occupational History   Not on file  Tobacco Use   Smoking status: Never   Smokeless tobacco: Never  Vaping Use   Vaping Use: Never used  Substance and Sexual Activity   Alcohol use: Yes    Comment: occasionally during holidays, wine or beer   Drug use: Never   Sexual activity: Yes    Partners: Male    Birth control/protection: Surgical  Other Topics Concern   Not on file  Social History Narrative   married, 83 yo son, 92 yo, 67 yo, exercise - some with walking   Lives at home with spouse and 65 y.o. son   Left handed   Caffeine: decaf coffee on the weekend    Social Determinants of Health   Financial Resource Strain: Not on file  Food Insecurity: No Food Insecurity (08/14/2021)   Hunger Vital Sign    Worried About Running Out of Food in the Last Year: Never true    Ran Out of Food  in the Last Year: Never true  Transportation Needs: Not on file  Physical Activity: Not on file  Stress: Not on file  Social Connections: Not on file     Family History: The patient's family history includes Diabetes in her brother and mother; Healthy in her father; Heart disease in her maternal aunt; Hypertension in her mother. There is no history of Cancer, Stroke, Sleep apnea, Colon cancer, Colon polyps, Esophageal cancer, Rectal cancer, or Stomach cancer.  ROS:   Please see the history of present illness.    Review of Systems  Constitutional:  Negative for chills, fever and malaise/fatigue.  Eyes:  Negative for blurred vision and redness.  Respiratory:  Negative for shortness  of breath.   Cardiovascular:  Negative for chest pain, palpitations, orthopnea, claudication, leg swelling and PND.  Gastrointestinal:  Negative for blood in stool, nausea and vomiting.  Genitourinary:  Negative for dysuria and flank pain.  Musculoskeletal:  Negative for falls.  Neurological:  Positive for headaches. Negative for dizziness and loss of consciousness.  Endo/Heme/Allergies:  Negative for polydipsia.  Psychiatric/Behavioral:  Positive for depression.     EKGs/Labs/Other Studies Reviewed:    The following studies were reviewed today: Coronary CTA 05/11/20: FINDINGS: Non-cardiac: See separate report from [facility].   No LA appendage thrombus. Pulmonary veins drain normally to the left atrium.   Calcium Score: 0 Agatston units.   Coronary Arteries: Right dominant. The LAD and LCx have separate origins from the left cusp.   LM: No LM, separate origins of LAD and LCx from left cusp.   LAD system: Separate origin off left cusp.  No plaque or stenosis.   Circumflex system: Separate origin off left cusp, early acute angle turn. No plaque or stenosis.   RCA system: No plaque or stenosis.   IMPRESSION: 1. Coronary artery calcium score 0 Agatston units. This suggests low risk for future cardiac events.   2.  No significant coronary disease noted.   3.  Separate origins of LCx and LAD from left cusp.   TTE 05/04/20: IMPRESSIONS     1. Left ventricular ejection fraction, by estimation, is 60 to 65%. The  left ventricle has normal function. The left ventricle has no regional  wall motion abnormalities. There is mild concentric left ventricular  hypertrophy. Left ventricular diastolic  parameters were normal.   2. Right ventricular systolic function is normal. The right ventricular  size is normal.   3. The mitral valve is normal in structure. Trivial mitral valve  regurgitation.   4. The aortic valve is tricuspid. Aortic valve regurgitation is not   visualized. No aortic stenosis is present.   5. The inferior vena cava is normal in size with greater than 50%  respiratory variability, suggesting right atrial pressure of 3 mmHg.   EKG:  No new tracing today  Recent Labs: 05/09/2021: Hemoglobin 13.8; Platelets 190 09/17/2021: BUN 10; Creatinine, Ser 0.77; Potassium 3.9; Sodium 144  Recent Lipid Panel    Component Value Date/Time   CHOL 178 09/17/2021 1655   TRIG 114 09/17/2021 1655   HDL 53 09/17/2021 1655   CHOLHDL 3.4 09/17/2021 1655   CHOLHDL 3.5 07/18/2014 0001   VLDL 18 07/18/2014 0001   LDLCALC 105 (H) 09/17/2021 1655     Physical Exam:    VS:  LMP 11/28/2012     Wt Readings from Last 3 Encounters:  09/17/21 225 lb (102.1 kg)  05/24/21 227 lb (103 kg)  05/09/21 227 lb (103 kg)  GEN:  Comfortable, NAD HEENT: Normal NECK: No JVD; No carotid bruits CARDIAC: RRR, 1/6 systolic murmur RESPIRATORY:  Clear to auscultation without rales, wheezing or rhonchi  ABDOMEN: Soft, non-tender, non-distended MUSCULOSKELETAL:  Warm, no edema SKIN: Warm and dry NEUROLOGIC:  Alert and oriented x 3 PSYCHIATRIC:  Normal affect   ASSESSMENT:    No diagnosis found.   PLAN:    In order of problems listed above:  #DOE: #Chest Pressure: Patient with worsening chest pressure and shortness of breath on exertion likely due to uncontrolled HTN. Coronary CTA with no obstructive CAD. TTE with normal LVEF, no significant valve disease. Now improved with better control of BP. -Coronary CTA without obstructive disease -TTE with normal LVEF, no significant valve disease -Manage HTN as below  #HTN: *** -Continue HCTZ '25mg'$  daily -Continue valsartan '235mg'$  daily -Continue nebivolol  '20mg'$  daily -Continue spironolactone '25mg'$  daily -Did not tolerate amlodipine or lisinopril in the past -Adherent to low Na diet  #LE edema: Improved. Echo with normal LVEF 60-65%, no significant valve disease. -Continue HCTZ '25mg'$  daily -Leg elevation  at the end of the day  #Obesity: BMI 40. Trying to work on diet and exercise to lose weight. -Continue diet and lifestyle modifications   Medication Adjustments/Labs and Tests Ordered: Current medicines are reviewed at length with the patient today.  Concerns regarding medicines are outlined above.  No orders of the defined types were placed in this encounter.   No orders of the defined types were placed in this encounter.   There are no Patient Instructions on file for this visit.    Signed, Freada Bergeron, MD  12/03/2021 2:21 PM    [place]

## 2021-12-06 ENCOUNTER — Ambulatory Visit (INDEPENDENT_AMBULATORY_CARE_PROVIDER_SITE_OTHER): Payer: Medicaid Other

## 2021-12-06 ENCOUNTER — Ambulatory Visit: Payer: Medicaid Other | Attending: Cardiology | Admitting: Cardiology

## 2021-12-06 VITALS — BP 110/78 | HR 65 | Ht 60.5 in | Wt 227.6 lb

## 2021-12-06 DIAGNOSIS — R0609 Other forms of dyspnea: Secondary | ICD-10-CM

## 2021-12-06 DIAGNOSIS — R002 Palpitations: Secondary | ICD-10-CM

## 2021-12-06 DIAGNOSIS — R600 Localized edema: Secondary | ICD-10-CM

## 2021-12-06 DIAGNOSIS — I10 Essential (primary) hypertension: Secondary | ICD-10-CM | POA: Diagnosis not present

## 2021-12-06 DIAGNOSIS — R072 Precordial pain: Secondary | ICD-10-CM

## 2021-12-06 NOTE — Patient Instructions (Signed)
Medication Instructions:   *If you need a refill on your cardiac medications before your next appointment, please call your pharmacy*   Lab Work:  If you have labs (blood work) drawn today and your tests are completely normal, you will receive your results only by: [place] (if you have MyChart) OR A paper copy in the mail If you have any lab test that is abnormal or we need to change your treatment, we will call you to review the results.   Testing/Procedures: [place] [place]- Long Term Monitor Instructions  Your physician has requested you wear a ZIO patch monitor for 3 days.  This is a single patch monitor. [employer] supplies one patch monitor per enrollment. Additional stickers are not available. Please do not apply patch if you will be having a Nuclear Stress Test,  Echocardiogram, Cardiac CT, MRI, or Chest Xray during the period you would be wearing the  monitor. The patch cannot be worn during these tests. You cannot remove and re-apply the  ZIO XT patch monitor.  Your ZIO patch monitor will be mailed 3 day USPS to your address on file. It may take 3-5 days  to receive your monitor after you have been enrolled.  Once you have received your monitor, please review the enclosed instructions. Your monitor  has already been registered assigning a specific monitor serial # to you.  Billing and Patient Assistance Program Information  We have supplied [employer] with any of your insurance information on file for billing purposes. [employer] offers a sliding scale Patient Assistance Program for patients that do not have  insurance, or whose insurance does not completely cover the cost of the ZIO monitor.  You must apply for the Patient Assistance Program to qualify for this discounted rate.  To apply, please call [employer] at [phone], select option 4, select option 2, ask to apply for  Patient Assistance Program. Theodore Demark will ask your household income, and how many people  are in your  household. They will quote your out-of-pocket cost based on that information.  [employer] will also be able to set up a 22-month interest-free payment plan if needed.  Applying the monitor   Shave hair from upper left chest.  Hold abrader disc by orange tab. Rub abrader in 40 strokes over the upper left chest as  indicated in your monitor instructions.  Clean area with 4 enclosed alcohol pads. Let dry.  Apply patch as indicated in monitor instructions. Patch will be placed under collarbone on left  side of chest with arrow pointing upward.  Rub patch adhesive wings for 2 minutes. Remove white label marked "1". Remove the white  label marked "2". Rub patch adhesive wings for 2 additional minutes.  While looking in a mirror, press and release button in center of patch. A small green light will  flash 3-4 times. This will be your only indicator that the monitor has been turned on.  Do not shower for the first 24 hours. You may shower after the first 24 hours.  Press the button if you feel a symptom. You will hear a small click. Record Date, Time and  Symptom in the Patient Logbook.  When you are ready to remove the patch, follow instructions on the last 2 pages of Patient  Logbook. Stick patch monitor onto the last page of Patient Logbook.  Place Patient Logbook in the blue and white box. Use locking tab on box and tape box closed  securely. The blue and white box has prepaid  postage on it. Please place it in the mailbox as  soon as possible. Your physician should have your test results approximately 7 days after the  monitor has been mailed back to [facility].  Call [place] at [phone] if you have questions regarding  your ZIO XT patch monitor. Call them immediately if you see an orange light blinking on your  monitor.  If your monitor falls off in less than 4 days, contact our Monitor department at [phone].  If your monitor becomes loose or falls off after  4 days call [employer] at [phone] for  suggestions on securing your monitor    Follow-Up: At [facility], you and your health needs are our priority.  As part of our continuing mission to provide you with exceptional heart care, we have created designated Provider Care Teams.  These Care Teams include your primary Cardiologist (physician) and Advanced Practice Providers (APPs -  Physician Assistants and Nurse Practitioners) who all work together to provide you with the care you need, when you need it.  We recommend signing up for the patient portal called "MyChart".  Sign up information is provided on this After Visit Summary.  MyChart is used to connect with patients for Virtual Visits (Telemedicine).  Patients are able to view lab/test results, encounter notes, upcoming appointments, etc.  Non-urgent messages can be sent to your provider as well.   To learn more about what you can do with MyChart, go to [email or web address].    Your next appointment:   6 months   The format for your next appointment:   In Person  Provider:   Dr Gwyndolyn Kaufman     Other Instructions [place]

## 2021-12-06 NOTE — Addendum Note (Signed)
Addended by: Sharee Holster R on: 12/06/2021 11:06 AM   Modules accepted: Orders

## 2021-12-06 NOTE — Progress Notes (Signed)
Cardiology Office Note:    Date:  12/06/2021   ID:  Sharon Bond, DOB Mar 23, 1965, MRN [id number]  PCP:  Sharon Bond, [place]  Cardiologist:  None  Advanced Practice Provider:  No care team member to display Electrophysiologist:  None    Referring MD: Sharon Brine, FNP    History of Present Illness:    Sharon Bond is a 56 y.o. female with a hx of HTN who presents to clinic for follow-up.  Patient saw Sharon Brine, FNP on 03/28/20 for cough, DOE, and palpitations. BNP was checked and was normal at 6.5. CBC and BNP normal. She was referred to Cardiology for further evaluation.  Was seen in clinic on 04/06/20 where she was complaining of chest heaviness, dyspnea on exertion, and elevated blood pressures. Coronary CTA 05/11/20 with no obstructive CAD, calcium score 0. TTE 05/04/20 with LVEF 60-65%, mild LVH, normal diastolic function, normal RV, no significant valve disease.   Last seen in clinic on 02/2021 where she was doing well from a CV standpoint.   Today, she reports that she continues to have SOB. She will start to feel winded pretty quickly when she is walking around the store.  No associated chest pain. Has not been using her albuterol inhalers.  She reports she continues to have palpitations. It has only occurred when she is laying down to rest and go to bed. She is not dizzy when it occurs. It has been occurring every night when she is going to bed.   Her weight loss has been going well. She lost 10 pounds and has kept it off for about 2 months now.   She has not been able to monitor her blood pressure at home due to her busy work schedule. Well controlled in the office today.  She denies any peripheral edema. No lightheadedness, headaches, syncope, orthopnea, or PND.   Past Medical History:  Diagnosis Date   Allergy    pollen   Anxiety 2022   Eczema    GERD (gastroesophageal reflux disease)    Hypertension    IUD (intrauterine  device) in place 2005   removed 2015   Mixed incontinence    Seasonal allergic rhinitis    Sleep apnea 10/2020   11/16/20--has not rec'd cpap yet   Wears glasses     Past Surgical History:  Procedure Laterality Date   DILATATION & CURETTAGE/HYSTEROSCOPY WITH TRUECLEAR N/A 01/08/2013   Procedure: DILATATION & CURETTAGE/HYSTEROSCOPY WITH TRUCLEAR, polypectomy;  Surgeon: Sharon Bond A. Pamala Hurry, MD;  Location: [facility];  Service: Gynecology;  Laterality: N/A;   LAPAROSCOPIC TUBAL LIGATION Bilateral 01/08/2013   Procedure: DIAGNOSTIC LAPAROSCOPY Bilateral Salpingectomy;  Surgeon: Sharon Bond A. Pamala Hurry, MD;  Location: [facility];  Service: Gynecology;  Laterality: Bilateral;   PARTIAL HYSTERECTOMY  2019   WISDOM TOOTH EXTRACTION      Current Medications: Current Meds  Medication Sig   albuterol (VENTOLIN HFA) 108 (90 Base) MCG/ACT inhaler Inhale 2 puffs into the lungs every 6 (six) hours as needed for wheezing or shortness of breath.   busPIRone (BUSPAR) 10 MG tablet TAKE 1 TABLET BY MOUTH EVERY DAY   EPINEPHrine 0.3 mg/0.3 mL IJ SOAJ injection Inject 0.3 mg into the muscle as needed for anaphylaxis.   Lactobacillus (PROBIOTIC ACIDOPHILUS PO) Take by mouth daily with breakfast.   Nebivolol HCl (BYSTOLIC) 20 MG TABS Take 1 tablet (20 mg total) by mouth daily.   omeprazole (PRILOSEC OTC) 20 MG tablet Take 1 tablet (  20 mg total) by mouth daily.   spironolactone (ALDACTONE) 25 MG tablet Take 1 tablet (25 mg total) by mouth daily.   traZODone (DESYREL) 50 MG tablet TAKE 1/2 TO 1 TABLET BY MOUTH AT BEDTIME AS NEEDED FOR SLEEP   triamcinolone cream (KENALOG) 0.1 % Apply 1 application. topically as needed. Pt using at least 4 times a day   valsartan (DIOVAN) 320 MG tablet Take 1 tablet (320 mg total) by mouth daily.     Allergies:   Penicillins, Olmesartan, and Amlodipine   Social History   Socioeconomic History   Marital status: Married    Spouse name: Not on file   Number of children: Not on file    Years of education: Not on file   Highest education level: Not on file  Occupational History   Not on file  Tobacco Use   Smoking status: Never   Smokeless tobacco: Never  Vaping Use   Vaping Use: Never used  Substance and Sexual Activity   Alcohol use: Yes    Comment: occasionally during holidays, wine or beer   Drug use: Never   Sexual activity: Yes    Partners: Male    Birth control/protection: Surgical  Other Topics Concern   Not on file  Social History Narrative   married, 55 yo son, 105 yo, 45 yo, exercise - some with walking   Lives at home with spouse and 43 y.o. son   Left handed   Caffeine: decaf coffee on the weekend    Social Determinants of Health   Financial Resource Strain: Not on file  Food Insecurity: No Food Insecurity (08/14/2021)   Hunger Vital Sign    Worried About Running Out of Food in the Last Year: Never true    Ran Out of Food in the Last Year: Never true  Transportation Needs: Not on file  Physical Activity: Not on file  Stress: Not on file  Social Connections: Not on file     Family History: The patient's family history includes Diabetes in her brother and mother; Healthy in her father; Heart disease in her maternal aunt; Hypertension in her mother. There is no history of Cancer, Stroke, Sleep apnea, Colon cancer, Colon polyps, Esophageal cancer, Rectal cancer, or Stomach cancer.  ROS:   Please see the history of present illness.    Review of Systems  Constitutional:  Negative for chills, fever and malaise/fatigue.  Eyes:  Negative for blurred vision and redness.  Respiratory:  Positive for shortness of breath ((exertional and at night)).   Cardiovascular:  Positive for palpitations. Negative for chest pain, orthopnea, claudication, leg swelling and PND.  Gastrointestinal:  Negative for blood in stool, nausea and vomiting.  Genitourinary:  Negative for dysuria and flank pain.  Musculoskeletal:  Negative for falls.  Neurological:  Positive  for headaches. Negative for dizziness and loss of consciousness.  Endo/Heme/Allergies:  Negative for polydipsia.  Psychiatric/Behavioral:  Positive for depression.     EKGs/Labs/Other Studies Reviewed:    The following studies were reviewed today: Coronary CTA 05/11/20: FINDINGS: Non-cardiac: See separate report from [facility].   No LA appendage thrombus. Pulmonary veins drain normally to the left atrium.   Calcium Score: 0 Agatston units.   Coronary Arteries: Right dominant. The LAD and LCx have separate origins from the left cusp.   LM: No LM, separate origins of LAD and LCx from left cusp.   LAD system: Separate origin off left cusp.  No plaque or stenosis.   Circumflex system:  Separate origin off left cusp, early acute angle turn. No plaque or stenosis.   RCA system: No plaque or stenosis.   IMPRESSION: 1. Coronary artery calcium score 0 Agatston units. This suggests low risk for future cardiac events.   2.  No significant coronary disease noted.   3.  Separate origins of LCx and LAD from left cusp.   TTE 05/04/20: IMPRESSIONS     1. Left ventricular ejection fraction, by estimation, is 60 to 65%. The  left ventricle has normal function. The left ventricle has no regional  wall motion abnormalities. There is mild concentric left ventricular  hypertrophy. Left ventricular diastolic  parameters were normal.   2. Right ventricular systolic function is normal. The right ventricular  size is normal.   3. The mitral valve is normal in structure. Trivial mitral valve  regurgitation.   4. The aortic valve is tricuspid. Aortic valve regurgitation is not  visualized. No aortic stenosis is present.   5. The inferior vena cava is normal in size with greater than 50%  respiratory variability, suggesting right atrial pressure of 3 mmHg.   EKG:  EKG is personally reviewed.  12/06/2021: Normal sinus rhythm. 65 bpm.  03/06/2021: No new tracing today  Recent  Labs: 05/09/2021: Hemoglobin 13.8; Platelets 190 09/17/2021: BUN 10; Creatinine, Ser 0.77; Potassium 3.9; Sodium 144  Recent Lipid Panel    Component Value Date/Time   CHOL 178 09/17/2021 1655   TRIG 114 09/17/2021 1655   HDL 53 09/17/2021 1655   CHOLHDL 3.4 09/17/2021 1655   CHOLHDL 3.5 07/18/2014 0001   VLDL 18 07/18/2014 0001   LDLCALC 105 (H) 09/17/2021 1655     Physical Exam:    VS:  BP 110/78   Pulse 65   Ht 5' 0.5" (1.537 m)   Wt 227 lb 9.6 oz (103.2 kg)   LMP 11/28/2012   SpO2 95%   BMI 43.72 kg/m     Wt Readings from Last 3 Encounters:  12/06/21 227 lb 9.6 oz (103.2 kg)  09/17/21 225 lb (102.1 kg)  05/24/21 227 lb (103 kg)     GEN:  Comfortable, NAD HEENT: Normal NECK: No JVD; No carotid bruits CARDIAC: RRR, 1/6 systolic murmur RESPIRATORY:  Clear to auscultation without rales, wheezing or rhonchi  ABDOMEN: Soft, non-tender, non-distended MUSCULOSKELETAL:  Warm, no edema SKIN: Warm and dry NEUROLOGIC:  Alert and oriented x 3 PSYCHIATRIC:  Normal affect   ASSESSMENT:    1. Dyspnea on exertion   2. Palpitations   3. Essential hypertension   4. Leg edema   5. Obesity, Class III, BMI 40-49.9 (morbid obesity) ([place])   6. Precordial pain     PLAN:    In order of problems listed above:  #DOE: #Chest Pressure: Reassuring cardiac work-up. Coronary CTA with no obstructive CAD. TTE with normal LVEF, no significant valve disease. Now improved with better control of BP. BP is well controlled. Given persistence of symptoms, patient would like to see Pulmonary for further evaluation. -Refer to Pulm -Coronary CTA without obstructive disease -TTE with normal LVEF, no significant valve disease -Manage HTN as below  #Palpitations: Continues to have palpitations at night when laying down. Will check 3 day zio to monitor further.  -Check 3 day zio  #HTN: Much better controlled. -Continue valsartan '320mg'$  daily -Continue nebivolol  '20mg'$  daily -Continue  spironolactone '25mg'$  daily -Did not tolerate amlodipine or lisinopril in the past -Adherent to low Na diet  #LE edema: Improved. Echo with normal LVEF 60-65%,  no significant valve disease. -Continue HCTZ '25mg'$  daily -Leg elevation at the end of the day  #Obesity: BMI 43. Trying to work on diet and exercise to lose weight. -Continue diet and lifestyle modifications  Follow Up: 6 months Medication Adjustments/Labs and Tests Ordered: Current medicines are reviewed at length with the patient today.  Concerns regarding medicines are outlined above.  Orders Placed This Encounter  Procedures   Ambulatory referral to Pulmonology   LONG TERM MONITOR (3-14 DAYS)    No orders of the defined types were placed in this encounter.   Patient Instructions  Medication Instructions:   *If you need a refill on your cardiac medications before your next appointment, please call your pharmacy*   Lab Work:  If you have labs (blood work) drawn today and your tests are completely normal, you will receive your results only by: [place] (if you have MyChart) OR A paper copy in the mail If you have any lab test that is abnormal or we need to change your treatment, we will call you to review the results.   Testing/Procedures: [place] [place]- Long Term Monitor Instructions  Your physician has requested you wear a ZIO patch monitor for 3 days.  This is a single patch monitor. [employer] supplies one patch monitor per enrollment. Additional stickers are not available. Please do not apply patch if you will be having a Nuclear Stress Test,  Echocardiogram, Cardiac CT, MRI, or Chest Xray during the period you would be wearing the  monitor. The patch cannot be worn during these tests. You cannot remove and re-apply the  ZIO XT patch monitor.  Your ZIO patch monitor will be mailed 3 day USPS to your address on file. It may take 3-5 days  to receive your monitor after you have been enrolled.  Once you have  received your monitor, please review the enclosed instructions. Your monitor  has already been registered assigning a specific monitor serial # to you.  Billing and Patient Assistance Program Information  We have supplied [employer] with any of your insurance information on file for billing purposes. [employer] offers a sliding scale Patient Assistance Program for patients that do not have  insurance, or whose insurance does not completely cover the cost of the ZIO monitor.  You must apply for the Patient Assistance Program to qualify for this discounted rate.  To apply, please call [employer] at [phone], select option 4, select option 2, ask to apply for  Patient Assistance Program. Theodore Demark will ask your household income, and how many people  are in your household. They will quote your out-of-pocket cost based on that information.  [employer] will also be able to set up a 56-month interest-free payment plan if needed.  Applying the monitor   Shave hair from upper left chest.  Hold abrader disc by orange tab. Rub abrader in 40 strokes over the upper left chest as  indicated in your monitor instructions.  Clean area with 4 enclosed alcohol pads. Let dry.  Apply patch as indicated in monitor instructions. Patch will be placed under collarbone on left  side of chest with arrow pointing upward.  Rub patch adhesive wings for 2 minutes. Remove white label marked "1". Remove the white  label marked "2". Rub patch adhesive wings for 2 additional minutes.  While looking in a mirror, press and release button in center of patch. A small green light will  flash 3-4 times. This will be your only indicator that the monitor has been turned  on.  Do not shower for the first 24 hours. You may shower after the first 24 hours.  Press the button if you feel a symptom. You will hear a small click. Record Date, Time and  Symptom in the Patient Logbook.  When you are ready to remove the patch, follow instructions on  the last 2 pages of Patient  Logbook. Stick patch monitor onto the last page of Patient Logbook.  Place Patient Logbook in the blue and white box. Use locking tab on box and tape box closed  securely. The blue and white box has prepaid postage on it. Please place it in the mailbox as  soon as possible. Your physician should have your test results approximately 7 days after the  monitor has been mailed back to [facility].  Call [facility] at [phone] if you have questions regarding  your ZIO XT patch monitor. Call them immediately if you see an orange light blinking on your  monitor.  If your monitor falls off in less than 4 days, contact our Monitor department at [phone].  If your monitor becomes loose or falls off after 4 days call [employer] at [phone] for  suggestions on securing your monitor    Follow-Up: At [facility], you and your health needs are our priority.  As part of our continuing mission to provide you with exceptional heart care, we have created designated Provider Care Teams.  These Care Teams include your primary Cardiologist (physician) and Advanced Practice Providers (APPs -  Physician Assistants and Nurse Practitioners) who all work together to provide you with the care you need, when you need it.  We recommend signing up for the patient portal called "MyChart".  Sign up information is provided on this After Visit Summary.  MyChart is used to connect with patients for Virtual Visits (Telemedicine).  Patients are able to view lab/test results, encounter notes, upcoming appointments, etc.  Non-urgent messages can be sent to your provider as well.   To learn more about what you can do with MyChart, go to [email or web address].    Your next appointment:   6 months   The format for your next appointment:   In Person  Provider:   Dr Gwyndolyn Kaufman     Other Instructions Fairmead Ford,acting as a scribe for Freada Bergeron, MD.,have documented all relevant documentation on the behalf of Freada Bergeron, MD,as directed by  Freada Bergeron, MD while in the presence of Freada Bergeron, MD.   I, Freada Bergeron, MD, have reviewed all documentation for this visit. The documentation on 12/06/21 for the exam, diagnosis, procedures, and orders are all accurate and complete.   Signed, Freada Bergeron, MD  12/06/2021 9:53 AM    [place]

## 2021-12-06 NOTE — Progress Notes (Unsigned)
Enrolled for [employer] to mail a ZIO XT long term holter monitor to the patients address on file.  

## 2021-12-09 DIAGNOSIS — R002 Palpitations: Secondary | ICD-10-CM | POA: Diagnosis not present

## 2021-12-18 ENCOUNTER — Ambulatory Visit (HOSPITAL_COMMUNITY)
Admission: EM | Admit: 2021-12-18 | Discharge: 2021-12-18 | Disposition: A | Discharge status: Home / Self Care | Payer: Medicaid Other | Attending: Emergency Medicine | Admitting: Emergency Medicine

## 2021-12-18 ENCOUNTER — Encounter (HOSPITAL_COMMUNITY): Payer: Self-pay

## 2021-12-18 DIAGNOSIS — J069 Acute upper respiratory infection, unspecified: Secondary | ICD-10-CM | POA: Diagnosis not present

## 2021-12-18 DIAGNOSIS — R0981 Nasal congestion: Secondary | ICD-10-CM

## 2021-12-18 DIAGNOSIS — H65191 Other acute nonsuppurative otitis media, right ear: Secondary | ICD-10-CM | POA: Diagnosis not present

## 2021-12-18 MED ORDER — CEFDINIR 300 MG PO CAPS: 300.0000 mg | ORAL_CAPSULE | Freq: Two times a day (BID) | ORAL | 0 refills | Status: AC

## 2021-12-18 MED ORDER — FLUTICASONE PROPIONATE 50 MCG/ACT NA SUSP: 1.0000 | Freq: Every day | NASAL | 2 refills | Status: DC

## 2021-12-18 MED ORDER — CETIRIZINE HCL 10 MG PO TABS: 10.0000 mg | ORAL_TABLET | Freq: Every day | ORAL | 2 refills | Status: DC

## 2021-12-18 NOTE — Discharge Instructions (Addendum)
Please take antibiotic as prescribed. Take with food to avoid upset stomach.  Additionally I recommend daily allergy medicine (zyrtec) with daily nasal spray (flonase). You can get these over the counter but I have also sent to your pharmacy.  If congestion persists, try twice daily muinex (guaifenacin). Take with lots of water.

## 2021-12-18 NOTE — ED Triage Notes (Signed)
Pt reports a head cold. She has taken meds for it but no relief since sunday. Reports fluid in both of her ears since saturday

## 2021-12-18 NOTE — ED Provider Notes (Signed)
[facility]    CSN: [id number] Arrival date & time: 12/18/21  0957      History   Chief Complaint Chief Complaint  Patient presents with   Otalgia    HPI Sharon Bond is a 56 y.o. female.  Presents with 3-day history of ear pain Reports she feels there is fluid in her ears, pain and pressure mostly in the right. Has been congested recently.  Tried OTC decongestant without relief  Denies any fevers.  No drainage from the ears.  Denies hearing changes  Past Medical History:  Diagnosis Date   Allergy    pollen   Anxiety 2022   Eczema    GERD (gastroesophageal reflux disease)    Hypertension    IUD (intrauterine device) in place 2005   removed 2015   Mixed incontinence    Seasonal allergic rhinitis    Sleep apnea 10/2020   11/16/20--has not rec'd cpap yet   Wears glasses     Patient Active Problem List   Diagnosis Date Noted   Obstructive sleep apnea hypopnea, moderate 10/03/2020   Insomnia secondary to chronic pain 06/28/2020   Psychophysiological insomnia 06/28/2020   Class 3 severe obesity due to excess calories without serious comorbidity with body mass index (BMI) of 40.0 to 44.9 in adult ([place]) 06/28/2020   Snoring 06/28/2020   Pain in right arm 05/31/2019   Dysmenorrhea 08/12/2017   Dyspareunia in female 08/12/2017   Fibroid uterus 08/12/2017   Menorrhagia with regular cycle 08/12/2017   Intrinsic eczema 02/27/2017   Obesity, Class III, BMI 40-49.9 (morbid obesity) ([place]) 02/13/2017   Elevated blood pressure 10/07/2012   Hypokalemia 03/20/2006   ANXIETY 03/20/2006   Essential hypertension 03/20/2006   Rhinitis, allergic 03/20/2006   GASTROESOPHAGEAL REFLUX, NO ESOPHAGITIS 03/20/2006   FIBROMYALGIA, FIBROMYOSITIS 03/20/2006   MUSCLE CRAMPS NOS 03/20/2006    Past Surgical History:  Procedure Laterality Date   DILATATION & CURETTAGE/HYSTEROSCOPY WITH TRUECLEAR N/A 01/08/2013   Procedure: DILATATION & CURETTAGE/HYSTEROSCOPY WITH TRUCLEAR,  polypectomy;  Surgeon: Claiborne Billings A. Pamala Hurry, MD;  Location: [facility];  Service: Gynecology;  Laterality: N/A;   LAPAROSCOPIC TUBAL LIGATION Bilateral 01/08/2013   Procedure: DIAGNOSTIC LAPAROSCOPY Bilateral Salpingectomy;  Surgeon: Claiborne Billings A. Pamala Hurry, MD;  Location: [facility];  Service: Gynecology;  Laterality: Bilateral;   PARTIAL HYSTERECTOMY  2019   WISDOM TOOTH EXTRACTION      OB History     Gravida  4   Para  4   Term  3   Preterm  1   AB      Living  2      SAB      IAB      Ectopic      Multiple      Live Births  2            Home Medications    Prior to Admission medications   Medication Sig Start Date End Date Taking? Authorizing Provider  cefdinir (OMNICEF) 300 MG capsule Take 1 capsule (300 mg total) by mouth 2 (two) times daily for 5 days. 12/18/21 12/23/21 Yes Dewie Ahart, Wells Guiles, PA-C  cetirizine (ZYRTEC ALLERGY) 10 MG tablet Take 1 tablet (10 mg total) by mouth daily. 12/18/21  Yes Joshalyn Ancheta, PA-C  fluticasone (FLONASE) 50 MCG/ACT nasal spray Place 1 spray into both nostrils daily. 12/18/21  Yes Lennix Rotundo, Wells Guiles, PA-C  albuterol (VENTOLIN HFA) 108 (90 Base) MCG/ACT inhaler Inhale 2 puffs into the lungs every 6 (six) hours as needed for wheezing or  shortness of breath. 03/23/20   [name], FNP  busPIRone (BUSPAR) 10 MG tablet TAKE 1 TABLET BY MOUTH EVERY DAY 07/18/21   [name], FNP  diclofenac Sodium (VOLTAREN) 1 % GEL APPLY 2 GRAMS TO AFFECTED AREA 4 TIMES A DAY    [provider]  EPINEPHrine 0.3 mg/0.3 mL IJ SOAJ injection Inject 0.3 mg into the muscle as needed for anaphylaxis. 03/23/20   [name], FNP  Lactobacillus (PROBIOTIC ACIDOPHILUS PO) Take by mouth daily with breakfast.    [provider]  Nebivolol HCl (BYSTOLIC) 20 MG TABS Take 1 tablet (20 mg total) by mouth daily. 09/17/21   Freada Bergeron, MD  omeprazole (PRILOSEC OTC) 20 MG tablet Take 1 tablet (20 mg total) by mouth daily. 08/30/20   [name], FNP   spironolactone (ALDACTONE) 25 MG tablet Take 1 tablet (25 mg total) by mouth daily. 09/17/21   Freada Bergeron, MD  traZODone (DESYREL) 50 MG tablet TAKE 1/2 TO 1 TABLET BY MOUTH AT BEDTIME AS NEEDED FOR SLEEP 11/20/21   Dohmeier, Asencion Partridge, MD  triamcinolone cream (KENALOG) 0.1 % Apply 1 application. topically as needed. Pt using at least 4 times a day 05/09/21   [name], FNP  valsartan (DIOVAN) 320 MG tablet Take 1 tablet (320 mg total) by mouth daily. 11/28/21   Freada Bergeron, MD    Family History Family History  Problem Relation Age of Onset   Diabetes Mother    Hypertension Mother    Healthy Father    Diabetes Brother    Heart disease Maternal Aunt    Cancer Neg Hx    Stroke Neg Hx    Sleep apnea Neg Hx    Colon cancer Neg Hx    Colon polyps Neg Hx    Esophageal cancer Neg Hx    Rectal cancer Neg Hx    Stomach cancer Neg Hx     Social History Social History   Tobacco Use   Smoking status: Never   Smokeless tobacco: Never  Vaping Use   Vaping Use: Never used  Substance Use Topics   Alcohol use: Yes    Comment: occasionally during holidays, wine or beer   Drug use: Never     Allergies   Olmesartan, Amlodipine, and Penicillins   Review of Systems Review of Systems  As per HPI  Physical Exam Triage Vital Signs ED Triage Vitals  Enc Vitals Group     BP 12/18/21 1146 134/84     Pulse Rate 12/18/21 1146 78     Resp 12/18/21 1146 20     Temp 12/18/21 1146 98.6 F (37 C)     Temp Source 12/18/21 1146 Oral     SpO2 12/18/21 1146 90 %     Weight --      Height --      Head Circumference --      Peak Flow --      Pain Score 12/18/21 1149 5     Pain Loc --      Pain Edu? --      Excl. in [place]? --    No data found.  Updated Vital Signs BP 134/84 (BP Location: Left Arm)   Pulse 78   Temp 98.6 F (37 C) (Oral)   Resp 20   LMP 11/28/2012   SpO2 90%    Physical Exam Vitals and nursing note reviewed.  Constitutional:      General: She  is not in acute distress. HENT:  Right Ear: External ear normal. No mastoid tenderness. Tympanic membrane is injected and erythematous.     Left Ear: Tympanic membrane, ear canal and external ear normal.     Nose: Nose normal.     Mouth/Throat:     Mouth: Mucous membranes are moist.     Pharynx: Oropharynx is clear.  Eyes:     Extraocular Movements: Extraocular movements intact.     Conjunctiva/sclera: Conjunctivae normal.     Pupils: Pupils are equal, round, and reactive to light.  Cardiovascular:     Rate and Rhythm: Normal rate and regular rhythm.     Heart sounds: Normal heart sounds.  Pulmonary:     Effort: Pulmonary effort is normal.     Breath sounds: Normal breath sounds.  Lymphadenopathy:     Cervical: No cervical adenopathy.  Neurological:     Mental Status: She is alert and oriented to person, place, and time.     UC Treatments / Results  Labs (all labs ordered are listed, but only abnormal results are displayed) Labs Reviewed - No data to display  EKG   Radiology No results found.  Procedures Procedures  Medications Ordered in UC Medications - No data to display  Initial Impression / Assessment and Plan / UC Course  I have reviewed the triage vital signs and the nursing notes.  Pertinent labs & imaging results that were available during my care of the patient were reviewed by me and considered in my medical decision making (see chart for details).  Right AOM Patient has penicillin allergy but reaction is hives/rash.  She denies any history of swelling or anaphylaxis. Treat with cefdinir twice daily for 5 days.  Additionally recommend daily allergy medicine Zyrtec with Flonase to promote sinus drainage and relieve ear pressure. Return precautions discussed. Patient agrees to plan  Final Clinical Impressions(s) / UC Diagnoses   Final diagnoses:  Other acute nonsuppurative otitis media of right ear, recurrence not specified  Nasal congestion  Viral  URI     Discharge Instructions      Please take antibiotic as prescribed. Take with food to avoid upset stomach.  Additionally I recommend daily allergy medicine (zyrtec) with daily nasal spray (flonase). You can get these over the counter but I have also sent to your pharmacy.  If congestion persists, try twice daily muinex (guaifenacin). Take with lots of water.     ED Prescriptions     Medication Sig Dispense Auth. Provider   cefdinir (OMNICEF) 300 MG capsule Take 1 capsule (300 mg total) by mouth 2 (two) times daily for 5 days. 10 capsule Esli Clements, PA-C   fluticasone (FLONASE) 50 MCG/ACT nasal spray Place 1 spray into both nostrils daily. 11.1 mL Marysol Wellnitz, PA-C   cetirizine (ZYRTEC ALLERGY) 10 MG tablet Take 1 tablet (10 mg total) by mouth daily. 30 tablet Seymore Brodowski, Wells Guiles, PA-C      PDMP not reviewed this encounter.   Leannah Guse, Vernice Jefferson 12/18/21 2036

## 2021-12-26 ENCOUNTER — Encounter: Payer: Self-pay | Admitting: Nurse Practitioner

## 2021-12-26 ENCOUNTER — Ambulatory Visit: Payer: Medicaid Other | Admitting: Nurse Practitioner

## 2021-12-26 VITALS — BP 136/80 | HR 67 | Temp 97.8°F | Ht 60.0 in | Wt 227.6 lb

## 2021-12-26 DIAGNOSIS — E7800 Pure hypercholesterolemia, unspecified: Secondary | ICD-10-CM

## 2021-12-26 DIAGNOSIS — I10 Essential (primary) hypertension: Secondary | ICD-10-CM | POA: Diagnosis not present

## 2021-12-26 DIAGNOSIS — H6691 Otitis media, unspecified, right ear: Secondary | ICD-10-CM

## 2021-12-26 DIAGNOSIS — R7309 Other abnormal glucose: Secondary | ICD-10-CM | POA: Diagnosis not present

## 2021-12-26 MED ORDER — PREDNISONE 10 MG (21) PO TBPK: ORAL_TABLET | ORAL | 0 refills | Status: DC

## 2021-12-26 NOTE — Progress Notes (Signed)
Barnet Glasgow Martin,acting as a Education administrator for [name], FNP.,have documented all relevant documentation on the behalf of [name], FNP,as directed by  [name], FNP while in the presence of [name], [place].    Subjective:     Patient ID: Sharon Bond , female    DOB: November 14, 1965 , 56 y.o.   MRN: [id number]   Chief Complaint  Patient presents with   Hyperlipidemia    HPI  Patient presents today for a BP and Cholesterol check. Patient states compliance with medications. Patient would like to discuss pain she was having previously, patient states it is better. She is taking tumeric to her supplementation.   Patient also wants her ears check, she states previously having an ear infection.  She was treated for a double ear infection and sinus infection. Her right ear continues to "bother" her. Feels a vibration to her right ear when she walks.    BP Readings from Last 3 Encounters: 12/26/21 : 136/80 12/18/21 : 134/84 12/06/21 : 110/78    Hyperlipidemia This is a chronic problem. She has no history of chronic renal disease. There are no known factors aggravating her hyperlipidemia. Risk factors for coronary artery disease include obesity and a sedentary lifestyle.  Hypertension This is a chronic problem. The current episode started 1 to 4 weeks ago (last 2 weeks on Thursday felt pressure in her ears. she was in the bed all day.). The problem is uncontrolled. Associated symptoms include headaches. There are no associated agents to hypertension. Risk factors for coronary artery disease include obesity, sedentary lifestyle and stress (she is starting back into exercising). Compliance problems: routine follow up.  There is no history of angina or CAD/MI. There is no history of chronic renal disease.     Past Medical History:  Diagnosis Date   Allergy    pollen   Anxiety 2022   Eczema    GERD (gastroesophageal reflux disease)    Hypertension    IUD (intrauterine device) in  place 2005   removed 2015   Mixed incontinence    Seasonal allergic rhinitis    Sleep apnea 10/2020   11/16/20--has not rec'd cpap yet   Wears glasses      Family History  Problem Relation Age of Onset   Diabetes Mother    Hypertension Mother    Healthy Father    Diabetes Brother    Heart disease Maternal Aunt    Cancer Neg Hx    Stroke Neg Hx    Sleep apnea Neg Hx    Colon cancer Neg Hx    Colon polyps Neg Hx    Esophageal cancer Neg Hx    Rectal cancer Neg Hx    Stomach cancer Neg Hx      Current Outpatient Medications:    albuterol (VENTOLIN HFA) 108 (90 Base) MCG/ACT inhaler, Inhale 2 puffs into the lungs every 6 (six) hours as needed for wheezing or shortness of breath., Disp: 8 g, Rfl: 2   busPIRone (BUSPAR) 10 MG tablet, TAKE 1 TABLET BY MOUTH EVERY DAY, Disp: 90 tablet, Rfl: 2   cetirizine (ZYRTEC ALLERGY) 10 MG tablet, Take 1 tablet (10 mg total) by mouth daily., Disp: 30 tablet, Rfl: 2   diclofenac Sodium (VOLTAREN) 1 % GEL, APPLY 2 GRAMS TO AFFECTED AREA 4 TIMES A DAY, Disp: , Rfl:    EPINEPHrine 0.3 mg/0.3 mL IJ SOAJ injection, Inject 0.3 mg into the muscle as needed for anaphylaxis., Disp: 1 each, Rfl: 1  fluticasone (FLONASE) 50 MCG/ACT nasal spray, Place 1 spray into both nostrils daily., Disp: 11.1 mL, Rfl: 2   Lactobacillus (PROBIOTIC ACIDOPHILUS PO), Take by mouth daily with breakfast., Disp: , Rfl:    Nebivolol HCl (BYSTOLIC) 20 MG TABS, Take 1 tablet (20 mg total) by mouth daily., Disp: 90 tablet, Rfl: 1   omeprazole (PRILOSEC OTC) 20 MG tablet, Take 1 tablet (20 mg total) by mouth daily., Disp: 30 tablet, Rfl: 1   predniSONE (STERAPRED UNI-PAK 21 TAB) 10 MG (21) TBPK tablet, Take as directed, Disp: 21 tablet, Rfl: 0   spironolactone (ALDACTONE) 25 MG tablet, Take 1 tablet (25 mg total) by mouth daily., Disp: 90 tablet, Rfl: 1   traZODone (DESYREL) 50 MG tablet, TAKE 1/2 TO 1 TABLET BY MOUTH AT BEDTIME AS NEEDED FOR SLEEP, Disp: 60 tablet, Rfl: 0    triamcinolone cream (KENALOG) 0.1 %, Apply 1 application. topically as needed. Pt using at least 4 times a day, Disp: 30 g, Rfl: 2   valsartan (DIOVAN) 320 MG tablet, Take 1 tablet (320 mg total) by mouth daily., Disp: 90 tablet, Rfl: 0   diltiazem (CARDIZEM) 30 MG tablet, Take 1 tablet (30 mg total) by mouth in the morning and at bedtime., Disp: 180 tablet, Rfl: 1   Allergies  Allergen Reactions   Olmesartan     "cough"   Amlodipine Cough   Penicillins Rash and Other (See Comments)    Hives, rash. No airway symptoms      Review of Systems  Constitutional: Negative.   HENT:  Positive for ear pain (right ear pain and vibrations).   Respiratory: Negative.    Cardiovascular: Negative.   Gastrointestinal: Negative.   Neurological:  Positive for headaches.  Psychiatric/Behavioral: Negative.       Today's Vitals   12/26/21 1208  BP: 136/80  Pulse: 67  Temp: 97.8 F (36.6 C)  TempSrc: Oral  Weight: 227 lb 9.6 oz (103.2 kg)  Height: 5' (1.524 m)  PainSc: 0-No pain   Body mass index is 44.45 kg/m.  Wt Readings from Last 3 Encounters:  01/10/22 227 lb (103 kg)  12/26/21 227 lb 9.6 oz (103.2 kg)  12/06/21 227 lb 9.6 oz (103.2 kg)    Objective:  Physical Exam Vitals reviewed.  Constitutional:      General: She is not in acute distress.    Appearance: Normal appearance. She is obese.  HENT:     Head: Normocephalic.     Right Ear: External ear normal. Swelling present. There is no impacted cerumen. Tympanic membrane is erythematous.     Left Ear: Tympanic membrane, ear canal and external ear normal. There is no impacted cerumen.  Cardiovascular:     Rate and Rhythm: Normal rate and regular rhythm.     Pulses: Normal pulses.     Heart sounds: Normal heart sounds. No murmur heard. Pulmonary:     Effort: Pulmonary effort is normal. No respiratory distress.     Breath sounds: Normal breath sounds. No wheezing.  Skin:    Capillary Refill: Capillary refill takes less than 2  seconds.  Neurological:     General: No focal deficit present.     Mental Status: She is alert and oriented to person, place, and time.  Psychiatric:        Mood and Affect: Mood normal.        Behavior: Behavior normal.        Thought Content: Thought content normal.  Judgment: Judgment normal.         Assessment And Plan:     1. Elevated cholesterol Comments: Cholesterol levels have improved at last visit. Continue focusing on low fat diet and increasing fiber intake. - Lipid panel  2. Essential hypertension Comments: Blood pressure is fairly controlled, continue follow up with Cardiology and continue medications - BMP8+eGFR  3. Abnormal glucose Comments: Diet controlled, continue focusing on healthy diet and exercising regularly - Hemoglobin A1c  4. Right otitis media, unspecified otitis media type Comments: Erythema and slight swelling noted to ear canal, will treat with prednisone taper. If not better will consider referral to ENT for further evaluation - predniSONE (STERAPRED UNI-PAK 21 TAB) 10 MG (21) TBPK tablet; Take as directed  Dispense: 21 tablet; Refill: 0     Patient was given opportunity to ask questions. Patient verbalized understanding of the plan and was able to repeat key elements of the plan. All questions were answered to their satisfaction.  [name], FNP   I, [name], FNP, have reviewed all documentation for this visit. The documentation on 12/26/21 for the exam, diagnosis, procedures, and orders are all accurate and complete.   IF YOU HAVE BEEN REFERRED TO A SPECIALIST, IT MAY TAKE 1-2 WEEKS TO SCHEDULE/PROCESS THE REFERRAL. IF YOU HAVE NOT HEARD FROM US/SPECIALIST IN TWO WEEKS, PLEASE GIVE [place] A CALL AT [phone] X [phone].   THE PATIENT IS ENCOURAGED TO PRACTICE SOCIAL DISTANCING DUE TO THE COVID-19 PANDEMIC.

## 2021-12-26 NOTE — Patient Instructions (Signed)

## 2021-12-27 LAB — BMP8+EGFR
BUN/Creatinine Ratio: 15 (ref 9–23)
BUN: 12 mg/dL (ref 6–24)
CO2: 25 mmol/L (ref 20–29)
Calcium: 9.6 mg/dL (ref 8.7–10.2)
Chloride: 105 mmol/L (ref 96–106)
Creatinine, Ser: 0.78 mg/dL (ref 0.57–1.00)
Glucose: 90 mg/dL (ref 70–99)
Potassium: 4.1 mmol/L (ref 3.5–5.2)
Sodium: 143 mmol/L (ref 134–144)
eGFR: 90 mL/min/{1.73_m2} (ref >59)

## 2021-12-27 LAB — LIPID PANEL
Chol/HDL Ratio: 3.6 ratio (ref 0.0–4.4)
Cholesterol, Total: 196 mg/dL (ref 100–199)
HDL: 54 mg/dL (ref >39)
LDL Chol Calc (NIH): 125 mg/dL — ABNORMAL HIGH (ref 0–99)
Triglycerides: 96 mg/dL (ref 0–149)
VLDL Cholesterol Cal: 17 mg/dL (ref 5–40)

## 2021-12-27 LAB — HEMOGLOBIN A1C
Est. average glucose Bld gHb Est-mCnc: 131 mg/dL
Hgb A1c MFr Bld: 6.2 % — ABNORMAL HIGH (ref 4.8–5.6)

## 2021-12-28 ENCOUNTER — Telehealth: Payer: Self-pay | Admitting: *Deleted

## 2021-12-28 MED ORDER — DILTIAZEM HCL 30 MG PO TABS: 30.0000 mg | ORAL_TABLET | Freq: Two times a day (BID) | ORAL | 1 refills | Status: DC

## 2021-12-28 NOTE — Telephone Encounter (Signed)
The patient has been notified of the result and verbalized understanding.  All questions (if any) were answered.  Pt states she would like to proceed with adding an additional medication to help suppress her symptoms.   Pt will continue taking her bystolic as prescribed.   Verbal orders given by Dr. Johney Frame to start the pt on Diltiazem 30 mg po BID.   Confirmed the pharmacy of choice with the pt.   Pt verbalized understanding and agrees with this plan.

## 2021-12-28 NOTE — Telephone Encounter (Signed)
-----   Message from Freada Bergeron, MD sent at 12/27/2021  7:57 PM EST ----- Her heart monitor shows occasional extra beats from the top chamber of her heart which correlates with her symptoms. These are not harmful to her and the bystolic will help suppress them. Does she want to add a different medication to help decrease her symptoms further or is she doing okay?

## 2022-01-10 ENCOUNTER — Ambulatory Visit (INDEPENDENT_AMBULATORY_CARE_PROVIDER_SITE_OTHER): Payer: Medicaid Other

## 2022-01-10 VITALS — BP 110/78 | HR 72 | Temp 98.1°F | Ht 60.0 in | Wt 227.0 lb

## 2022-01-10 DIAGNOSIS — Z23 Encounter for immunization: Secondary | ICD-10-CM | POA: Diagnosis not present

## 2022-01-10 NOTE — Patient Instructions (Signed)

## 2022-01-10 NOTE — Progress Notes (Signed)
Pt presents today for flu vaccine.

## 2022-01-29 NOTE — Progress Notes (Signed)
Office Visit Note  Patient: Sharon Bond             Date of Birth: August 08, 1965           MRN: [id number]             PCP: [name], FNP Referring: [name], FNP Visit Date: 02/12/2022 Occupation: '@GUAROCC'$ @  Subjective:  Pain in multiple joints  History of Present Illness: Sharon Bond is a 57 y.o. female in consultation per request of her PCP.  According the patient she fell in 2019 on her stairs and initially she did not have any pain but in 2021 she started having pain in her left hip.  She was seen by her PCP and had a cortisone injection which helped temporarily.  She was again seen in the urgent care and had another cortisone injection.  She was also giving NSAIDs.  She continues to have off-and-on pain in her left hip.  She states that in 2021 she also had a work-related injury since then she has been having pain in her right hand and her right arm intermittently.  In July 2023 she fell in the parking lot and landed on her left knee joint.  She was seen in the urgent care where her knee joint was found to be swollen and she had x-rays of her left knee joint.  According the patient the x-rays were unremarkable.  No treatment was offered.  She has had discomfort and intermittent swelling in her ankle joints for the last 1 year.  She was seen by her PCP in April 2023 at that time her labs showed positive ANA for that reason she was referred to me.  She states for the last 2 weeks she has been also having some discomfort in her right elbow joint.  She has chronic discomfort in her lower back.  She was diagnosed with fibromyalgia syndrome many years ago.  She gives history of insomnia for which she takes trazodone.  He was given a prednisone taper in December 2023 helped.She gives history of fatigue, dry mouth, dry eyes, hair loss, joint pain.  There is no history of Raynaud's phenomenon, malar rash, photosensitivity or lymphadenopathy.  There is no family history of autoimmune disease.   She is gravida 4, para 3, stillbirth 1.  There is no history of DVTs.    Activities of Daily Living:  Patient reports morning stiffness for 5 minutes.   Patient Reports nocturnal pain.  Difficulty dressing/grooming: Reports Difficulty climbing stairs: Reports Difficulty getting out of chair: Reports Difficulty using hands for taps, buttons, cutlery, and/or writing: Reports  Review of Systems  Constitutional:  Positive for fatigue.  HENT:  Positive for mouth dryness. Negative for mouth sores.   Eyes:  Positive for dryness.  Respiratory:  Negative for shortness of breath.   Cardiovascular:  Negative for chest pain and palpitations.  Gastrointestinal:  Positive for constipation. Negative for blood in stool and diarrhea.  Endocrine: Positive for increased urination.  Genitourinary:  Positive for involuntary urination.  Musculoskeletal:  Positive for joint pain, gait problem, joint pain, joint swelling, myalgias, muscle weakness, morning stiffness, muscle tenderness and myalgias.  Skin:  Positive for hair loss. Negative for color change, rash and sensitivity to sunlight.  Allergic/Immunologic: Negative for susceptible to infections.  Neurological:  Positive for dizziness and headaches.  Hematological:  Negative for swollen glands.  Psychiatric/Behavioral:  Positive for sleep disturbance. Negative for depressed mood. The patient is nervous/anxious.  PMFS History:  Patient Active Problem List   Diagnosis Date Noted   Obstructive sleep apnea hypopnea, moderate 10/03/2020   Insomnia secondary to chronic pain 06/28/2020   Psychophysiological insomnia 06/28/2020   Class 3 severe obesity due to excess calories without serious comorbidity with body mass index (BMI) of 40.0 to 44.9 in adult ([place]) 06/28/2020   Snoring 06/28/2020   Pain in right arm 05/31/2019   Dysmenorrhea 08/12/2017   Dyspareunia in female 08/12/2017   Fibroid uterus 08/12/2017   Menorrhagia with regular cycle  08/12/2017   Intrinsic eczema 02/27/2017   Obesity, Class III, BMI 40-49.9 (morbid obesity) ([place]) 02/13/2017   Elevated blood pressure 10/07/2012   Hypokalemia 03/20/2006   ANXIETY 03/20/2006   Essential hypertension 03/20/2006   Rhinitis, allergic 03/20/2006   GASTROESOPHAGEAL REFLUX, NO ESOPHAGITIS 03/20/2006   FIBROMYALGIA, FIBROMYOSITIS 03/20/2006   MUSCLE CRAMPS NOS 03/20/2006    Past Medical History:  Diagnosis Date   Allergy    pollen   Anxiety 2022   Eczema    GERD (gastroesophageal reflux disease)    Hypertension    IUD (intrauterine device) in place 2005   removed 2015   Mixed incontinence    Seasonal allergic rhinitis    Sleep apnea 10/2020   11/16/20--has not rec'd cpap yet   Wears glasses     Family History  Problem Relation Age of Onset   Diabetes Mother    Hypertension Mother    Alzheimer's disease Mother    Healthy Father    Diabetes Brother    Heart disease Maternal Aunt    Healthy Son    Healthy Son    Hypertension Daughter    Healthy Daughter    Cancer Neg Hx    Stroke Neg Hx    Sleep apnea Neg Hx    Colon cancer Neg Hx    Colon polyps Neg Hx    Esophageal cancer Neg Hx    Rectal cancer Neg Hx    Stomach cancer Neg Hx    Past Surgical History:  Procedure Laterality Date   DILATATION & CURETTAGE/HYSTEROSCOPY WITH TRUECLEAR N/A 01/08/2013   Procedure: DILATATION & CURETTAGE/HYSTEROSCOPY WITH TRUCLEAR, polypectomy;  Surgeon: Claiborne Billings A. Pamala Hurry, MD;  Location: [facility];  Service: Gynecology;  Laterality: N/A;   LAPAROSCOPIC TUBAL LIGATION Bilateral 01/08/2013   Procedure: DIAGNOSTIC LAPAROSCOPY Bilateral Salpingectomy;  Surgeon: Claiborne Billings A. Pamala Hurry, MD;  Location: [facility];  Service: Gynecology;  Laterality: Bilateral;   PARTIAL HYSTERECTOMY  2019   WISDOM TOOTH EXTRACTION     Social History   Social History Narrative   married, 36 yo son, 37 yo, 61 yo, exercise - some with walking   Lives at home with spouse and 17 y.o. son   Left handed    Caffeine: decaf coffee on the weekend    Immunization History  Administered Date(s) Administered   Influenza,inj,Quad PF,6+ Mos 11/03/2012, 01/10/2022   PFIZER(Purple Top)SARS-COV-2 Vaccination 09/01/2019, 09/22/2019, 05/13/2020   Pneumococcal Polysaccharide-23 12/19/2020   Tdap 11/03/2012   Zoster Recombinat (Shingrix) 10/16/2020   Zoster, Live 05/24/2021     Objective: Vital Signs: BP 135/81 (BP Location: Right Arm, Patient Position: Sitting, Cuff Size: Large)   Pulse 62   Resp 15   Ht '5\' 2"'$  (1.575 m)   Wt 230 lb 9.6 oz (104.6 kg)   LMP 11/28/2012   BMI 42.18 kg/m    Physical Exam Vitals and nursing note reviewed.  Constitutional:      Appearance: She is well-developed.  HENT:  Head: Normocephalic and atraumatic.  Eyes:     Conjunctiva/sclera: Conjunctivae normal.  Cardiovascular:     Rate and Rhythm: Normal rate and regular rhythm.     Heart sounds: Normal heart sounds.  Pulmonary:     Effort: Pulmonary effort is normal.     Breath sounds: Normal breath sounds.  Abdominal:     General: Bowel sounds are normal.     Palpations: Abdomen is soft.  Musculoskeletal:     Cervical back: Normal range of motion.  Lymphadenopathy:     Cervical: No cervical adenopathy.  Skin:    General: Skin is warm and dry.     Capillary Refill: Capillary refill takes less than 2 seconds.  Neurological:     Mental Status: She is alert and oriented to person, place, and time.  Psychiatric:        Behavior: Behavior normal.      Musculoskeletal Exam: Cervical spine was in good range of motion.  She had painful limited range of motion of the lumbar spine.  Shoulder joints, elbow joints, wrist joints, MCPs PIPs and DIPs been good range of motion.  She had tenderness on palpation of her elbow without synovitis.  No synovitis was noted over wrist joints or MCPs or PIPs.  Hip joints were in good range of motion.  She had discomfort range of motion of left hip joint.  She also had tenderness  over left trochanteric bursa.  Knee joints were in good range of motion without any warmth swelling or effusion.  There was no tenderness over ankles or MTPs.  She had some discomfort over the left calcaneum.  CDAI Exam: CDAI Score: -- Patient Global: --; Provider Global: -- Swollen: --; Tender: -- Joint Exam 02/12/2022   No joint exam has been documented for this visit   There is currently no information documented on the homunculus. Go to the Rheumatology activity and complete the homunculus joint exam.  Investigation: No additional findings.  Imaging: No results found.  Recent Labs: Lab Results  Component Value Date   WBC 6.6 05/09/2021   HGB 13.8 05/09/2021   PLT 190 05/09/2021   NA 143 12/26/2021   K 4.1 12/26/2021   CL 105 12/26/2021   CO2 25 12/26/2021   GLUCOSE 90 12/26/2021   BUN 12 12/26/2021   CREATININE 0.78 12/26/2021   BILITOT 0.4 05/09/2020   ALKPHOS 116 05/09/2020   AST 14 05/09/2020   ALT 13 05/09/2020   PROT 7.4 05/09/2020   ALBUMIN 4.2 05/09/2020   CALCIUM 9.6 12/26/2021   GFRAA 102 03/09/2020    Speciality Comments: No specialty comments available.  Procedures:  No procedures performed Allergies: Olmesartan, Amlodipine, and Penicillins   Assessment / Plan:     Visit Diagnoses: Positive ANA (antinuclear antibody) -patient was evaluated for positive ANA today.  She gives history of fatigue, dry mouth, dry eyes.  There is no history of malar rash, Raynaud's phenomenon, photosensitivity or lymphadenopathy.  She gives history of arthralgias involving multiple joints.  I will will obtain labs today.  05/09/21: ANA+, ESR 51, dsDNA<1, C3 168 - Plan: Sedimentation rate, Anti-DNA antibody, double-stranded, C3 and C4, RNP Antibody, Anti-Councilman antibody, Sjogrens syndrome-A extractable nuclear antibody, Sjogrens syndrome-B extractable nuclear antibody, ANA, Anti-scleroderma antibody  Pain in right elbow-she complains of discomfort in her right elbow for the  last couple of weeks.  She had mild tenderness on palpation of her right lateral epicondyle region and over the elbow.  No synovitis was noted.  Pain  in right hand -she complains of pain in her right hand.  No synovitis was noted.  She relates it to a hand injury in 2021.  I will obtain following labs..  Plan: Uric acid, Rheumatoid factor, Cyclic citrul peptide antibody, IgG  Pain of left hip -she has been having pain and discomfort in her left hip for last 2 years.  She has had cortisone injections in the past.  She had tenderness over the trochanteric region and also some discomfort range of motion of her left hip.  Plan: XR HIP UNILAT W OR W/O PELVIS 2-3 VIEWS LEFT.  Vestibular spurring was noted.  No hip joint narrowing was noted.  A handout on IT band stretches was given.  Chronic pain of left knee-patient states she fell and July 2023.  She gets knee swelling which resolved now.  She had x-rays at the urgent care which were normal.  She still has intermittent pain in her knee joint.  No warmth swelling or effusion was noted.  Chronic pain of both ankles-she gives history of in her ankles.  She has seen Dr. Jacqualyn Posey in the past.  She plans to see him for the left ankle and left heel pain.  Chronic midline low back pain without sciatica -has history of lower back pain for many years.  She denies any radiculopathy.  The pain is localized.  She had no point tenderness.  A handout on back exercises was given.  Plan: XR Lumbar Spine 2-3 Views.  Anterior osteophytes are noted.  Facet joint arthropathy was noted.  Fibromyalgia-patient gives longstanding history of fibromyalgia.  She states she has generalized pain and discomfort for many years.  Essential hypertension-blood pressure was normal at 135/81 today.  Other medical problems are listed as follows:  History of gastroesophageal reflux (GERD)  Intrinsic eczema  History of anxiety  Insomnia secondary to chronic pain  Obstructive sleep  apnea hypopnea, moderate    Orders: Orders Placed This Encounter  Procedures   XR HIP UNILAT W OR W/O PELVIS 2-3 VIEWS LEFT   XR Lumbar Spine 2-3 Views   Sedimentation rate   Uric acid   Rheumatoid factor   Cyclic citrul peptide antibody, IgG   Anti-DNA antibody, double-stranded   C3 and C4   RNP Antibody   Anti-Robeck antibody   Sjogrens syndrome-A extractable nuclear antibody   Sjogrens syndrome-B extractable nuclear antibody   ANA   Anti-scleroderma antibody   No orders of the defined types were placed in this encounter.    Follow-Up Instructions: Return for Joint pain, positive ANA.   Bo Merino, MD  Note - This record has been created using Editor, commissioning.  Chart creation errors have been sought, but may not always  have been located. Such creation errors do not reflect on  the standard of medical care.

## 2022-02-12 ENCOUNTER — Encounter: Payer: Self-pay | Admitting: Rheumatology

## 2022-02-12 ENCOUNTER — Ambulatory Visit (INDEPENDENT_AMBULATORY_CARE_PROVIDER_SITE_OTHER): Payer: Medicaid Other

## 2022-02-12 ENCOUNTER — Ambulatory Visit: Payer: Medicaid Other | Attending: Rheumatology | Admitting: Rheumatology

## 2022-02-12 VITALS — BP 135/81 | HR 62 | Resp 15 | Ht 62.0 in | Wt 230.6 lb

## 2022-02-12 DIAGNOSIS — M25562 Pain in left knee: Secondary | ICD-10-CM

## 2022-02-12 DIAGNOSIS — M79641 Pain in right hand: Secondary | ICD-10-CM

## 2022-02-12 DIAGNOSIS — G4733 Obstructive sleep apnea (adult) (pediatric): Secondary | ICD-10-CM

## 2022-02-12 DIAGNOSIS — M25521 Pain in right elbow: Secondary | ICD-10-CM | POA: Diagnosis not present

## 2022-02-12 DIAGNOSIS — M25552 Pain in left hip: Secondary | ICD-10-CM

## 2022-02-12 DIAGNOSIS — M797 Fibromyalgia: Secondary | ICD-10-CM

## 2022-02-12 DIAGNOSIS — M25572 Pain in left ankle and joints of left foot: Secondary | ICD-10-CM

## 2022-02-12 DIAGNOSIS — Z8659 Personal history of other mental and behavioral disorders: Secondary | ICD-10-CM

## 2022-02-12 DIAGNOSIS — M25551 Pain in right hip: Secondary | ICD-10-CM

## 2022-02-12 DIAGNOSIS — M25571 Pain in right ankle and joints of right foot: Secondary | ICD-10-CM

## 2022-02-12 DIAGNOSIS — G8929 Other chronic pain: Secondary | ICD-10-CM

## 2022-02-12 DIAGNOSIS — R768 Other specified abnormal immunological findings in serum: Secondary | ICD-10-CM

## 2022-02-12 DIAGNOSIS — M545 Low back pain, unspecified: Secondary | ICD-10-CM

## 2022-02-12 DIAGNOSIS — L2084 Intrinsic (allergic) eczema: Secondary | ICD-10-CM

## 2022-02-12 DIAGNOSIS — I10 Essential (primary) hypertension: Secondary | ICD-10-CM

## 2022-02-12 DIAGNOSIS — Z8719 Personal history of other diseases of the digestive system: Secondary | ICD-10-CM

## 2022-02-12 DIAGNOSIS — G4701 Insomnia due to medical condition: Secondary | ICD-10-CM

## 2022-02-12 DIAGNOSIS — R7689 Other specified abnormal immunological findings in serum: Secondary | ICD-10-CM

## 2022-02-12 NOTE — Patient Instructions (Addendum)
Please schedule an appointment with Dr. Jacqualyn Posey for left heel pain Phone: [phone]   Back Exercises The following exercises strengthen the muscles that help to support the trunk (torso) and back. They also help to keep the lower back flexible. Doing these exercises can help to prevent or lessen existing low back pain. If you have back pain or discomfort, try doing these exercises 2-3 times each day or as told by your health care provider. As your pain improves, do them once each day, but increase the number of times that you repeat the steps for each exercise (do more repetitions). To prevent the recurrence of back pain, continue to do these exercises once each day or as told by your health care provider. Do exercises exactly as told by your health care provider and adjust them as directed. It is normal to feel mild stretching, pulling, tightness, or discomfort as you do these exercises, but you should stop right away if you feel sudden pain or your pain gets worse. Exercises Single knee to chest Repeat these steps 3-5 times for each leg: Lie on your back on a firm bed or the floor with your legs extended. Bring one knee to your chest. Your other leg should stay extended and in contact with the floor. Hold your knee in place by grabbing your knee or thigh with both hands and hold. Pull on your knee until you feel a gentle stretch in your lower back or buttocks. Hold the stretch for 10-30 seconds. Slowly release and straighten your leg.  Pelvic tilt Repeat these steps 5-10 times: Lie on your back on a firm bed or the floor with your legs extended. Bend your knees so they are pointing toward the ceiling and your feet are flat on the floor. Tighten your lower abdominal muscles to press your lower back against the floor. This motion will tilt your pelvis so your tailbone points up toward the ceiling instead of pointing to your feet or the floor. With gentle tension and even breathing, hold  this position for 5-10 seconds.  Cat-cow Repeat these steps until your lower back becomes more flexible: Get into a hands-and-knees position on a firm bed or the floor. Keep your hands under your shoulders, and keep your knees under your hips. You may place padding under your knees for comfort. Let your head hang down toward your chest. Contract your abdominal muscles and point your tailbone toward the floor so your lower back becomes rounded like the back of a cat. Hold this position for 5 seconds. Slowly lift your head, let your abdominal muscles relax, and point your tailbone up toward the ceiling so your back forms a sagging arch like the back of a cow. Hold this position for 5 seconds.  Press-ups Repeat these steps 5-10 times: Lie on your abdomen (face-down) on a firm bed or the floor. Place your palms near your head, about shoulder-width apart. Keeping your back as relaxed as possible and keeping your hips on the floor, slowly straighten your arms to raise the top half of your body and lift your shoulders. Do not use your back muscles to raise your upper torso. You may adjust the placement of your hands to make yourself more comfortable. Hold this position for 5 seconds while you keep your back relaxed. Slowly return to lying flat on the floor.  Bridges Repeat these steps 10 times: Lie on your back on a firm bed or the floor. Bend your knees so they are pointing  toward the ceiling and your feet are flat on the floor. Your arms should be flat at your sides, next to your body. Tighten your buttocks muscles and lift your buttocks off the floor until your waist is at almost the same height as your knees. You should feel the muscles working in your buttocks and the back of your thighs. If you do not feel these muscles, slide your feet 1-2 inches (2.5-5 cm) farther away from your buttocks. Hold this position for 3-5 seconds. Slowly lower your hips to the starting position, and allow your  buttocks muscles to relax completely. If this exercise is too easy, try doing it with your arms crossed over your chest. Abdominal crunches Repeat these steps 5-10 times: Lie on your back on a firm bed or the floor with your legs extended. Bend your knees so they are pointing toward the ceiling and your feet are flat on the floor. Cross your arms over your chest. Tip your chin slightly toward your chest without bending your neck. Tighten your abdominal muscles and slowly raise your torso high enough to lift your shoulder blades a tiny bit off the floor. Avoid raising your torso higher than that because it can put too much stress on your lower back and does not help to strengthen your abdominal muscles. Slowly return to your starting position.  Back lifts Repeat these steps 5-10 times: Lie on your abdomen (face-down) with your arms at your sides, and rest your forehead on the floor. Tighten the muscles in your legs and your buttocks. Slowly lift your chest off the floor while you keep your hips pressed to the floor. Keep the back of your head in line with the curve in your back. Your eyes should be looking at the floor. Hold this position for 3-5 seconds. Slowly return to your starting position.  Contact a health care provider if: Your back pain or discomfort gets much worse when you do an exercise. Your worsening back pain or discomfort does not lessen within 2 hours after you exercise. If you have any of these problems, stop doing these exercises right away. Do not do them again unless your health care provider says that you can. Get help right away if: You develop sudden, severe back pain. If this happens, stop doing the exercises right away. Do not do them again unless your health care provider says that you can. This information is not intended to replace advice given to you by your health care provider. Make sure you discuss any questions you have with your health care  provider. Document Revised: 07/04/2020 Document Reviewed: 03/22/2020 Elsevier Patient Education  [place] [facility] Ask your health care provider which exercises are safe for you. Do exercises exactly as told by your health care provider and adjust them as directed. It is normal to feel mild stretching, pulling, tightness, or discomfort as you do these exercises. Stop right away if you feel sudden pain or your pain gets significantly worse. Do not begin these exercises until told by your health care provider. Stretching and range-of-motion exercises These exercises warm up your muscles and joints and improve the movement and flexibility of your hip and pelvis. Quadriceps stretch, prone  Lie on your abdomen (prone position) on a firm surface, such as a bed or padded floor. Bend your left / right knee and reach back to hold your ankle or pant leg. If you cannot reach your ankle or pant leg, loop a belt  around your foot and grab the belt instead. Gently pull your heel toward your buttocks. Your knee should not slide out to the side. You should feel a stretch in the front of your thigh and knee (quadriceps). Hold this position for __________ seconds. Repeat __________ times. Complete this exercise __________ times a day. Iliotibial band stretch An iliotibial band is a strong band of muscle tissue that runs from the outer side of your hip to the outer side of your thigh and knee. Lie on your side with your left / right leg in the top position. Bend both of your knees and grab your left / right ankle. Stretch out your bottom arm to help you balance. Slowly bring your top knee back so your thigh goes behind your trunk. Slowly lower your top leg toward the floor until you feel a gentle stretch on the outside of your left / right hip and thigh. If you do not feel a stretch and your knee will not fall farther, place the heel of your other foot on top of your knee and pull  your knee down toward the floor with your foot. Hold this position for __________ seconds. Repeat __________ times. Complete this exercise __________ times a day. Strengthening exercises These exercises build strength and endurance in your hip and pelvis. Endurance is the ability to use your muscles for a long time, even after they get tired. Straight leg raises, side-lying This exercise strengthens the muscles that rotate the leg at the hip and move it away from your body (hip abductors). Lie on your side with your left / right leg in the top position. Lie so your head, shoulder, hip, and knee line up. You may bend your bottom knee to help you balance. Roll your hips slightly forward so your hips are stacked directly over each other and your left / right knee is facing forward. Tense the muscles in your outer thigh and lift your top leg 4-6 inches (10-15 cm). Hold this position for __________ seconds. Slowly lower your leg to return to the starting position. Let your muscles relax completely before doing another repetition. Repeat __________ times. Complete this exercise __________ times a day. Leg raises, prone This exercise strengthens the muscles that move the hips backward (hip extensors). Lie on your abdomen (prone position) on your bed or a firm surface. You can put a pillow under your hips if that is more comfortable for your lower back. Bend your left / right knee so your foot is straight up in the air. Squeeze your buttocks muscles and lift your left / right thigh off the bed. Do not let your back arch. Tense your thigh muscle as hard as you can without increasing any knee pain. Hold this position for __________ seconds. Slowly lower your leg to return to the starting position and allow it to relax completely. Repeat __________ times. Complete this exercise __________ times a day. Hip hike Stand sideways on a bottom step. Stand on your left / right leg with your other foot  unsupported next to the step. You can hold on to a railing or wall for balance if needed. Keep your knees straight and your torso square. Then lift your left / right hip up toward the ceiling. Slowly let your left / right hip lower toward the floor, past the starting position. Your foot should get closer to the floor. Do not lean or bend your knees. Repeat __________ times. Complete this exercise __________ times a day. This information  is not intended to replace advice given to you by your health care provider. Make sure you discuss any questions you have with your health care provider. Document Revised: 03/17/2019 Document Reviewed: 03/17/2019 Elsevier Patient Education  [place].

## 2022-02-14 LAB — RNP ANTIBODY: Ribonucleic Protein(ENA) Antibody, IgG: <1 AI

## 2022-02-14 LAB — RHEUMATOID FACTOR: Rheumatoid fact SerPl-aCnc: <14 IU/mL (ref <14)

## 2022-02-14 LAB — SJOGRENS SYNDROME-B EXTRACTABLE NUCLEAR ANTIBODY: SSB (La) (ENA) Antibody, IgG: <1 AI

## 2022-02-14 LAB — C3 AND C4
C3 Complement: 170 mg/dL (ref 83–193)
C4 Complement: 51 mg/dL (ref 15–57)

## 2022-02-14 LAB — ANTI-SCLERODERMA ANTIBODY: Scleroderma (Scl-70) (ENA) Antibody, IgG: <1 AI

## 2022-02-14 LAB — ANTI-SMITH ANTIBODY: ENA SM Ab Ser-aCnc: <1 AI

## 2022-02-14 LAB — ANA: Anti Nuclear Antibody (ANA): NEGATIVE

## 2022-02-14 LAB — URIC ACID: Uric Acid, Serum: 4.3 mg/dL (ref 2.5–7.0)

## 2022-02-14 LAB — ANTI-DNA ANTIBODY, DOUBLE-STRANDED: ds DNA Ab: <1 IU/mL

## 2022-02-14 LAB — SJOGRENS SYNDROME-A EXTRACTABLE NUCLEAR ANTIBODY: SSA (Ro) (ENA) Antibody, IgG: <1 AI

## 2022-02-14 LAB — SEDIMENTATION RATE: Sed Rate: 33 mm/h — ABNORMAL HIGH (ref 0–30)

## 2022-02-14 LAB — CYCLIC CITRUL PEPTIDE ANTIBODY, IGG: Cyclic Citrullin Peptide Ab: <16 UNITS

## 2022-02-14 NOTE — Progress Notes (Signed)
I will discuss results at the follow-up visit.

## 2022-02-20 NOTE — Progress Notes (Signed)
Office Visit Note  Patient: Sharon Bond             Date of Birth: 01-23-1965           MRN: [id number]             PCP: [name], FNP Referring: [name], FNP Visit Date: 03/06/2022 Occupation: '@GUAROCC'$ @  Subjective:  Left hip pain  History of Present Illness: Sharon Bond is a 57 y.o. female with history of polyarthralgia and positive ANA she returns for the follow-up visit.  She states that her right lateral epicondyle pain has improved since the last visit.  Been using topical Voltaren gel.  She did not get in his elbow brace.  She has off-and-on discomfort in her hands but she has not noticed any joint swelling.  She continues to have some discomfort in her left hip especially on getting of the chair.  She has off-and-on discomfort in her left knee joint but she has not seen any joint swelling.  She states her left ankle joint continues to hurt and she has been followed by Dr. Jacqualyn Posey.  She is currently in a boot.  She continues to have lower back pain off-and-on.  She has generalized pain and discomfort from fibromyalgia.  She is also concerned about her bone density as she is postmenopausal.  Patient denies any history of prior fractures.  She denies intake of thyroid medications, antiepileptics and antacids.  She had menopause at the age of 49.    Activities of Daily Living:  Patient reports morning stiffness for 5-10 minutes.   Patient Reports nocturnal pain.  Difficulty dressing/grooming: Denies Difficulty climbing stairs: Reports Difficulty getting out of chair: Reports Difficulty using hands for taps, buttons, cutlery, and/or writing: Denies  Review of Systems  Constitutional:  Positive for fatigue.  HENT:  Positive for mouth dryness. Negative for mouth sores.   Eyes:  Positive for dryness.  Respiratory:  Negative for shortness of breath.   Cardiovascular:  Negative for chest pain and palpitations.  Gastrointestinal:  Positive for constipation. Negative for  blood in stool and diarrhea.  Endocrine: Positive for increased urination.  Genitourinary:  Negative for involuntary urination.  Musculoskeletal:  Positive for joint pain, joint pain, joint swelling, myalgias, morning stiffness, muscle tenderness and myalgias. Negative for gait problem and muscle weakness.  Skin:  Negative for color change and sensitivity to sunlight.  Allergic/Immunologic: Negative for susceptible to infections.  Neurological:  Positive for dizziness and headaches.  Hematological:  Negative for swollen glands.  Psychiatric/Behavioral:  Positive for sleep disturbance. Negative for depressed mood. The patient is nervous/anxious.     PMFS History:  Patient Active Problem List   Diagnosis Date Noted   Temporal pain 02/25/2022   Pain in joint of left knee 10/12/2021   Obstructive sleep apnea hypopnea, moderate 10/03/2020   Insomnia secondary to chronic pain 06/28/2020   Psychophysiological insomnia 06/28/2020   Class 3 severe obesity due to excess calories without serious comorbidity with body mass index (BMI) of 40.0 to 44.9 in adult [facility]) 06/28/2020   Snoring 06/28/2020   Pain in right arm 05/31/2019   Dysmenorrhea 08/12/2017   Dyspareunia in female 08/12/2017   Fibroid uterus 08/12/2017   Menorrhagia with regular cycle 08/12/2017   Intrinsic eczema 02/27/2017   Obesity, Class III, BMI 40-49.9 (morbid obesity) ([place]) 02/13/2017   Elevated blood pressure 10/07/2012   Hypokalemia 03/20/2006   ANXIETY 03/20/2006   Essential hypertension 03/20/2006   Rhinitis, allergic 03/20/2006  GASTROESOPHAGEAL REFLUX, NO ESOPHAGITIS 03/20/2006   FIBROMYALGIA, FIBROMYOSITIS 03/20/2006   MUSCLE CRAMPS NOS 03/20/2006    Past Medical History:  Diagnosis Date   Allergy    pollen   Anxiety 2022   Eczema    GERD (gastroesophageal reflux disease)    Hypertension    IUD (intrauterine device) in place 2005   removed 2015   Mixed incontinence    Seasonal allergic rhinitis    Sleep  apnea 10/2020   11/16/20--has not rec'd cpap yet   Wears glasses     Family History  Problem Relation Age of Onset   Diabetes Mother    Hypertension Mother    Alzheimer's disease Mother    Healthy Father    Diabetes Brother    Heart disease Maternal Aunt    Healthy Son    Healthy Son    Hypertension Daughter    Healthy Daughter    Cancer Neg Hx    Stroke Neg Hx    Sleep apnea Neg Hx    Colon cancer Neg Hx    Colon polyps Neg Hx    Esophageal cancer Neg Hx    Rectal cancer Neg Hx    Stomach cancer Neg Hx    Past Surgical History:  Procedure Laterality Date   DILATATION & CURETTAGE/HYSTEROSCOPY WITH TRUECLEAR N/A 01/08/2013   Procedure: DILATATION & CURETTAGE/HYSTEROSCOPY WITH TRUCLEAR, polypectomy;  Surgeon: Claiborne Billings A. Pamala Hurry, MD;  Location: [facility];  Service: Gynecology;  Laterality: N/A;   LAPAROSCOPIC TUBAL LIGATION Bilateral 01/08/2013   Procedure: DIAGNOSTIC LAPAROSCOPY Bilateral Salpingectomy;  Surgeon: Claiborne Billings A. Pamala Hurry, MD;  Location: [facility];  Service: Gynecology;  Laterality: Bilateral;   PARTIAL HYSTERECTOMY  2019   WISDOM TOOTH EXTRACTION     Social History   Social History Narrative   married, 35 yo son, 49 yo, 53 yo, exercise - some with walking   Lives at home with spouse and 83 y.o. son   Left handed   Caffeine: decaf coffee on the weekend    Immunization History  Administered Date(s) Administered   Influenza,inj,Quad PF,6+ Mos 11/03/2012, 01/10/2022   PFIZER(Purple Top)SARS-COV-2 Vaccination 09/01/2019, 09/22/2019, 05/13/2020   Pneumococcal Polysaccharide-23 12/19/2020   Tdap 11/03/2012   Zoster Recombinat (Shingrix) 10/16/2020   Zoster, Live 05/24/2021     Objective: Vital Signs: BP 134/81 (BP Location: Left Arm, Patient Position: Sitting, Cuff Size: Large)   Pulse (!) 57   Resp 20   Ht '5\' 2"'$  (1.575 m)   Wt 233 lb (105.7 kg)   LMP 11/28/2012   BMI 42.62 kg/m    Physical Exam Vitals and nursing note reviewed.  Constitutional:       Appearance: She is well-developed.  HENT:     Head: Normocephalic and atraumatic.  Eyes:     Conjunctiva/sclera: Conjunctivae normal.  Cardiovascular:     Rate and Rhythm: Normal rate and regular rhythm.     Heart sounds: Normal heart sounds.  Pulmonary:     Effort: Pulmonary effort is normal.     Breath sounds: Normal breath sounds.  Abdominal:     General: Bowel sounds are normal.     Palpations: Abdomen is soft.  Musculoskeletal:     Cervical back: Normal range of motion.  Lymphadenopathy:     Cervical: No cervical adenopathy.  Skin:    General: Skin is warm and dry.     Capillary Refill: Capillary refill takes less than 2 seconds.  Neurological:     Mental Status: She is alert and oriented  to person, place, and time.  Psychiatric:        Behavior: Behavior normal.      Musculoskeletal Exam: Cervical spine was in good range of motion.  She has some discomfort range of motion of the lumbar spine.  She had excessive lumbar lordosis.  Shoulder joints, elbow joints, wrist joints with good range of motion.  She had mild tenderness over right lateral epicondyle region.  There was no PIP or DIP thickening.  She had discomfort with abduction of her left hip joint.  Right hip joint was in good range of motion with tenderness over right trochanteric bursa.  Knee joints in good range of motion.  Left foot was in a boot.  She had generalized hyperalgesia and tender points.  CDAI Exam: CDAI Score: -- Patient Global: --; Provider Global: -- Swollen: --; Tender: -- Joint Exam 03/06/2022   No joint exam has been documented for this visit   There is currently no information documented on the homunculus. Go to the Rheumatology activity and complete the homunculus joint exam.  Investigation: No additional findings.  Imaging: DG Ankle Complete Left  Result Date: 03/04/2022 Please see detailed radiograph report in office note.  VAS [place] LOWER EXTREMITY VENOUS (DVT)  Result Date:  02/27/2022  Lower Venous DVT Study Patient Name:  Sharon Bond  Date of Exam:   02/27/2022 Medical Rec #: [id number]      Accession #:    [id number] Date of Birth: 05-30-1965      Patient Gender: F Patient Age:   81 years Exam Location:  [facility] Procedure:      VAS [place] LOWER EXTREMITY VENOUS (DVT) Referring Phys: Celesta Gentile --------------------------------------------------------------------------------  Indications: Patient fell on left knee back in 09/2021. One month later she started having left ankle pain since. She has left leg pain and is now wearing a calf boot. She denies chest pain and SOB.  Comparison Study: None Performing Technologist: Alecia Mackin RVT, RDCS (AE), RDMS  Examination Guidelines: A complete evaluation includes B-mode imaging, spectral Doppler, color Doppler, and power Doppler as needed of all accessible portions of each vessel. Bilateral testing is considered an integral part of a complete examination. Limited examinations for reoccurring indications may be performed as noted. The reflux portion of the exam is performed with the patient in reverse Trendelenburg.  +-----+---------------+---------+-----------+----------+--------------+ RIGHTCompressibilityPhasicitySpontaneityPropertiesThrombus Aging +-----+---------------+---------+-----------+----------+--------------+ CFV  Full           Yes      Yes                                 +-----+---------------+---------+-----------+----------+--------------+   +---------+---------------+---------+-----------+----------+--------------+ LEFT     CompressibilityPhasicitySpontaneityPropertiesThrombus Aging +---------+---------------+---------+-----------+----------+--------------+ CFV      Full           Yes      Yes                                 +---------+---------------+---------+-----------+----------+--------------+ SFJ      Full           Yes      Yes                                  +---------+---------------+---------+-----------+----------+--------------+ FV Prox  Full           Yes  Yes                                 +---------+---------------+---------+-----------+----------+--------------+ FV Mid   Full           Yes      Yes                                 +---------+---------------+---------+-----------+----------+--------------+ FV DistalFull           Yes      Yes                                 +---------+---------------+---------+-----------+----------+--------------+ PFV      Full                                                        +---------+---------------+---------+-----------+----------+--------------+ POP      Full           Yes      Yes                                 +---------+---------------+---------+-----------+----------+--------------+ PTV      Full           Yes      Yes                                 +---------+---------------+---------+-----------+----------+--------------+ PERO     Full           Yes      Yes                                 +---------+---------------+---------+-----------+----------+--------------+ Gastroc  Full                                                        +---------+---------------+---------+-----------+----------+--------------+ GSV      Full           Yes      Yes                                 +---------+---------------+---------+-----------+----------+--------------+    Findings reported to Dr. Leigh Aurora email thru [facility] at 10:00 am.  Summary: RIGHT: - No evidence of common femoral vein obstruction.  LEFT: - No evidence of deep vein thrombosis in the lower extremity. No indirect evidence of obstruction proximal to the inguinal ligament. - No cystic structure found in the popliteal fossa.  *See table(s) above for measurements and observations. Electronically signed by Harold Barban MD on 02/27/2022 at 10:03:14 PM.    Final    XR HIP UNILAT W OR W/O PELVIS 2-3 VIEWS  LEFT  Result Date: 02/12/2022 No significant hip joint narrowing was noted.  Acetabular spurring was noted.  Impression: Unremarkable x-rays of the left hip joint except for acetabular spurring.  XR Lumbar Spine 2-3 Views  Result Date: 02/12/2022 Anterior osteophytes were noted.  No significant disc space narrowing was noted.  Facet joint arthropathy was noted.  No SI joint sclerosis was noted. Impression: These findings are consistent with mild degenerative changes and facet joint arthropathy.   Recent Labs: Lab Results  Component Value Date   WBC 6.6 05/09/2021   HGB 13.8 05/09/2021   PLT 190 05/09/2021   NA 143 12/26/2021   K 4.1 12/26/2021   CL 105 12/26/2021   CO2 25 12/26/2021   GLUCOSE 90 12/26/2021   BUN 12 12/26/2021   CREATININE 0.78 12/26/2021   BILITOT 0.4 05/09/2020   ALKPHOS 116 05/09/2020   AST 14 05/09/2020   ALT 13 05/09/2020   PROT 7.4 05/09/2020   ALBUMIN 4.2 05/09/2020   CALCIUM 9.6 12/26/2021   GFRAA 102 03/09/2020   February 12, 2022 ANA negative, ENA (dsDNA, RNP, Seliga, SSA, SSB, SCL 70) negative, C3-C4 normal, RF negative, anti-CCP negative, uric acid 4.3, ESR 33  Speciality Comments: No specialty comments available.  Procedures:  No procedures performed Allergies: Olmesartan, Amlodipine, and Penicillins   Assessment / Plan:     Visit Diagnoses: Positive ANA (antinuclear antibody) - Repeat ANA negative, ENA negative, C3-C4 normal patient has no clinical features of autoimmune disease.  Patient gives history of sicca symptoms and joint pain.  There is no history of oral ulcers, nasal ulcers, malar rash, photosensitivity, malar rash or lymphadenopathy.  No further workup is needed.  Right lateral epicondylitis - She had tenderness over right lateral epicondyle region.  She has noticed some improvement by the application of Voltaren gel.  I discussed possible use of right tennis elbow brace if needed.  Pain in right hand - History of injury in 2021.  No  synovitis was noted.  All autoimmune workup was negative.  Pain of left hip - X-rays showed acetabular spurring.  X-rays were reviewed with the patient.  She had tenderness over right trochanteric trochanteric region.  Handout on IT band stretch this was given.  Chronic pain of left knee - History of intermittent pain and swelling.  No synovitis was noted.  Lower extremity muscle strengthening exercises were discussed.  Chronic pain of both ankles - Followed by Dr. Jacqualyn Posey.  Left foot is in a boot.  DDD (degenerative disc disease), lumbar - History of lower back pain for many years.  X-rays showed degenerative changes with anterior osteophytes and facet joint arthropathy.  A handout on back exercises was given.  Core strengthening exercises were discussed.  Fibromyalgia - Generalized hyperalgesia and positive tender points were noted.  Benefits of water aerobics, swimming, stretching and yoga were discussed.  Postmenopausal-patient had menopause at age 48.  She is interested in getting DEXA scan.  Will schedule DEXA scan.  Osteoporosis screening-there is no and history of intake of thyroid medications, antiepileptics or antacids.  There is no personal or family history of fractures.  Essential hypertension-blood pressure was normal at 134/81.  History of gastroesophageal reflux (GERD)  Insomnia secondary to chronic pain  History of anxiety  Obstructive sleep apnea hypopnea, moderate  Intrinsic eczema  Orders: Orders Placed This Encounter  Procedures   DG BONE DENSITY (DXA)   No orders of the defined types were placed in this encounter.    Follow-Up Instructions: Return in about 4 months (around 07/05/2022) for Osteoarthritis, Discuss DXA.   Bo Merino, MD  Note -  This record has been created using [employer].  Chart creation errors have been sought, but may not always  have been located. Such creation errors do not reflect on  the standard of medical care.

## 2022-02-25 ENCOUNTER — Ambulatory Visit: Payer: Medicaid Other | Admitting: Nurse Practitioner

## 2022-02-25 ENCOUNTER — Encounter: Payer: Self-pay | Admitting: Nurse Practitioner

## 2022-02-25 ENCOUNTER — Other Ambulatory Visit: Payer: Self-pay | Admitting: *Deleted

## 2022-02-25 VITALS — BP 138/82 | HR 64 | Temp 98.3°F | Wt 230.0 lb

## 2022-02-25 DIAGNOSIS — R519 Headache, unspecified: Secondary | ICD-10-CM

## 2022-02-25 DIAGNOSIS — R072 Precordial pain: Secondary | ICD-10-CM

## 2022-02-25 DIAGNOSIS — H9201 Otalgia, right ear: Secondary | ICD-10-CM | POA: Diagnosis not present

## 2022-02-25 MED ORDER — VALSARTAN 320 MG PO TABS: 320.0000 mg | ORAL_TABLET | Freq: Every day | ORAL | 1 refills | Status: DC

## 2022-02-25 NOTE — Progress Notes (Signed)
Subjective:     Patient ID: Sharon Bond , female    DOB: [birth year] , 57 y.o.   MRN: [id number]   Chief Complaint  Patient presents with   Otalgia    Right, recent OM in same ear    HPI  Here due to continued ear pain. The discomfort to her ear improved but restarted where if felt like there is something in her ear. Feels like when you go under water but when she is walking. She reports the light hurting her eyes when driving. She is due to see her eye doctor in the next few months.   She feels like her face is twitching.   Wt Readings from Last 3 Encounters: 02/25/22 : 230 lb (104.3 kg) 02/12/22 : 230 lb 9.6 oz (104.6 kg) 01/10/22 : 227 lb (103 kg)    Otalgia  There is pain in the right ear. This is a recurrent problem. The current episode started more than 1 month ago. There has been no fever. The patient is experiencing no pain. Associated symptoms include headaches. Pertinent negatives include no abdominal pain, ear discharge or hearing loss. She has tried nothing for the symptoms. There is no history of hearing loss.     Past Medical History:  Diagnosis Date   Allergy    pollen   Anxiety 2022   Eczema    GERD (gastroesophageal reflux disease)    Hypertension    IUD (intrauterine device) in place 2005   removed 2015   Mixed incontinence    Seasonal allergic rhinitis    Sleep apnea 10/2020   11/16/20--has not rec'd cpap yet   Wears glasses      Family History  Problem Relation Age of Onset   Diabetes Mother    Hypertension Mother    Alzheimer's disease Mother    Healthy Father    Diabetes Brother    Heart disease Maternal Aunt    Healthy Son    Healthy Son    Hypertension Daughter    Healthy Daughter    Cancer Neg Hx    Stroke Neg Hx    Sleep apnea Neg Hx    Colon cancer Neg Hx    Colon polyps Neg Hx    Esophageal cancer Neg Hx    Rectal cancer Neg Hx    Stomach cancer Neg Hx      Current Outpatient Medications:    albuterol (VENTOLIN HFA)  108 (90 Base) MCG/ACT inhaler, Inhale 2 puffs into the lungs every 6 (six) hours as needed for wheezing or shortness of breath., Disp: 8 g, Rfl: 2   busPIRone (BUSPAR) 10 MG tablet, TAKE 1 TABLET BY MOUTH EVERY DAY, Disp: 90 tablet, Rfl: 2   cetirizine (ZYRTEC ALLERGY) 10 MG tablet, Take 1 tablet (10 mg total) by mouth daily., Disp: 30 tablet, Rfl: 2   diclofenac Sodium (VOLTAREN) 1 % GEL, APPLY 2 GRAMS TO AFFECTED AREA 4 TIMES A DAY, Disp: , Rfl:    diltiazem (CARDIZEM) 30 MG tablet, Take 1 tablet (30 mg total) by mouth in the morning and at bedtime., Disp: 180 tablet, Rfl: 1   EPINEPHrine 0.3 mg/0.3 mL IJ SOAJ injection, Inject 0.3 mg into the muscle as needed for anaphylaxis., Disp: 1 each, Rfl: 1   fluticasone (FLONASE) 50 MCG/ACT nasal spray, Place 1 spray into both nostrils daily., Disp: 11.1 mL, Rfl: 2   Lactobacillus (PROBIOTIC ACIDOPHILUS PO), Take by mouth as needed., Disp: , Rfl:    Nebivolol  HCl (BYSTOLIC) 20 MG TABS, Take 1 tablet (20 mg total) by mouth daily., Disp: 90 tablet, Rfl: 1   omeprazole (PRILOSEC OTC) 20 MG tablet, Take 1 tablet (20 mg total) by mouth daily. (Patient taking differently: Take 20 mg by mouth as needed.), Disp: 30 tablet, Rfl: 1   spironolactone (ALDACTONE) 25 MG tablet, Take 1 tablet (25 mg total) by mouth daily., Disp: 90 tablet, Rfl: 1   traZODone (DESYREL) 50 MG tablet, TAKE 1/2 TO 1 TABLET BY MOUTH AT BEDTIME AS NEEDED FOR SLEEP, Disp: 60 tablet, Rfl: 0   triamcinolone cream (KENALOG) 0.1 %, Apply 1 application. topically as needed. Pt using at least 4 times a day, Disp: 30 g, Rfl: 2   valsartan (DIOVAN) 320 MG tablet, Take 1 tablet (320 mg total) by mouth daily., Disp: 90 tablet, Rfl: 1   Allergies  Allergen Reactions   Olmesartan     "cough"   Amlodipine Cough   Penicillins Rash and Other (See Comments)    Hives, rash. No airway symptoms      Review of Systems  HENT:  Positive for ear pain. Negative for ear discharge, hearing loss and sinus pain.    Eyes:  Positive for photophobia.  Gastrointestinal:  Negative for abdominal pain.  Neurological:  Positive for headaches.     Today's Vitals   02/25/22 1200  BP: 138/82  Pulse: 64  Temp: 98.3 F (36.8 C)  TempSrc: Oral  SpO2: 98%  Weight: 230 lb (104.3 kg)   Body mass index is 42.07 kg/m.   Objective:  Physical Exam Vitals reviewed.  Constitutional:      General: She is not in acute distress.    Appearance: Normal appearance. She is obese.  Cardiovascular:     Rate and Rhythm: Normal rate and regular rhythm.     Pulses: Normal pulses.     Heart sounds: Normal heart sounds. No murmur heard. Pulmonary:     Effort: Pulmonary effort is normal. No respiratory distress.     Breath sounds: Normal breath sounds. No wheezing.  Neurological:     General: No focal deficit present.     Mental Status: She is alert and oriented to person, place, and time.     Cranial Nerves: No cranial nerve deficit.     Motor: No weakness.         Assessment And Plan:     1. Right ear pain Comments: No abnormal findings on physical exam except pain with movement of Pinna upwards. Since has been persistent will refer to ENT for further evaluation - Ambulatory referral to ENT - [place] (5MM); Future  2. New onset of headaches after age 29 Comments: This is in conjunction with her right ear pain. Will order CT scan of brain for further evaluation. No significant change in her smile compared to license. - CT HEAD WO CONTRAST (5MM); Future  3. Temporal pain Comments: Tenderness to right temporal area she had a sed rate done was 33 down from 55  4. Obesity, Class III, BMI 40-49.9 (morbid obesity) ([place])     Patient was given opportunity to ask questions. Patient verbalized understanding of the plan and was able to repeat key elements of the plan. All questions were answered to their satisfaction.  [name], FNP   I, [name], FNP, have reviewed all documentation for this  visit. The documentation on 02/25/22 for the exam, diagnosis, procedures, and orders are all accurate and complete.   IF YOU  HAVE BEEN REFERRED TO A SPECIALIST, IT MAY TAKE 1-2 WEEKS TO SCHEDULE/PROCESS THE REFERRAL. IF YOU HAVE NOT HEARD FROM US/SPECIALIST IN TWO WEEKS, PLEASE GIVE [place] A CALL AT [phone].   THE PATIENT IS ENCOURAGED TO PRACTICE SOCIAL DISTANCING DUE TO THE COVID-19 PANDEMIC.

## 2022-02-26 ENCOUNTER — Ambulatory Visit (INDEPENDENT_AMBULATORY_CARE_PROVIDER_SITE_OTHER): Payer: Medicaid Other

## 2022-02-26 ENCOUNTER — Ambulatory Visit: Payer: Medicaid Other

## 2022-02-26 ENCOUNTER — Ambulatory Visit: Payer: Medicaid Other | Admitting: Podiatry

## 2022-02-26 DIAGNOSIS — M25571 Pain in right ankle and joints of right foot: Secondary | ICD-10-CM

## 2022-02-26 DIAGNOSIS — M722 Plantar fascial fibromatosis: Secondary | ICD-10-CM | POA: Diagnosis not present

## 2022-02-26 DIAGNOSIS — M7752 Other enthesopathy of left foot: Secondary | ICD-10-CM

## 2022-02-26 DIAGNOSIS — M79605 Pain in left leg: Secondary | ICD-10-CM | POA: Diagnosis not present

## 2022-02-26 DIAGNOSIS — M25572 Pain in left ankle and joints of left foot: Secondary | ICD-10-CM

## 2022-02-26 MED ORDER — METHYLPREDNISOLONE 4 MG PO TBPK: ORAL_TABLET | ORAL | 0 refills | Status: DC

## 2022-02-26 NOTE — Progress Notes (Signed)
Subjective: Chief Complaint  Patient presents with   Ankle Pain    Left, pain is becoming worse, pain is located in the ankle and heel of foot, patient thinks that the pain could be from a fall she sustained last year     57 year old female presents the office for above concerns.  It felt like a nail on the bottom on the foot. She used a frozen water bottle which helped at first but not now. This started in October. Now it hurts to stand. It aches and feels like it is swollen but it is not. She has also tried heating pad, soaking in epsom salts, OTC NSAIDs, and chaing shoes.  She had a fall in August. This pain started in October.   Objective: AAO x3, NAD DP/PT pulses palpable bilaterally, CRT less than 3 seconds There is tenderness palpation on the plantar aspect of the heel and the insertion of plantar fascia on the left foot.  There is tenderness on the flexor tendons as well into the ankle.  Clinically the tendons appear to be intact.  There is no area pinpoint tenderness. She does endorse calf tightness however does appear to be supple there is no erythema or warmth.  Assessment: Planter fasciitis, tendinitis ankle  Plan: -All treatment options discussed with the patient including all alternatives, risks, complications.  -X-rays were obtained and reviewed with the patient.  3 views of the ankle were obtained.  No evidence of acute fracture. -Calf has been tight for months, will still check duplex. -Given her pain will immobilization in a boot which is dispensed today. -Medrol dose pack -Ice/elevation -Patient encouraged to call the office with any questions, concerns, change in symptoms.   Trula Slade DPM

## 2022-02-26 NOTE — Patient Instructions (Signed)
Walking Boot, Adult  A walking boot holds your foot or ankle in place after an injury or a medical procedure. This helps with healing and prevents further injury. It has a hard, rigid outer frame that limits movement and supports your foot and leg. The inner lining is a layer of padded material. Walking boots also have adjustable straps to secure them over the foot and leg. A walking boot may be prescribed if you can put weight (bear weight) on your injured foot. How much you can walk while wearing the boot will depend on the type and severity of your injury. How to put on a walking boot There are different types of walking boots. Each type has specific instructions about how to wear it properly. Follow instructions from your health care provider, such as: Ask someone to help you put on the boot, if needed. Sit to put on your boot. Doing this is more comfortable and helps to prevent falls. Open up the boot fully. Place your foot into the boot so your heel rests against the back. Your toes should be supported by the base of the boot. They should not hang over the front edge. Adjust the straps so the boot fits securely but is not too tight. Do not bend the hard frame of the boot to get a good fit. How to walk with a walking boot How much you can walk will depend on your injury. Some tips for managing with a boot include: Do not try to walk without wearing the boot unless your health care provider approves. Use other assistive walking devices, including crutches or canes, as told by your health care provider. On your uninjured foot, wear a shoe with a heel that is close to the height of the walking boot. Be careful when walking on surfaces that are uneven or wet. How to reduce swelling while using a walking boot  Rest your injured foot or leg as much as possible. If directed, put ice on the injured area. To do this: Put ice in a plastic bag. Place a towel between your skin and the bag. Leave the  ice on for 20 minutes, 2-3 times a day. Remove the ice if your skin turns bright red. This is very important. If you cannot feel pain, heat, or cold, you have a greater risk of damage to the area. Keep your injured foot or leg raised (elevated) above the level of your heart for at least 2?3 hours each day or as told by your health care provider. If swelling gets worse, loosen the boot. Rest and elevate your foot and leg. How to care for your skin and foot while using a walking boot Wear a long sock to protect your foot and leg from rubbing inside the boot. Take off the boot one time each day to check the injured area. Check your foot, the surrounding skin, and your leg to make sure there are no sores, rashes, swelling, or wounds. The skin should be a healthy color, not pale or blue. Try to notice if your walking pattern (gait) in the boot is fairly normal and that you are walking without a noticeable limp. Follow instructions from your health care provider about taking care of your incision or wound, if this applies. Clean and wash the injured area as told by your health care provider. Gently dry your foot and leg before putting the boot back on. Removing your walking boot Follow directions from your health care provider for removing the  walking boot. Generally, it is okay to remove your walking boot: When you are resting or sleeping. To clean your foot and leg. How to keep the walking boot clean Do not put any part of the boot in a washing machine or dryer. Do not use chemical cleaning products. These could irritate your skin, especially if you have a wound or an incision. Do not soak the liner of the boot. Use a washcloth with mild soap and water to clean the frame and the liner of the boot by hand. Allow the boot to air-dry completely before you put it back on your foot. Follow these instructions at home: Activity Your activity will be restricted depending on the type and severity of your  injury. Follow instructions from your health care provider. Also: Bathe and shower as told by your health care provider. Do not do any activities that could make your injury worse. Do not drive if your affected foot is the one that you use for driving. Contact a health care provider if: The boot is cracked or damaged. The boot does not fit properly. Your foot or leg hurts. You have a rash, sore, or open sore (ulcer) on your foot or leg. The skin on your foot or leg is pale. You have a wound or incision on the foot and it is getting worse. Your skin becomes painful, red, or irritated. Your swelling does not get better or it gets worse. Get help right away if: You have numbness in your foot or leg. The skin on your foot or leg is cold, blue, or gray. Summary A walking boot holds your foot or ankle in place after an injury or a medical procedure. There are different types of walking boots. Follow instructions about how to correctly wear your boot. Ask someone to help you put on the boot, if needed. It is important to check your skin and foot every day. Call your health care provider if you notice a rash or sore on your foot or leg. This information is not intended to replace advice given to you by your health care provider. Make sure you discuss any questions you have with your health care provider. Document Revised: 11/01/2019 Document Reviewed: 11/01/2019 [employer] Patient Education  [place].    Plantar Fasciitis (Heel Spur Syndrome) with Rehab The plantar fascia is a fibrous, ligament-like, soft-tissue structure that spans the bottom of the foot. Plantar fasciitis is a condition that causes pain in the foot due to inflammation of the tissue. SYMPTOMS  Pain and tenderness on the underneath side of the foot. Pain that worsens with standing or walking. CAUSES  Plantar fasciitis is caused by irritation and injury to the plantar fascia on the underneath side of the foot. Common  mechanisms of injury include: Direct trauma to bottom of the foot. Damage to a small nerve that runs under the foot where the main fascia attaches to the heel bone. Stress placed on the plantar fascia due to bone spurs. RISK INCREASES WITH:  Activities that place stress on the plantar fascia (running, jumping, pivoting, or cutting). Poor strength and flexibility. Improperly fitted shoes. Tight calf muscles. Flat feet. Failure to warm-up properly before activity. Obesity. PREVENTION Warm up and stretch properly before activity. Allow for adequate recovery between workouts. Maintain physical fitness: Strength, flexibility, and endurance. Cardiovascular fitness. Maintain a health body weight. Avoid stress on the plantar fascia. Wear properly fitted shoes, including arch supports for individuals who have flat feet.  PROGNOSIS  If  treated properly, then the symptoms of plantar fasciitis usually resolve without surgery. However, occasionally surgery is necessary.  RELATED COMPLICATIONS  Recurrent symptoms that may result in a chronic condition. Problems of the lower back that are caused by compensating for the injury, such as limping. Pain or weakness of the foot during push-off following surgery. Chronic inflammation, scarring, and partial or complete fascia tear, occurring more often from repeated injections.  TREATMENT  Treatment initially involves the use of ice and medication to help reduce pain and inflammation. The use of strengthening and stretching exercises may help reduce pain with activity, especially stretches of the Achilles tendon. These exercises may be performed at home or with a therapist. Your caregiver may recommend that you use heel cups of arch supports to help reduce stress on the plantar fascia. Occasionally, corticosteroid injections are given to reduce inflammation. If symptoms persist for greater than 6 months despite non-surgical (conservative), then surgery may  be recommended.   MEDICATION  If pain medication is necessary, then nonsteroidal anti-inflammatory medications, such as aspirin and ibuprofen, or other minor pain relievers, such as acetaminophen, are often recommended. Do not take pain medication within 7 days before surgery. Prescription pain relievers may be given if deemed necessary by your caregiver. Use only as directed and only as much as you need. Corticosteroid injections may be given by your caregiver. These injections should be reserved for the most serious cases, because they may only be given a certain number of times.  HEAT AND COLD Cold treatment (icing) relieves pain and reduces inflammation. Cold treatment should be applied for 10 to 15 minutes every 2 to 3 hours for inflammation and pain and immediately after any activity that aggravates your symptoms. Use ice packs or massage the area with a piece of ice (ice massage). Heat treatment may be used prior to performing the stretching and strengthening activities prescribed by your caregiver, physical therapist, or athletic trainer. Use a heat pack or soak the injury in warm water.  SEEK IMMEDIATE MEDICAL CARE IF: Treatment seems to offer no benefit, or the condition worsens. Any medications produce adverse side effects.  EXERCISES- RANGE OF MOTION (ROM) AND STRETCHING EXERCISES - Plantar Fasciitis (Heel Spur Syndrome) These exercises may help you when beginning to rehabilitate your injury. Your symptoms may resolve with or without further involvement from your physician, physical therapist or athletic trainer. While completing these exercises, remember:  Restoring tissue flexibility helps normal motion to return to the joints. This allows healthier, less painful movement and activity. An effective stretch should be held for at least 30 seconds. A stretch should never be painful. You should only feel a gentle lengthening or release in the stretched tissue.  RANGE OF MOTION - Toe  Extension, Flexion Sit with your right / left leg crossed over your opposite knee. Grasp your toes and gently pull them back toward the top of your foot. You should feel a stretch on the bottom of your toes and/or foot. Hold this stretch for 10 seconds. Now, gently pull your toes toward the bottom of your foot. You should feel a stretch on the top of your toes and or foot. Hold this stretch for 10 seconds. Repeat  times. Complete this stretch 3 times per day.   RANGE OF MOTION - Ankle Dorsiflexion, Active Assisted Remove shoes and sit on a chair that is preferably not on a carpeted surface. Place right / left foot under knee. Extend your opposite leg for support. Keeping your heel down, slide  your right / left foot back toward the chair until you feel a stretch at your ankle or calf. If you do not feel a stretch, slide your bottom forward to the edge of the chair, while still keeping your heel down. Hold this stretch for 10 seconds. Repeat 3 times. Complete this stretch 2 times per day.   STRETCH  Gastroc, Standing Place hands on wall. Extend right / left leg, keeping the front knee somewhat bent. Slightly point your toes inward on your back foot. Keeping your right / left heel on the floor and your knee straight, shift your weight toward the wall, not allowing your back to arch. You should feel a gentle stretch in the right / left calf. Hold this position for 10 seconds. Repeat 3 times. Complete this stretch 2 times per day.  STRETCH  Soleus, Standing Place hands on wall. Extend right / left leg, keeping the other knee somewhat bent. Slightly point your toes inward on your back foot. Keep your right / left heel on the floor, bend your back knee, and slightly shift your weight over the back leg so that you feel a gentle stretch deep in your back calf. Hold this position for 10 seconds. Repeat 3 times. Complete this stretch 2 times per day.  STRETCH  Gastrocsoleus, Standing  Note: This  exercise can place a lot of stress on your foot and ankle. Please complete this exercise only if specifically instructed by your caregiver.  Place the ball of your right / left foot on a step, keeping your other foot firmly on the same step. Hold on to the wall or a rail for balance. Slowly lift your other foot, allowing your body weight to press your heel down over the edge of the step. You should feel a stretch in your right / left calf. Hold this position for 10 seconds. Repeat this exercise with a slight bend in your right / left knee. Repeat 3 times. Complete this stretch 2 times per day.   STRENGTHENING EXERCISES - Plantar Fasciitis (Heel Spur Syndrome)  These exercises may help you when beginning to rehabilitate your injury. They may resolve your symptoms with or without further involvement from your physician, physical therapist or athletic trainer. While completing these exercises, remember:  Muscles can gain both the endurance and the strength needed for everyday activities through controlled exercises. Complete these exercises as instructed by your physician, physical therapist or athletic trainer. Progress the resistance and repetitions only as guided.  STRENGTH - Towel Curls Sit in a chair positioned on a non-carpeted surface. Place your foot on a towel, keeping your heel on the floor. Pull the towel toward your heel by only curling your toes. Keep your heel on the floor. Repeat 3 times. Complete this exercise 2 times per day.  STRENGTH - Ankle Inversion Secure one end of a rubber exercise band/tubing to a fixed object (table, pole). Loop the other end around your foot just before your toes. Place your fists between your knees. This will focus your strengthening at your ankle. Slowly, pull your big toe up and in, making sure the band/tubing is positioned to resist the entire motion. Hold this position for 10 seconds. Have your muscles resist the band/tubing as it slowly pulls  your foot back to the starting position. Repeat 3 times. Complete this exercises 2 times per day.  Document Released: 01/07/2005 Document Revised: 04/01/2011 Document Reviewed: 04/21/2008 [facility] Patient Information 2014 [place], [place].

## 2022-02-27 ENCOUNTER — Ambulatory Visit (HOSPITAL_COMMUNITY)
Admission: RE | Admit: 2022-02-27 | Discharge: 2022-02-27 | Disposition: A | Discharge status: Home / Self Care | Payer: Medicaid Other | Source: Ambulatory Visit | Attending: Cardiovascular Disease | Admitting: Cardiovascular Disease

## 2022-02-27 DIAGNOSIS — M79605 Pain in left leg: Secondary | ICD-10-CM | POA: Insufficient documentation

## 2022-02-28 ENCOUNTER — Encounter: Payer: Self-pay | Admitting: Nurse Practitioner

## 2022-03-06 ENCOUNTER — Ambulatory Visit: Payer: Medicaid Other | Attending: Rheumatology | Admitting: Rheumatology

## 2022-03-06 ENCOUNTER — Encounter: Payer: Self-pay | Admitting: Rheumatology

## 2022-03-06 VITALS — BP 134/81 | HR 57 | Resp 20 | Ht 62.0 in | Wt 233.0 lb

## 2022-03-06 DIAGNOSIS — I10 Essential (primary) hypertension: Secondary | ICD-10-CM

## 2022-03-06 DIAGNOSIS — M7711 Lateral epicondylitis, right elbow: Secondary | ICD-10-CM

## 2022-03-06 DIAGNOSIS — R768 Other specified abnormal immunological findings in serum: Secondary | ICD-10-CM

## 2022-03-06 DIAGNOSIS — M5136 Other intervertebral disc degeneration, lumbar region: Secondary | ICD-10-CM

## 2022-03-06 DIAGNOSIS — G4733 Obstructive sleep apnea (adult) (pediatric): Secondary | ICD-10-CM

## 2022-03-06 DIAGNOSIS — Z8659 Personal history of other mental and behavioral disorders: Secondary | ICD-10-CM

## 2022-03-06 DIAGNOSIS — M25572 Pain in left ankle and joints of left foot: Secondary | ICD-10-CM

## 2022-03-06 DIAGNOSIS — G8929 Other chronic pain: Secondary | ICD-10-CM

## 2022-03-06 DIAGNOSIS — Z8719 Personal history of other diseases of the digestive system: Secondary | ICD-10-CM

## 2022-03-06 DIAGNOSIS — M797 Fibromyalgia: Secondary | ICD-10-CM

## 2022-03-06 DIAGNOSIS — M25571 Pain in right ankle and joints of right foot: Secondary | ICD-10-CM

## 2022-03-06 DIAGNOSIS — M25552 Pain in left hip: Secondary | ICD-10-CM | POA: Diagnosis not present

## 2022-03-06 DIAGNOSIS — Z780 Asymptomatic menopausal state: Secondary | ICD-10-CM

## 2022-03-06 DIAGNOSIS — M79641 Pain in right hand: Secondary | ICD-10-CM | POA: Diagnosis not present

## 2022-03-06 DIAGNOSIS — L2084 Intrinsic (allergic) eczema: Secondary | ICD-10-CM

## 2022-03-06 DIAGNOSIS — G4701 Insomnia due to medical condition: Secondary | ICD-10-CM

## 2022-03-06 DIAGNOSIS — M25562 Pain in left knee: Secondary | ICD-10-CM

## 2022-03-06 DIAGNOSIS — Z13820 Encounter for screening for osteoporosis: Secondary | ICD-10-CM

## 2022-03-06 NOTE — Patient Instructions (Signed)
Back Exercises The following exercises strengthen the muscles that help to support the trunk (torso) and back. They also help to keep the lower back flexible. Doing these exercises can help to prevent or lessen existing low back pain. If you have back pain or discomfort, try doing these exercises 2-3 times each day or as told by your health care provider. As your pain improves, do them once each day, but increase the number of times that you repeat the steps for each exercise (do more repetitions). To prevent the recurrence of back pain, continue to do these exercises once each day or as told by your health care provider. Do exercises exactly as told by your health care provider and adjust them as directed. It is normal to feel mild stretching, pulling, tightness, or discomfort as you do these exercises, but you should stop right away if you feel sudden pain or your pain gets worse. Exercises Single knee to chest Repeat these steps 3-5 times for each leg: Lie on your back on a firm bed or the floor with your legs extended. Bring one knee to your chest. Your other leg should stay extended and in contact with the floor. Hold your knee in place by grabbing your knee or thigh with both hands and hold. Pull on your knee until you feel a gentle stretch in your lower back or buttocks. Hold the stretch for 10-30 seconds. Slowly release and straighten your leg.  Pelvic tilt Repeat these steps 5-10 times: Lie on your back on a firm bed or the floor with your legs extended. Bend your knees so they are pointing toward the ceiling and your feet are flat on the floor. Tighten your lower abdominal muscles to press your lower back against the floor. This motion will tilt your pelvis so your tailbone points up toward the ceiling instead of pointing to your feet or the floor. With gentle tension and even breathing, hold this position for 5-10 seconds.  Cat-cow Repeat these steps until your lower back becomes  more flexible: Get into a hands-and-knees position on a firm bed or the floor. Keep your hands under your shoulders, and keep your knees under your hips. You may place padding under your knees for comfort. Let your head hang down toward your chest. Contract your abdominal muscles and point your tailbone toward the floor so your lower back becomes rounded like the back of a cat. Hold this position for 5 seconds. Slowly lift your head, let your abdominal muscles relax, and point your tailbone up toward the ceiling so your back forms a sagging arch like the back of a cow. Hold this position for 5 seconds.  Press-ups Repeat these steps 5-10 times: Lie on your abdomen (face-down) on a firm bed or the floor. Place your palms near your head, about shoulder-width apart. Keeping your back as relaxed as possible and keeping your hips on the floor, slowly straighten your arms to raise the top half of your body and lift your shoulders. Do not use your back muscles to raise your upper torso. You may adjust the placement of your hands to make yourself more comfortable. Hold this position for 5 seconds while you keep your back relaxed. Slowly return to lying flat on the floor.  Bridges Repeat these steps 10 times: Lie on your back on a firm bed or the floor. Bend your knees so they are pointing toward the ceiling and your feet are flat on the floor. Your arms should be flat   at your sides, next to your body. Tighten your buttocks muscles and lift your buttocks off the floor until your waist is at almost the same height as your knees. You should feel the muscles working in your buttocks and the back of your thighs. If you do not feel these muscles, slide your feet 1-2 inches (2.5-5 cm) farther away from your buttocks. Hold this position for 3-5 seconds. Slowly lower your hips to the starting position, and allow your buttocks muscles to relax completely. If this exercise is too easy, try doing it with your arms  crossed over your chest. Abdominal crunches Repeat these steps 5-10 times: Lie on your back on a firm bed or the floor with your legs extended. Bend your knees so they are pointing toward the ceiling and your feet are flat on the floor. Cross your arms over your chest. Tip your chin slightly toward your chest without bending your neck. Tighten your abdominal muscles and slowly raise your torso high enough to lift your shoulder blades a tiny bit off the floor. Avoid raising your torso higher than that because it can put too much stress on your lower back and does not help to strengthen your abdominal muscles. Slowly return to your starting position.  Back lifts Repeat these steps 5-10 times: Lie on your abdomen (face-down) with your arms at your sides, and rest your forehead on the floor. Tighten the muscles in your legs and your buttocks. Slowly lift your chest off the floor while you keep your hips pressed to the floor. Keep the back of your head in line with the curve in your back. Your eyes should be looking at the floor. Hold this position for 3-5 seconds. Slowly return to your starting position.  Contact a health care provider if: Your back pain or discomfort gets much worse when you do an exercise. Your worsening back pain or discomfort does not lessen within 2 hours after you exercise. If you have any of these problems, stop doing these exercises right away. Do not do them again unless your health care provider says that you can. Get help right away if: You develop sudden, severe back pain. If this happens, stop doing the exercises right away. Do not do them again unless your health care provider says that you can. This information is not intended to replace advice given to you by your health care provider. Make sure you discuss any questions you have with your health care provider. Document Revised: 07/04/2020 Document Reviewed: 03/22/2020 Elsevier Patient Education  [place].  Elbow and Forearm Exercises Ask your health care provider which exercises are safe for you. Do exercises exactly as told by your health care provider and adjust them as directed. It is normal to feel mild stretching, pulling, tightness, or discomfort as you do these exercises. Stop right away if you feel sudden pain or your pain gets worse. Do not begin these exercises until told by your health care provider. Range-of-motion exercises These exercises warm up your muscles and joints and improve the movement and flexibility of your injured elbow and forearm. The exercises also help to relieve pain, numbness, and tingling. These exercises are done using the muscles in your injured elbow and forearm (active). Elbow flexion, active Hold your left / right arm at your side, and bend your elbow (flexion) as far as you can using only your arm muscles. Hold this position for __________ seconds. Slowly return to the starting position. Repeat __________ times. Complete  this exercise __________ times a day. Elbow extension, active Hold your left / right arm at your side, and straighten your elbow (extension) as much as you can using only your arm muscles. Hold this position for __________ seconds. Slowly return to the starting position. Repeat __________ times. Complete this exercise __________ times a day. Active forearm rotation, supination This is an exercise in which you turn (rotate) your forearm palm up (supination). Stand or sit with your elbows at your sides. Bend your left / right elbow to a 90-degree angle (right angle). Rotate your palm up until you feel a gentle stretch on the inside of your forearm. Hold this position for __________ seconds. Slowly return to the starting position. Repeat __________ times. Complete this exercise __________ times a day. Active forearm rotation, pronation This is an exercise in which you turn (rotate) your forearm palm down (pronation). Stand or sit with  your elbows at your sides. Bend your left / right elbow to a 90-degree angle (right angle). Rotate your palm down until you feel a gentle stretch on the top of your forearm. Hold this position for __________ seconds. Slowly return to the starting position. Repeat __________ times. Complete this exercise __________ times a day. Stretching exercises These exercises warm up your muscles and joints and improve the movement and flexibility of your injured elbow and forearm. These exercises also help to relieve pain, numbness, and tingling. These exercises are done using your healthy elbow and forearm to help stretch the muscles in your injured elbow and forearm (active-assisted). Elbow flexion, active-assisted  Hold your left / right arm at your side, and bend your elbow (flexion) as much as you can using your left / right arm muscles. Use your other hand to bend your left / right elbow farther. To do this, gently push up on your forearm until you feel a gentle stretch on the back of your elbow. Hold this position for __________ seconds. Slowly return to the starting position. Repeat __________ times. Complete this exercise __________ times a day. Elbow extension, active-assisted  Hold your left / right arm at your side, and straighten your elbow (extension) as much as you can using your left / right arm muscles. Use your other hand to straighten the left / right elbow farther. To do this, gently push down on your forearm until you feel a gentle stretch on the inside of your elbow. Hold this position for __________ seconds. Slowly return to the starting position. Repeat __________ times. Complete this exercise __________ times a day. Active-assisted forearm rotation, supination This is an exercise in which you turn (rotate) your forearm palm up (supination). Sit with your left / right elbow bent in a 90-degree angle (right angle) with your forearm resting on a table. Keeping your upper body and  shoulder still, rotate your forearm so your palm faces upward. Use your other hand to help rotate your forearm further until you feel a gentle to moderate stretch. Hold this position for __________ seconds. Slowly release the stretch and return to the starting position. Repeat __________ times. Complete this exercise __________ times a day. Active-assisted forearm rotation, pronation This is an exercise in which you turn (rotate) your forearm palm down (pronation). Sit with your left / right elbow bent in a 90-degree angle (right angle) with your forearm resting on a table. Keeping your upper body and shoulder still, rotate your forearm so your palm faces the tabletop. Use your other hand to help rotate your forearm further until you  feel a gentle to moderate stretch. Hold this position for __________ seconds. Slowly release the stretch and return to the starting position. Repeat __________ times. Complete this exercise __________ times a day. Passive elbow flexion, supine Lie on your back (supine position). Extend your left / right arm up in the air, bracing it with your other hand. Let your left / right hand slowly lower toward your shoulder (passive flexion), while your elbow stays pointed toward the ceiling. You should feel a gentle stretch along the back of your upper arm and elbow. If instructed by your health care provider, you may increase the intensity of your stretch by adding a small wrist weight or hand weight. Hold this position for __________ seconds. Slowly return to the starting position. Repeat __________ times. Complete this exercise __________ times a day. Passive elbow extension, supine  Lie on your back (supine position). Make sure that you are in a comfortable position that lets you relax your arm muscles. Place a folded towel under your left / right upper arm so your elbow and shoulder are at the same height. Straighten your left / right arm so your elbow does not rest  on the bed or towel. Let the weight of your hand stretch your elbow (passive extension). Keep your arm and chest muscles relaxed. You should feel a stretch on the inside of your elbow. If told by your health care provider, you may increase the intensity of your stretch by adding a small wrist weight or hand weight. Hold this position for __________ seconds. Slowly release the stretch. Repeat __________ times. Complete this exercise __________ times a day. Strengthening exercises These exercises build strength and endurance in your elbow and forearm. Endurance is the ability to use your muscles for a long time, even after they get tired. Elbow flexion, isometric  Stand or sit up straight. Bend your left / right elbow in a 90-degree angle (right angle), and keep your forearm at the height of your waist. Your thumb should be pointed toward the ceiling (neutral forearm). Place your other hand on top of your left / right forearm. Gently push down while you resist with your left / right arm (isometric flexion). Push as hard as you can with both arms without causing any pain or movement at your left / right elbow. Hold this position for __________ seconds. Slowly release the tension in both arms. Let your muscles relax completely before you repeat the exercise. Repeat __________ times. Complete this exercise __________ times a day. Elbow extension, isometric  Stand or sit up straight. Place your left / right arm so your palm faces your abdomen and is at the height of your waist. Place your other hand on the underside of your left / right forearm. Gently push up while you resist with your left / right arm (isometric extension). Push as hard as you can with both arms without causing any pain or movement at your left / right elbow. Hold this position for __________ seconds. Slowly release the tension in both arms. Let your muscles relax completely before you repeat the exercise. Repeat __________ times.  Complete this exercise __________ times a day. Elbow flexion with forearm palm up  Sit on a firm chair without armrests, or stand up. Place your left / right arm at your side with your elbow straight and your palm facing forward. Holding a __________weight or gripping a rubber exercise band or tubing, bend your elbow to bring your hand toward your shoulder (flexion). Hold this  position for __________ seconds. Slowly return to the starting position. Repeat __________ times. Complete this exercise __________ times a day. Elbow extension, active  Sit on a firm chair without armrests, or stand up. Hold a rubber exercise band or tubing in both hands. Keeping your upper arms at your sides, bring both hands up to your left / right shoulder. Keep your left / right hand just below your other hand. Straighten your left / right elbow (extension) while keeping your other arm still. Hold this position for __________ seconds. Control the resistance of the band or tubing as you return to the starting position. Repeat __________ times. Complete this exercise __________ times a day. Forearm rotation, supination  Sit with your left / right forearm supported on a table. Your elbow should be at waist height and bent at a 90-degree angle (right angle). Gently grasp a lightweight hammer. Rest your hand over the edge of the table with your palm facing down. Without moving your left / right elbow, slowly rotate your forearm to turn your palm up toward the ceiling (supination). Hold this position for __________ seconds. Slowly return to the starting position. Repeat __________ times. Complete this exercise __________ times a day. Forearm rotation, pronation  Sit with your left / right forearm supported on a table. Keep your elbow below shoulder height. Gently grasp a lightweight hammer. Rest your hand over the edge of the table with your palm facing up. Without moving your left / right elbow, slowly rotate  your forearm to turn your palm down toward the floor (pronation). Hold this position for __________seconds. Slowly return to the starting position. Repeat __________ times. Complete this exercise __________ times a day. This information is not intended to replace advice given to you by your health care provider. Make sure you discuss any questions you have with your health care provider. Document Revised: 04/30/2018 Document Reviewed: 01/28/2018 Elsevier Patient Education  [place].

## 2022-03-07 ENCOUNTER — Encounter (HOSPITAL_COMMUNITY): Payer: Self-pay

## 2022-03-07 ENCOUNTER — Ambulatory Visit (HOSPITAL_COMMUNITY)
Admission: EM | Admit: 2022-03-07 | Discharge: 2022-03-07 | Disposition: A | Discharge status: Home / Self Care | Payer: Medicaid Other | Attending: Emergency Medicine | Admitting: Emergency Medicine

## 2022-03-07 DIAGNOSIS — H60541 Acute eczematoid otitis externa, right ear: Secondary | ICD-10-CM

## 2022-03-07 MED ORDER — FLUOCINOLONE ACETONIDE 0.01 % OT OIL: 5.0000 [drp] | TOPICAL_OIL | Freq: Two times a day (BID) | OTIC | 0 refills | Status: DC | PRN

## 2022-03-07 MED ORDER — CETIRIZINE HCL 10 MG PO TABS: 10.0000 mg | ORAL_TABLET | Freq: Every day | ORAL | 1 refills | Status: AC

## 2022-03-07 MED ORDER — FLUTICASONE PROPIONATE 50 MCG/ACT NA SUSP: 1.0000 | Freq: Every day | NASAL | 1 refills | Status: DC

## 2022-03-07 NOTE — ED Triage Notes (Signed)
Pt states pt is here for headache, ear pain on and off x 3wks . Pt staes she has been having body ache , low energy , nausea x 3wks

## 2022-03-07 NOTE — Discharge Instructions (Signed)
Your symptoms and my physical exam findings are concerning for exacerbation of your underlying allergies.     Zyrtec (cetirizine): This is an excellent second-generation antihistamine that helps to reduce respiratory inflammatory response to environmental allergens.  In some patients, this medication can cause daytime sleepiness so I recommend that you take 1 tablet daily at bedtime.     Flonase (fluticasone): This is a steroid nasal spray that used once daily, 1 spray in each nare.  This works best when used on a daily basis. This medication does not work well if it is only used when you think you need it.  After 3 to 5 days of use, you will notice significant reduction of the inflammation and mucus production that is currently being caused by exposure to allergens, whether seasonal or environmental.  The most common side effect of this medication is nosebleeds.  If you experience a nosebleed, please discontinue use for 1 week, then feel free to resume.  If you find that your insurance will not pay for this medication, please consider a different nasal steroids such as Nasonex (mometasone), or Nasacort (triamcinolone).  Dermotic oil (fluocinolone): This is a topical steroid that you use in your ears to calm the inflammation caused by eczema.  Please instill 5 drops twice daily until you are feeling better, then you can to decrease as needed.   If symptoms have not meaningfully improved in the next 5 to 7 days, please return for repeat evaluation or follow-up with your regular provider.  If symptoms have worsened in the next 3 to 5 days, please return for repeat evaluation or follow-up with your regular provider.   Thank you for visiting urgent care today.  We appreciate the opportunity to participate in your care.

## 2022-03-07 NOTE — ED Provider Notes (Signed)
[place]    CSN: [id number] Arrival date & time: 03/07/22  1102    HISTORY   Chief Complaint  Patient presents with   Otalgia   Headache   HPI Sharon Bond is a pleasant, 57 y.o. female who presents to urgent care today. Pt states pt is here for headache, ear pain on and off x 3wks. Pt staes she has been having body ache, low energy, nausea x 3wks. patient has normal vital signs on arrival today.  Patient reports a history of allergies and eczema, not currently using any allergy medications or topical steroids.  Patient states her ear pain has actually been ongoing for the past several months, states it comes and goes.  Patient states she has been given 2 rounds of oral steroids, once for a year and wants because she broke her ankle and both times the ear pain resolved but then eventually came right back.  The history is provided by the patient.   Past Medical History:  Diagnosis Date   Allergy    pollen   Anxiety 2022   Eczema    GERD (gastroesophageal reflux disease)    Hypertension    IUD (intrauterine device) in place 2005   removed 2015   Mixed incontinence    Seasonal allergic rhinitis    Sleep apnea 10/2020   11/16/20--has not rec'd cpap yet   Wears glasses    Patient Active Problem List   Diagnosis Date Noted   Temporal pain 02/25/2022   Pain in joint of left knee 10/12/2021   Obstructive sleep apnea hypopnea, moderate 10/03/2020   Insomnia secondary to chronic pain 06/28/2020   Psychophysiological insomnia 06/28/2020   Class 3 severe obesity due to excess calories without serious comorbidity with body mass index (BMI) of 40.0 to 44.9 in adult ([place]) 06/28/2020   Snoring 06/28/2020   Pain in right arm 05/31/2019   Dysmenorrhea 08/12/2017   Dyspareunia in female 08/12/2017   Fibroid uterus 08/12/2017   Menorrhagia with regular cycle 08/12/2017   Intrinsic eczema 02/27/2017   Obesity, Class III, BMI 40-49.9 (morbid obesity) ([place]) 02/13/2017    Elevated blood pressure 10/07/2012   Hypokalemia 03/20/2006   ANXIETY 03/20/2006   Essential hypertension 03/20/2006   Rhinitis, allergic 03/20/2006   GASTROESOPHAGEAL REFLUX, NO ESOPHAGITIS 03/20/2006   FIBROMYALGIA, FIBROMYOSITIS 03/20/2006   MUSCLE CRAMPS NOS 03/20/2006   Past Surgical History:  Procedure Laterality Date   DILATATION & CURETTAGE/HYSTEROSCOPY WITH TRUECLEAR N/A 01/08/2013   Procedure: DILATATION & CURETTAGE/HYSTEROSCOPY WITH TRUCLEAR, polypectomy;  Surgeon: Claiborne Billings A. Pamala Hurry, MD;  Location: [facility];  Service: Gynecology;  Laterality: N/A;   LAPAROSCOPIC TUBAL LIGATION Bilateral 01/08/2013   Procedure: DIAGNOSTIC LAPAROSCOPY Bilateral Salpingectomy;  Surgeon: Claiborne Billings A. Pamala Hurry, MD;  Location: [facility];  Service: Gynecology;  Laterality: Bilateral;   PARTIAL HYSTERECTOMY  2019   WISDOM TOOTH EXTRACTION     OB History     Gravida  4   Para  4   Term  3   Preterm  1   AB      Living  2      SAB      IAB      Ectopic      Multiple      Live Births  2          Home Medications    Prior to Admission medications   Medication Sig Start Date End Date Taking? Authorizing Provider  albuterol (VENTOLIN HFA) 108 (90 Base) MCG/ACT inhaler  Inhale 2 puffs into the lungs every 6 (six) hours as needed for wheezing or shortness of breath. 03/23/20  Yes [name], FNP  busPIRone (BUSPAR) 10 MG tablet TAKE 1 TABLET BY MOUTH EVERY DAY 07/18/21  Yes [name], FNP  cetirizine (ZYRTEC ALLERGY) 10 MG tablet Take 1 tablet (10 mg total) by mouth daily. 12/18/21  Yes Rising, Wells Guiles, PA-C  diclofenac Sodium (VOLTAREN) 1 % GEL APPLY 2 GRAMS TO AFFECTED AREA 4 TIMES A DAY   Yes [provider]  diltiazem (CARDIZEM) 30 MG tablet Take 1 tablet (30 mg total) by mouth in the morning and at bedtime. 12/28/21  Yes Freada Bergeron, MD  EPINEPHrine 0.3 mg/0.3 mL IJ SOAJ injection Inject 0.3 mg into the muscle as needed for anaphylaxis. 03/23/20  Yes [name],  FNP  fluticasone (FLONASE) 50 MCG/ACT nasal spray Place 1 spray into both nostrils daily. 12/18/21  Yes Rising, Rebecca, PA-C  Lactobacillus (PROBIOTIC ACIDOPHILUS PO) Take by mouth as needed.   Yes [provider]  Nebivolol HCl (BYSTOLIC) 20 MG TABS Take 1 tablet (20 mg total) by mouth daily. 09/17/21  Yes Freada Bergeron, MD  spironolactone (ALDACTONE) 25 MG tablet Take 1 tablet (25 mg total) by mouth daily. 09/17/21  Yes Freada Bergeron, MD  triamcinolone cream (KENALOG) 0.1 % Apply 1 application. topically as needed. Pt using at least 4 times a day 05/09/21  Yes [name], FNP  valsartan (DIOVAN) 320 MG tablet Take 1 tablet (320 mg total) by mouth daily. 02/25/22  Yes Freada Bergeron, MD  methylPREDNISolone (MEDROL DOSEPAK) 4 MG TBPK tablet Take as directed Patient not taking: Reported on 03/06/2022 02/26/22   Trula Slade, DPM  omeprazole (PRILOSEC OTC) 20 MG tablet Take 1 tablet (20 mg total) by mouth daily. Patient taking differently: Take 20 mg by mouth as needed. 08/30/20   [name], FNP  traZODone (DESYREL) 50 MG tablet TAKE 1/2 TO 1 TABLET BY MOUTH AT BEDTIME AS NEEDED FOR SLEEP Patient not taking: Reported on 03/06/2022 11/20/21   Dohmeier, Asencion Partridge, MD    Family History Family History  Problem Relation Age of Onset   Diabetes Mother    Hypertension Mother    Alzheimer's disease Mother    Healthy Father    Diabetes Brother    Heart disease Maternal Aunt    Healthy Son    Healthy Son    Hypertension Daughter    Healthy Daughter    Cancer Neg Hx    Stroke Neg Hx    Sleep apnea Neg Hx    Colon cancer Neg Hx    Colon polyps Neg Hx    Esophageal cancer Neg Hx    Rectal cancer Neg Hx    Stomach cancer Neg Hx    Social History Social History   Tobacco Use   Smoking status: Never    Passive exposure: Past   Smokeless tobacco: Never  Vaping Use   Vaping Use: Never used  Substance Use Topics   Alcohol use: Yes    Comment: occasionally  during holidays, wine or beer   Drug use: Never   Allergies   Olmesartan, Amlodipine, and Penicillins  Review of Systems Review of Systems Pertinent findings revealed after performing a 14 point review of systems has been noted in the history of present illness.  Physical Exam Vital Signs BP 132/82 (BP Location: Right Arm)   Pulse 62   Temp 98.2 F (36.8 C) (Oral)   Resp 12  LMP 11/28/2012   SpO2 98%   No data found.  Physical Exam Vitals and nursing note reviewed.  Constitutional:      General: She is not in acute distress.    Appearance: Normal appearance. She is not ill-appearing.  HENT:     Head: Normocephalic and atraumatic.     Salivary Glands: Right salivary gland is not diffusely enlarged or tender. Left salivary gland is not diffusely enlarged or tender.     Right Ear: Ear canal and external ear normal. No drainage. A middle ear effusion is present. There is no impacted cerumen. Tympanic membrane is bulging. Tympanic membrane is not injected or erythematous.     Left Ear: Ear canal and external ear normal. No drainage. A middle ear effusion is present. There is no impacted cerumen. Tympanic membrane is bulging. Tympanic membrane is not injected or erythematous.     Ears:     Comments: Right EAC with erythema and mild edema, both TMs bulging with clear fluid    Nose: Rhinorrhea present. No nasal deformity, septal deviation, signs of injury, nasal tenderness, mucosal edema or congestion. Rhinorrhea is clear.     Right Nostril: Occlusion present. No foreign body, epistaxis or septal hematoma.     Left Nostril: Occlusion present. No foreign body, epistaxis or septal hematoma.     Right Turbinates: Enlarged, swollen and pale.     Left Turbinates: Enlarged, swollen and pale.     Right Sinus: No maxillary sinus tenderness or frontal sinus tenderness.     Left Sinus: No maxillary sinus tenderness or frontal sinus tenderness.     Mouth/Throat:     Lips: Pink. No lesions.      Mouth: Mucous membranes are moist. No oral lesions.     Pharynx: Oropharynx is clear. Uvula midline. No posterior oropharyngeal erythema or uvula swelling.     Tonsils: No tonsillar exudate. 0 on the right. 0 on the left.     Comments: Postnasal drip Eyes:     General: Lids are normal.        Right eye: No discharge.        Left eye: No discharge.     Extraocular Movements: Extraocular movements intact.     Conjunctiva/sclera: Conjunctivae normal.     Right eye: Right conjunctiva is not injected.     Left eye: Left conjunctiva is not injected.  Neck:     Trachea: Trachea and phonation normal.  Cardiovascular:     Rate and Rhythm: Normal rate and regular rhythm.     Pulses: Normal pulses.     Heart sounds: Normal heart sounds. No murmur heard.    No friction rub. No gallop.  Pulmonary:     Effort: Pulmonary effort is normal. No accessory muscle usage, prolonged expiration or respiratory distress.     Breath sounds: Normal breath sounds. No stridor, decreased air movement or transmitted upper airway sounds. No decreased breath sounds, wheezing, rhonchi or rales.  Chest:     Chest wall: No tenderness.  Musculoskeletal:        General: Normal range of motion.     Cervical back: Normal range of motion and neck supple. Normal range of motion.  Lymphadenopathy:     Cervical: No cervical adenopathy.  Skin:    General: Skin is warm and dry.     Findings: No erythema or rash.  Neurological:     General: No focal deficit present.     Mental Status: She is alert and oriented  to person, place, and time.  Psychiatric:        Mood and Affect: Mood normal.        Behavior: Behavior normal.     Visual Acuity Right Eye Distance:   Left Eye Distance:   Bilateral Distance:    Right Eye Near:   Left Eye Near:    Bilateral Near:     UC Couse / Diagnostics / Procedures:     Radiology No results found.  Procedures Procedures (including critical care time) EKG  Pending results:   Labs Reviewed - No data to display  Medications Ordered in UC: Medications - No data to display  UC Diagnoses / Final Clinical Impressions(s)   I have reviewed the triage vital signs and the nursing notes.  Pertinent labs & imaging results that were available during my care of the patient were reviewed by me and considered in my medical decision making (see chart for details).    Final diagnoses:  Eczema of right external ear   Physical exam findings concerning for eczema in her right ear canal as well as uncontrolled upper respiratory allergies.  Patient provided with fluocinolone oil to use twice daily until feeling better than decreased as needed.  Patient also provided with Zyrtec and Flonase for respiratory allergies.  Return precautions advised.  Please see discharge instructions below for details of plan of care as provided to patient. ED Prescriptions     Medication Sig Dispense Auth. Provider   Fluocinolone Acetonide (DERMOTIC) 0.01 % OIL Place 5 drops in ear(s) 2 (two) times daily as needed (Itching in ears). 20 mL Lynden Oxford Scales, PA-C   cetirizine (ZYRTEC ALLERGY) 10 MG tablet Take 1 tablet (10 mg total) by mouth at bedtime. 90 tablet Lynden Oxford Scales, PA-C   fluticasone (FLONASE) 50 MCG/ACT nasal spray Place 1 spray into both nostrils daily. 47.4 mL Lynden Oxford Scales, PA-C      PDMP not reviewed this encounter.  Pending results:  Labs Reviewed - No data to display  Discharge Instructions:   Discharge Instructions      Your symptoms and my physical exam findings are concerning for exacerbation of your underlying allergies.     Zyrtec (cetirizine): This is an excellent second-generation antihistamine that helps to reduce respiratory inflammatory response to environmental allergens.  In some patients, this medication can cause daytime sleepiness so I recommend that you take 1 tablet daily at bedtime.     Flonase (fluticasone): This is a steroid  nasal spray that used once daily, 1 spray in each nare.  This works best when used on a daily basis. This medication does not work well if it is only used when you think you need it.  After 3 to 5 days of use, you will notice significant reduction of the inflammation and mucus production that is currently being caused by exposure to allergens, whether seasonal or environmental.  The most common side effect of this medication is nosebleeds.  If you experience a nosebleed, please discontinue use for 1 week, then feel free to resume.  If you find that your insurance will not pay for this medication, please consider a different nasal steroids such as Nasonex (mometasone), or Nasacort (triamcinolone).  Dermotic oil (fluocinolone): This is a topical steroid that you use in your ears to calm the inflammation caused by eczema.  Please instill 5 drops twice daily until you are feeling better, then you can to decrease as needed.   If symptoms have not meaningfully improved  in the next 5 to 7 days, please return for repeat evaluation or follow-up with your regular provider.  If symptoms have worsened in the next 3 to 5 days, please return for repeat evaluation or follow-up with your regular provider.   Thank you for visiting urgent care today.  We appreciate the opportunity to participate in your care.       Disposition Upon Discharge:  Condition: stable for discharge home  Patient presented with an acute illness with associated systemic symptoms and significant discomfort requiring urgent management. In my opinion, this is a condition that a prudent lay person (someone who possesses an average knowledge of health and medicine) may potentially expect to result in complications if not addressed urgently such as respiratory distress, impairment of bodily function or dysfunction of bodily organs.   Routine symptom specific, illness specific and/or disease specific instructions were discussed with the patient  and/or caregiver at length.   As such, the patient has been evaluated and assessed, work-up was performed and treatment was provided in alignment with urgent care protocols and evidence based medicine.  Patient/parent/caregiver has been advised that the patient may require follow up for further testing and treatment if the symptoms continue in spite of treatment, as clinically indicated and appropriate.  Patient/parent/caregiver has been advised to return to the [facility] or PCP if no better; to PCP or the Emergency Department if new signs and symptoms develop, or if the current signs or symptoms continue to change or worsen for further workup, evaluation and treatment as clinically indicated and appropriate  The patient will follow up with their current PCP if and as advised. If the patient does not currently have a PCP we will assist them in obtaining one.   The patient may need specialty follow up if the symptoms continue, in spite of conservative treatment and management, for further workup, evaluation, consultation and treatment as clinically indicated and appropriate.  Patient/parent/caregiver verbalized understanding and agreement of plan as discussed.  All questions were addressed during visit.  Please see discharge instructions below for further details of plan.  This office note has been dictated using Museum/gallery curator.  Unfortunately, this method of dictation can sometimes lead to typographical or grammatical errors.  I apologize for your inconvenience in advance if this occurs.  Please do not hesitate to reach out to me if clarification is needed.      Lynden Oxford Scales, PA-C 03/07/22 1350

## 2022-03-22 ENCOUNTER — Other Ambulatory Visit: Payer: Self-pay

## 2022-03-22 MED ORDER — NEBIVOLOL HCL 20 MG PO TABS: 20.0000 mg | ORAL_TABLET | Freq: Every day | ORAL | 3 refills | Status: DC

## 2022-03-22 MED ORDER — SPIRONOLACTONE 25 MG PO TABS: 25.0000 mg | ORAL_TABLET | Freq: Every day | ORAL | 3 refills | Status: DC

## 2022-03-25 ENCOUNTER — Ambulatory Visit
Admission: RE | Admit: 2022-03-25 | Discharge: 2022-03-25 | Disposition: A | Discharge status: Home / Self Care | Payer: Medicaid Other | Source: Ambulatory Visit | Attending: Nurse Practitioner | Admitting: Nurse Practitioner

## 2022-03-25 DIAGNOSIS — R519 Headache, unspecified: Secondary | ICD-10-CM

## 2022-03-25 DIAGNOSIS — H9201 Otalgia, right ear: Secondary | ICD-10-CM

## 2022-03-26 ENCOUNTER — Ambulatory Visit: Payer: Medicaid Other

## 2022-03-26 ENCOUNTER — Ambulatory Visit: Payer: Medicaid Other | Admitting: Podiatry

## 2022-03-26 DIAGNOSIS — M7751 Other enthesopathy of right foot: Secondary | ICD-10-CM | POA: Diagnosis not present

## 2022-03-26 DIAGNOSIS — M722 Plantar fascial fibromatosis: Secondary | ICD-10-CM

## 2022-03-26 MED ORDER — MELOXICAM 15 MG PO TABS: 15.0000 mg | ORAL_TABLET | Freq: Every day | ORAL | 0 refills | Status: DC | PRN

## 2022-03-26 NOTE — Progress Notes (Signed)
Subjective: Chief Complaint  Patient presents with   Plantar Fasciitis    Patient came in today for plantar fasciitis follow-up, back of the heel and calf pain, rate of pain 5 out of 10, TX: cam boot      57 year old female presents the office for above concerns.  She is a resisted knee pain  Left foot.  She also has been getting tightness in the calf area which started in September.  This is intermittent.  No recent injury or changes otherwise.   Previously she states that it felt like a nail on the bottom on the foot. She used a frozen water bottle which helped at first but not now. This started in October. Now it hurts to stand. It aches and feels like it is swollen but it is not. She has also tried heating pad, soaking in epsom salts, OTC NSAIDs, and chaing shoes.  She had a fall in August. This pain started in October.   Objective: AAO x3, NAD DP/PT pulses palpable bilaterally, CRT less than 3 seconds There is continuation of tenderness palpation on the plantar aspect of the heel and the insertion of plantar fascia on the left foot.  There is tenderness on the flexor tendons as well into the ankle.  Clinically the tendons appear to be intact.  There is no area pinpoint tenderness. Equinus is present but there is no pain with calf compression, erythema or warmth.  Assessment: Planter fasciitis, tendinitis ankle  Plan: -All treatment options discussed with the patient including all alternatives, risks, complications.  -Given the continuation of pain to the heel despite conservative treatment we discussed MRI.  This was ordered for her today. -In the meantime continue stretching, icing on a regular basis.  Continue shoes with arch support as well. -Prescribed mobic. Discussed side effects of the medication and directed to stop if any are to occur and call the office.   Return for MRI results .  Trula Slade DPM

## 2022-04-10 ENCOUNTER — Ambulatory Visit
Admission: RE | Admit: 2022-04-10 | Discharge: 2022-04-10 | Disposition: A | Discharge status: Home / Self Care | Payer: Medicaid Other | Source: Ambulatory Visit | Attending: Podiatry | Admitting: Podiatry

## 2022-04-10 DIAGNOSIS — M722 Plantar fascial fibromatosis: Secondary | ICD-10-CM

## 2022-04-14 ENCOUNTER — Other Ambulatory Visit: Payer: Self-pay | Admitting: Nurse Practitioner

## 2022-04-14 DIAGNOSIS — F419 Anxiety disorder, unspecified: Secondary | ICD-10-CM

## 2022-04-16 ENCOUNTER — Encounter: Payer: Medicaid Other | Admitting: Rheumatology

## 2022-04-17 ENCOUNTER — Encounter: Payer: Self-pay | Admitting: Nurse Practitioner

## 2022-04-17 ENCOUNTER — Other Ambulatory Visit: Payer: Self-pay | Admitting: Podiatry

## 2022-04-17 ENCOUNTER — Encounter: Payer: Self-pay | Admitting: Podiatry

## 2022-04-17 DIAGNOSIS — M7751 Other enthesopathy of right foot: Secondary | ICD-10-CM

## 2022-04-17 DIAGNOSIS — M722 Plantar fascial fibromatosis: Secondary | ICD-10-CM

## 2022-04-19 ENCOUNTER — Other Ambulatory Visit: Payer: Self-pay | Admitting: Podiatry

## 2022-04-19 DIAGNOSIS — R609 Edema, unspecified: Secondary | ICD-10-CM

## 2022-04-20 IMAGING — DX DG FOOT COMPLETE 3+V*R*
3 series · 3 of 3 positions shown · non-contrast
Comparison: None.

CLINICAL DATA: Pain after trauma.

EXAM:
RIGHT FOOT COMPLETE - 3+ VIEW

[foot ap]
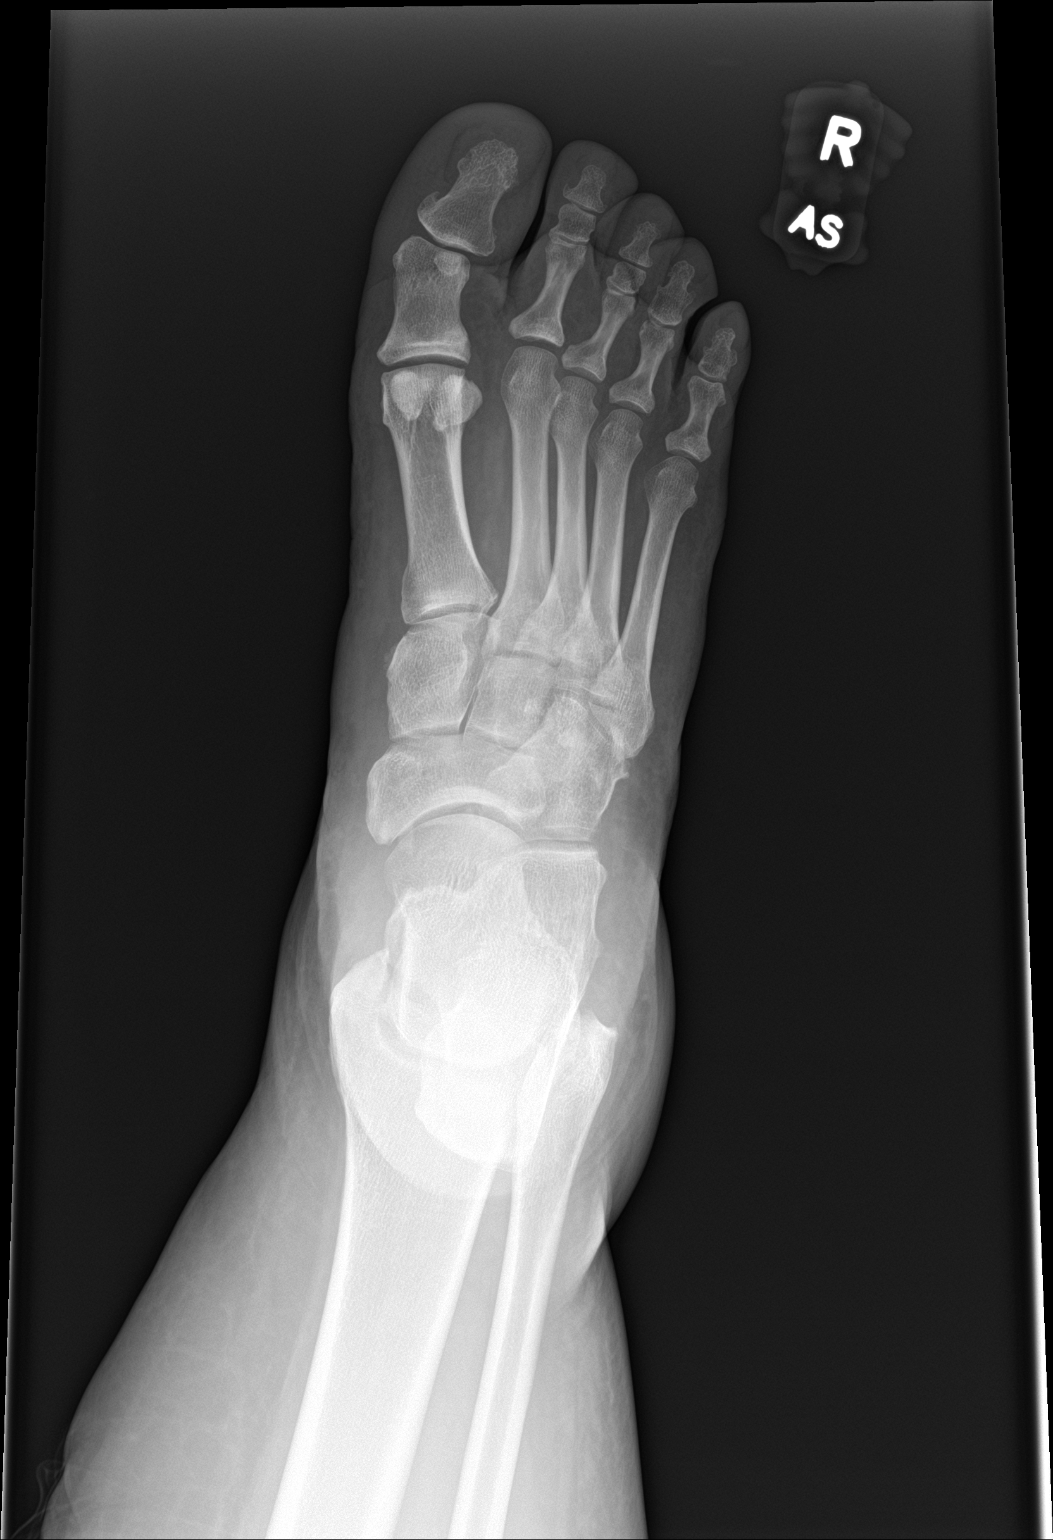

[foot obl]
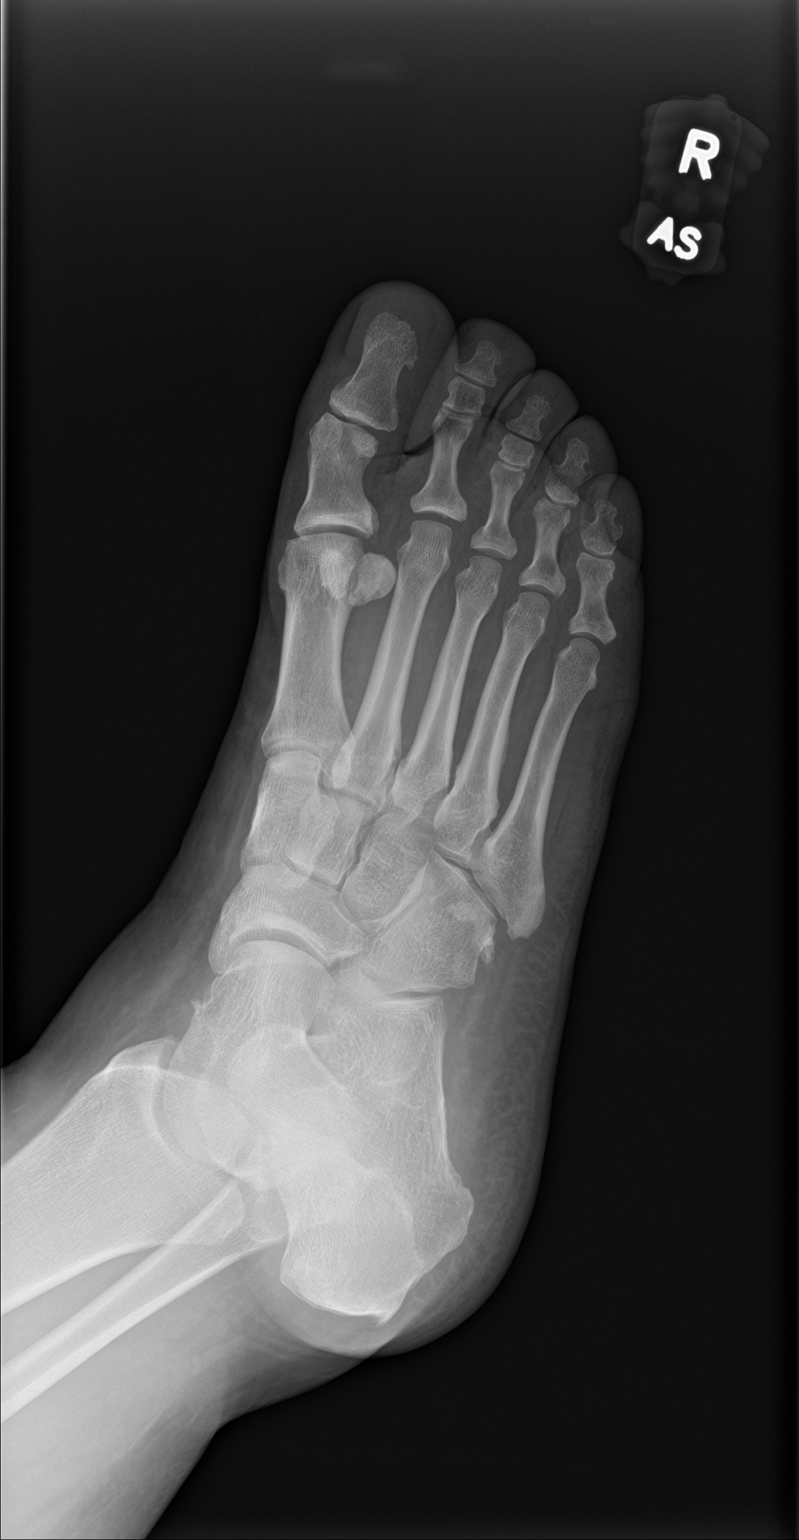

[foot lat]
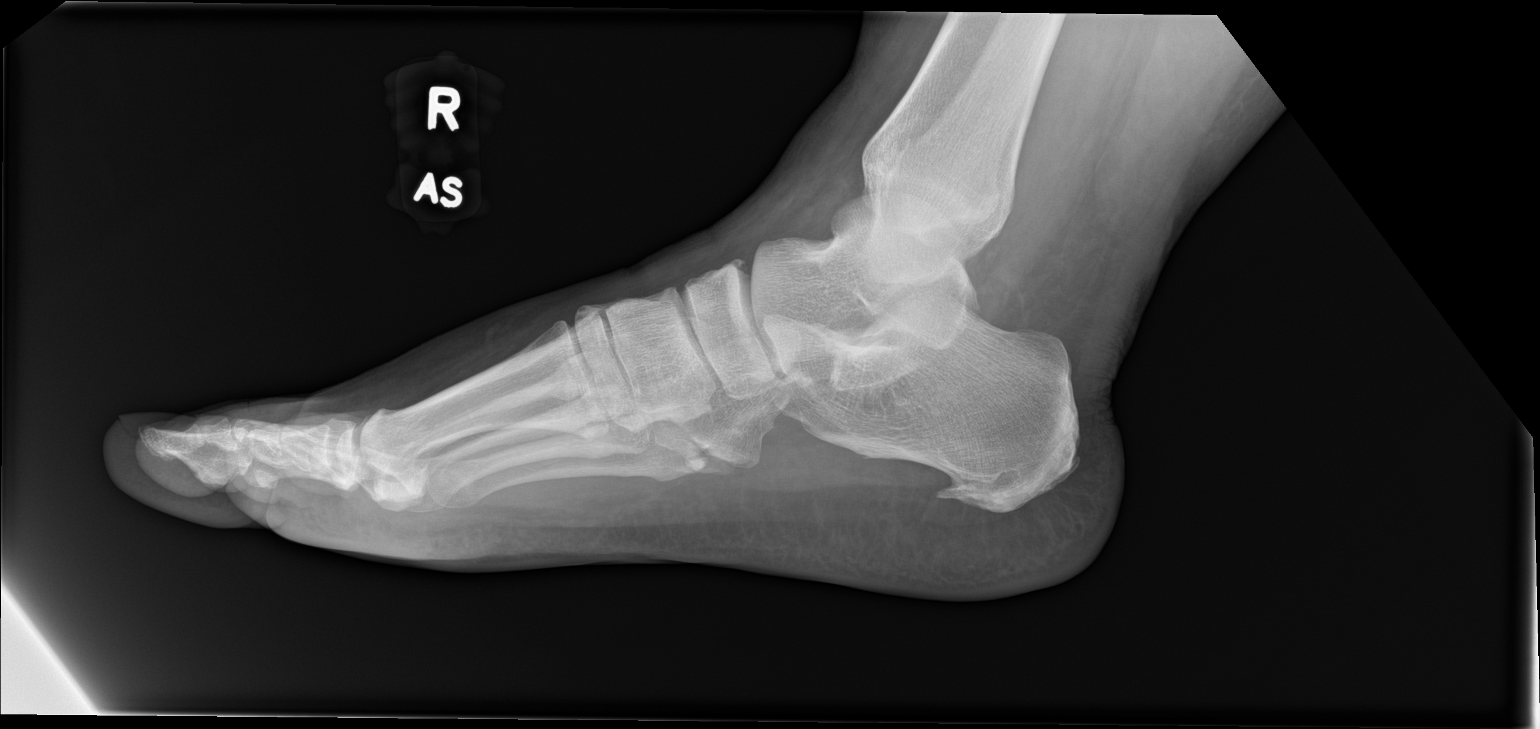

[3 of 3 positions shown; findings below may reference images not displayed]

FINDINGS: There is no evidence of fracture or dislocation. There is no
evidence of arthropathy or other focal bone abnormality. There is a
plantar calcaneal spur. Soft tissues are unremarkable.
IMPRESSION: Negative.

## 2022-05-08 ENCOUNTER — Ambulatory Visit: Payer: Medicaid Other | Admitting: Rheumatology

## 2022-05-13 ENCOUNTER — Encounter: Payer: Self-pay | Admitting: Nurse Practitioner

## 2022-05-13 ENCOUNTER — Ambulatory Visit (INDEPENDENT_AMBULATORY_CARE_PROVIDER_SITE_OTHER): Payer: Medicaid Other | Admitting: Nurse Practitioner

## 2022-05-13 VITALS — BP 132/88 | HR 70 | Temp 98.1°F | Ht 62.0 in | Wt 235.0 lb

## 2022-05-13 DIAGNOSIS — Z0000 Encounter for general adult medical examination without abnormal findings: Secondary | ICD-10-CM

## 2022-05-13 DIAGNOSIS — R21 Rash and other nonspecific skin eruption: Secondary | ICD-10-CM

## 2022-05-13 DIAGNOSIS — E7800 Pure hypercholesterolemia, unspecified: Secondary | ICD-10-CM | POA: Diagnosis not present

## 2022-05-13 DIAGNOSIS — Z1321 Encounter for screening for nutritional disorder: Secondary | ICD-10-CM

## 2022-05-13 DIAGNOSIS — I10 Essential (primary) hypertension: Secondary | ICD-10-CM | POA: Diagnosis not present

## 2022-05-13 DIAGNOSIS — R7309 Other abnormal glucose: Secondary | ICD-10-CM | POA: Diagnosis not present

## 2022-05-13 DIAGNOSIS — Z79899 Other long term (current) drug therapy: Secondary | ICD-10-CM

## 2022-05-13 MED ORDER — TRIAMCINOLONE ACETONIDE 0.1 % EX CREA: 1.0000 | TOPICAL_CREAM | CUTANEOUS | 5 refills | Status: DC | PRN

## 2022-05-13 NOTE — Progress Notes (Signed)
I,Sheena H Holbrook,acting as a Neurosurgeon for Arnette Felts, FNP.,have documented all relevant documentation on the behalf of Arnette Felts, FNP,as directed by  Arnette Felts, FNP while in the presence of Arnette Felts, FNP.   Subjective:     Patient ID: Sharon Bond , female    DOB: 1965/10/12 , 57 y.o.   MRN: [id number]   Chief Complaint  Patient presents with   Annual Exam    HPI  Patient presents today for annual exam.       Past Medical History:  Diagnosis Date   Allergy    pollen   Anxiety 2022   Eczema    GERD (gastroesophageal reflux disease)    Hypertension    IUD (intrauterine device) in place 2005   removed 2015   Mixed incontinence    Seasonal allergic rhinitis    Sleep apnea 10/2020   11/16/20--has not rec'd cpap yet   Wears glasses      Family History  Problem Relation Age of Onset   Diabetes Mother    Hypertension Mother    Alzheimer's disease Mother    Healthy Father    Diabetes Brother    Heart disease Maternal Aunt    Healthy Son    Healthy Son    Hypertension Daughter    Healthy Daughter    Cancer Neg Hx    Stroke Neg Hx    Sleep apnea Neg Hx    Colon cancer Neg Hx    Colon polyps Neg Hx    Esophageal cancer Neg Hx    Rectal cancer Neg Hx    Stomach cancer Neg Hx      Current Outpatient Medications:    albuterol (VENTOLIN HFA) 108 (90 Base) MCG/ACT inhaler, Inhale 2 puffs into the lungs every 6 (six) hours as needed for wheezing or shortness of breath., Disp: 8 g, Rfl: 2   busPIRone (BUSPAR) 10 MG tablet, TAKE 1 TABLET BY MOUTH EVERY DAY, Disp: 90 tablet, Rfl: 3   cetirizine (ZYRTEC ALLERGY) 10 MG tablet, Take 1 tablet (10 mg total) by mouth at bedtime., Disp: 90 tablet, Rfl: 1   diltiazem (CARDIZEM) 30 MG tablet, Take 1 tablet (30 mg total) by mouth in the morning and at bedtime., Disp: 180 tablet, Rfl: 1   EPINEPHrine 0.3 mg/0.3 mL IJ SOAJ injection, Inject 0.3 mg into the muscle as needed for anaphylaxis., Disp: 1 each, Rfl: 1    Fluocinolone Acetonide (DERMOTIC) 0.01 % OIL, Place 5 drops in ear(s) 2 (two) times daily as needed (Itching in ears)., Disp: 20 mL, Rfl: 0   fluticasone (FLONASE) 50 MCG/ACT nasal spray, Place 1 spray into both nostrils daily., Disp: 47.4 mL, Rfl: 1   Lactobacillus (PROBIOTIC ACIDOPHILUS PO), Take by mouth as needed., Disp: , Rfl:    meloxicam (MOBIC) 15 MG tablet, Take 1 tablet (15 mg total) by mouth daily as needed for pain., Disp: 30 tablet, Rfl: 0   Nebivolol HCl (BYSTOLIC) 20 MG TABS, Take 1 tablet (20 mg total) by mouth daily., Disp: 90 tablet, Rfl: 3   spironolactone (ALDACTONE) 25 MG tablet, Take 1 tablet (25 mg total) by mouth daily., Disp: 90 tablet, Rfl: 3   valsartan (DIOVAN) 320 MG tablet, Take 1 tablet (320 mg total) by mouth daily., Disp: 90 tablet, Rfl: 1   omeprazole (PRILOSEC OTC) 20 MG tablet, Take 1 tablet (20 mg total) by mouth daily. (Patient not taking: Reported on 05/13/2022), Disp: 30 tablet, Rfl: 1   triamcinolone cream (KENALOG) 0.1 %,  Apply 1 Application topically as needed. Pt using at least 4 times a day, Disp: 30 g, Rfl: 5   Allergies  Allergen Reactions   Olmesartan     "cough"   Amlodipine Cough   Penicillins Rash and Other (See Comments)    Hives, rash. No airway symptoms       The patient states she is status post hysterectomy.  Patient's last menstrual period was 11/28/2012.  Negative for Dysmenorrhea and Negative for Menorrhagia. Negative for: breast discharge, breast lump(s), breast pain and breast self exam. Associated symptoms include abnormal vaginal bleeding. Pertinent negatives include abnormal bleeding (hematology), anxiety, decreased libido, depression, difficulty falling sleep, dyspareunia, history of infertility, nocturia, sexual dysfunction, sleep disturbances, urinary incontinence, urinary urgency, vaginal discharge and vaginal itching. Diet regular; low sodium diet. She drinks approximately 48 oz The patient states her exercise level is minimal -  5 days a week - mostly stretching. Not much cardio with her exercising. She is taking care of her mother who has alzheimers. She is in the process of placing her in a facility.   The patient's tobacco use is:  Social History   Tobacco Use  Smoking Status Never   Passive exposure: Past  Smokeless Tobacco Never   She has been exposed to passive smoke. The patient's alcohol use is:  Social History   Substance and Sexual Activity  Alcohol Use Yes   Comment: occasionally during holidays, wine or beer    Review of Systems  All other systems reviewed and are negative.    Today's Vitals   05/13/22 0846  BP: 132/88  Pulse: 70  Temp: 98.1 F (36.7 C)  TempSrc: Oral  SpO2: 97%  Weight: 235 lb (106.6 kg)  Height: 5\' 2"  (1.575 m)   Body mass index is 42.98 kg/m.   Objective:  Physical Exam Vitals reviewed.  Constitutional:      General: She is not in acute distress.    Appearance: Normal appearance. She is well-developed. She is obese.  HENT:     Head: Normocephalic and atraumatic.     Right Ear: Hearing, tympanic membrane, ear canal and external ear normal. There is no impacted cerumen.     Left Ear: Hearing, tympanic membrane, ear canal and external ear normal. There is no impacted cerumen.     Nose: Nose normal.     Mouth/Throat:     Mouth: Mucous membranes are moist.  Eyes:     General: Lids are normal.     Extraocular Movements: Extraocular movements intact.     Conjunctiva/sclera: Conjunctivae normal.     Pupils: Pupils are equal, round, and reactive to light.     Funduscopic exam:    Right eye: No papilledema.        Left eye: No papilledema.  Neck:     Thyroid: No thyroid mass.     Vascular: No carotid bruit.  Cardiovascular:     Rate and Rhythm: Normal rate and regular rhythm.     Pulses: Normal pulses.     Heart sounds: Normal heart sounds. No murmur heard. Pulmonary:     Effort: Pulmonary effort is normal. No respiratory distress.     Breath sounds:  Normal breath sounds. No wheezing.  Chest:     Chest wall: No mass.  Breasts:    Tanner Score is 5.     Right: Normal. No mass or tenderness.     Left: Normal. No mass or tenderness.  Abdominal:     General: Abdomen  is flat. Bowel sounds are normal. There is no distension.     Palpations: Abdomen is soft.     Tenderness: There is no abdominal tenderness.  Musculoskeletal:        General: No swelling or tenderness. Normal range of motion.     Cervical back: Full passive range of motion without pain, normal range of motion and neck supple.     Right lower leg: No edema.     Left lower leg: No edema.  Lymphadenopathy:     Upper Body:     Right upper body: No supraclavicular, axillary or pectoral adenopathy.     Left upper body: No supraclavicular, axillary or pectoral adenopathy.  Skin:    General: Skin is warm and dry.     Capillary Refill: Capillary refill takes less than 2 seconds.  Neurological:     General: No focal deficit present.     Mental Status: She is alert and oriented to person, place, and time.     Cranial Nerves: No cranial nerve deficit.     Sensory: No sensory deficit.     Motor: No weakness.  Psychiatric:        Mood and Affect: Mood normal.        Behavior: Behavior normal.        Thought Content: Thought content normal.        Judgment: Judgment normal.         Assessment And Plan:     1. Encounter for general adult medical examination w/o abnormal findings Behavior modifications discussed and diet history reviewed.   Pt will continue to exercise regularly and modify diet with low GI, plant based foods and decrease intake of processed foods.  Recommend intake of daily multivitamin, Vitamin D, and calcium.  Recommend mammogram and colonoscopy for preventive screenings, as well as recommend immunizations that include influenza, TDAP, and Shingles  2. Elevated cholesterol Comments: Diet controlled, continue focusing on diet low in fat - Lipid panel  3.  Essential hypertension Comments: EKG done with Cardiology, Blood pressure is fairly controlled. Continue current medications. - CBC - CMP14+EGFR  4. Abnormal glucose Comments: Controlled, continue focusing on diet low in sugar and starches. - Hemoglobin A1c  5. Encounter for vitamin deficiency screening Will check vitamin D level and supplement as needed.    Also encouraged to spend 15 minutes in the sun daily.  - VITAMIN D 25 Hydroxy (Vit-D Deficiency, Fractures)  6. Obesity, Class III, BMI 40-49.9 (morbid obesity) (HCC)  7. Rash and nonspecific skin eruption Comments: History of eczema per patient - triamcinolone cream (KENALOG) 0.1 %; Apply 1 Application topically as needed. Pt using at least 4 times a day  Dispense: 30 g; Refill: 5  8. Other long term (current) drug therapy - CBC   Patient was given opportunity to ask questions. Patient verbalized understanding of the plan and was able to repeat key elements of the plan. All questions were answered to their satisfaction.   Arnette Felts, FNP   I, Arnette Felts, FNP, have reviewed all documentation for this visit. The documentation on 05/13/22 for the exam, diagnosis, procedures, and orders are all accurate and complete.   THE PATIENT IS ENCOURAGED TO PRACTICE SOCIAL DISTANCING DUE TO THE COVID-19 PANDEMIC.

## 2022-05-14 LAB — CBC
Hematocrit: 42.9 % (ref 34.0–46.6)
Hemoglobin: 14.1 g/dL (ref 11.1–15.9)
MCH: 29.4 pg (ref 26.6–33.0)
MCHC: 32.9 g/dL (ref 31.5–35.7)
MCV: 90 fL (ref 79–97)
Platelets: 190 10*3/uL (ref 150–450)
RBC: 4.79 x10E6/uL (ref 3.77–5.28)
RDW: 12.2 % (ref 11.7–15.4)
WBC: 5.5 10*3/uL (ref 3.4–10.8)

## 2022-05-14 LAB — CMP14+EGFR
ALT: 15 IU/L (ref 0–32)
AST: 16 IU/L (ref 0–40)
Albumin/Globulin Ratio: 1.4 (ref 1.2–2.2)
Albumin: 3.9 g/dL (ref 3.8–4.9)
Alkaline Phosphatase: 103 IU/L (ref 44–121)
BUN/Creatinine Ratio: 12 (ref 9–23)
BUN: 9 mg/dL (ref 6–24)
Bilirubin Total: 0.2 mg/dL (ref 0.0–1.2)
CO2: 22 mmol/L (ref 20–29)
Calcium: 9.1 mg/dL (ref 8.7–10.2)
Chloride: 107 mmol/L — ABNORMAL HIGH (ref 96–106)
Creatinine, Ser: 0.77 mg/dL (ref 0.57–1.00)
Globulin, Total: 2.7 g/dL (ref 1.5–4.5)
Glucose: 90 mg/dL (ref 70–99)
Potassium: 4.4 mmol/L (ref 3.5–5.2)
Sodium: 143 mmol/L (ref 134–144)
Total Protein: 6.6 g/dL (ref 6.0–8.5)
eGFR: 91 mL/min/{1.73_m2} (ref >59)

## 2022-05-14 LAB — LIPID PANEL
Chol/HDL Ratio: 3.4 ratio (ref 0.0–4.4)
Cholesterol, Total: 192 mg/dL (ref 100–199)
HDL: 56 mg/dL (ref >39)
LDL Chol Calc (NIH): 120 mg/dL — ABNORMAL HIGH (ref 0–99)
Triglycerides: 91 mg/dL (ref 0–149)
VLDL Cholesterol Cal: 16 mg/dL (ref 5–40)

## 2022-05-14 LAB — HEMOGLOBIN A1C
Est. average glucose Bld gHb Est-mCnc: 126 mg/dL
Hgb A1c MFr Bld: 6 % — ABNORMAL HIGH (ref 4.8–5.6)

## 2022-05-14 LAB — VITAMIN D 25 HYDROXY (VIT D DEFICIENCY, FRACTURES): Vit D, 25-Hydroxy: 16.2 ng/mL — ABNORMAL LOW (ref 30.0–100.0)

## 2022-05-20 MED ORDER — VITAMIN D (ERGOCALCIFEROL) 1.25 MG (50000 UNIT) PO CAPS: 50000.0000 [IU] | ORAL_CAPSULE | ORAL | 1 refills | Status: DC

## 2022-05-23 ENCOUNTER — Other Ambulatory Visit: Payer: Self-pay

## 2022-05-23 ENCOUNTER — Other Ambulatory Visit: Payer: Self-pay | Admitting: Podiatry

## 2022-05-23 MED ORDER — DILTIAZEM HCL 30 MG PO TABS: 30.0000 mg | ORAL_TABLET | Freq: Two times a day (BID) | ORAL | 2 refills | Status: DC

## 2022-06-04 ENCOUNTER — Ambulatory Visit (INDEPENDENT_AMBULATORY_CARE_PROVIDER_SITE_OTHER): Payer: Medicaid Other

## 2022-06-04 VITALS — BP 132/88 | HR 70 | Temp 97.8°F | Ht 62.0 in | Wt 235.0 lb

## 2022-06-04 DIAGNOSIS — Z23 Encounter for immunization: Secondary | ICD-10-CM

## 2022-06-04 NOTE — Progress Notes (Signed)
Patient presents today for a covid vaccine. Patient waited 15 mins after receiving the covid vaccine. YL,RMA

## 2022-06-18 ENCOUNTER — Encounter: Payer: Self-pay | Admitting: Physical Therapy

## 2022-06-18 ENCOUNTER — Other Ambulatory Visit: Payer: Self-pay

## 2022-06-18 ENCOUNTER — Ambulatory Visit: Payer: Medicaid Other | Attending: Podiatry | Admitting: Physical Therapy

## 2022-06-18 DIAGNOSIS — M722 Plantar fascial fibromatosis: Secondary | ICD-10-CM | POA: Diagnosis not present

## 2022-06-18 DIAGNOSIS — M7751 Other enthesopathy of right foot: Secondary | ICD-10-CM | POA: Insufficient documentation

## 2022-06-18 DIAGNOSIS — M79672 Pain in left foot: Secondary | ICD-10-CM | POA: Diagnosis present

## 2022-06-18 DIAGNOSIS — M6281 Muscle weakness (generalized): Secondary | ICD-10-CM | POA: Diagnosis present

## 2022-06-18 NOTE — Patient Instructions (Signed)
Access Code: [email or web address] URL: [email or web address] Date: 06/18/2022 Prepared by: Rosana Hoes  Exercises - Long Sitting Calf Stretch with Strap  - 2 x daily - 3 reps - 30 seconds hold - Long Sitting Ankle Plantar Flexion with Resistance  - 1 x daily - 3 sets - 10 reps - Seated Heel Toe Raises  - 1 x daily - 3 sets - 10 reps

## 2022-06-18 NOTE — Therapy (Signed)
OUTPATIENT PHYSICAL THERAPY EVALUATION   Patient Name: Sharon Bond MRN: [id number] DOB:[birth year], 57 y.o., female Today's Date: 06/18/2022   END OF SESSION:  PT End of Session - 06/18/22 0902     Visit Number 1    Number of Visits 9    Date for PT Re-Evaluation 08/13/22    Authorization Type [employer]    PT Start Time [phone]    PT Stop Time 0930    PT Time Calculation (min) 37 min    Activity Tolerance Patient tolerated treatment well    Behavior During Therapy [facility] for tasks assessed/performed             Past Medical History:  Diagnosis Date   Allergy    pollen   Anxiety 2022   Eczema    GERD (gastroesophageal reflux disease)    Hypertension    IUD (intrauterine device) in place 2005   removed 2015   Mixed incontinence    Seasonal allergic rhinitis    Sleep apnea 10/2020   11/16/20--has not rec'd cpap yet   Wears glasses    Past Surgical History:  Procedure Laterality Date   DILATATION & CURETTAGE/HYSTEROSCOPY WITH TRUECLEAR N/A 01/08/2013   Procedure: DILATATION & CURETTAGE/HYSTEROSCOPY WITH TRUCLEAR, polypectomy;  Surgeon: Alphonsus Sias. Ernestina Penna, MD;  Location: [facility];  Service: Gynecology;  Laterality: N/A;   LAPAROSCOPIC TUBAL LIGATION Bilateral 01/08/2013   Procedure: DIAGNOSTIC LAPAROSCOPY Bilateral Salpingectomy;  Surgeon: Tresa Endo A. Ernestina Penna, MD;  Location: [facility];  Service: Gynecology;  Laterality: Bilateral;   PARTIAL HYSTERECTOMY  2019   WISDOM TOOTH EXTRACTION     Patient Active Problem List   Diagnosis Date Noted   Elevated cholesterol 05/13/2022   Temporal pain 02/25/2022   Pain in joint of left knee 10/12/2021   Obstructive sleep apnea hypopnea, moderate 10/03/2020   Insomnia secondary to chronic pain 06/28/2020   Psychophysiological insomnia 06/28/2020   Class 3 severe obesity due to excess calories without serious comorbidity with body mass index (BMI) of 40.0 to 44.9 in adult (HCC) 06/28/2020   Snoring 06/28/2020   Pain in right arm  05/31/2019   Dysmenorrhea 08/12/2017   Dyspareunia in female 08/12/2017   Fibroid uterus 08/12/2017   Menorrhagia with regular cycle 08/12/2017   Intrinsic eczema 02/27/2017   Obesity, Class III, BMI 40-49.9 (morbid obesity) (HCC) 02/13/2017   Elevated blood pressure 10/07/2012   Hypokalemia 03/20/2006   ANXIETY 03/20/2006   Essential hypertension 03/20/2006   Rhinitis, allergic 03/20/2006   GASTROESOPHAGEAL REFLUX, NO ESOPHAGITIS 03/20/2006   FIBROMYALGIA, FIBROMYOSITIS 03/20/2006   MUSCLE CRAMPS NOS 03/20/2006    PCP: Arnette Felts, FNP  REFERRING PROVIDER: Vivi Barrack, DPM  REFERRING DIAG: Tendonitis of ankle, right; Plantar fasciitis  THERAPY DIAG:  Pain in left foot  Muscle weakness (generalized)  Rationale for Evaluation and Treatment: Rehabilitation  ONSET DATE: September 2023   SUBJECTIVE:  SUBJECTIVE STATEMENT: Patient reports about a year ago she fell and it messed up her knee and she developed left foot pain. 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 She states she wasn't walking flat on her feet for a while and she was placed in a boot which helped a little. She was in the boot for about 6 weeks and when she came out the pain came back. Pain is located to the bottom of the foot and heel. She has tried different shoes that haven't helped much. Pain can occur at night when she lies down,  and bothers her when she is on her feet. She also notices swelling in both her ankles when she is standing a lot, she does elevate her legs which helps with the swelling.  PERTINENT HISTORY: See PMH above  PAIN:  Are you having pain? Yes:  NPRS scale: 6/10 Pain location: Left foot/heel Pain description: Stabbing, pulsating  Aggravating factors: Standing extended periods,  Relieving factors: Rolling foot on ball or frozen water bottle  PRECAUTIONS: None  WEIGHT BEARING RESTRICTIONS: No  FALLS:  Has patient fallen in last 6 months? No  OCCUPATION:  Retail, standing  PLOF: Independent  PATIENT GOALS: Improve pain with standing at work   OBJECTIVE:  DIAGNOSTIC FINDINGS:  Left ankle MRI 04/10/2022: IMPRESSION: 1. Ligaments and tendons of the ankle are intact without evidence of tear. 2. Marrow signal is within normal limits. No evidence of fracture or osteonecrosis. 3. Subcutaneous soft tissue edema about the medial and lateral aspect of the ankle without evidence of fluid collection or hematoma.  PATIENT SURVEYS:  FOTO 54 % functional status  COGNITION: Overall cognitive status: Within functional limits for tasks assessed     SENSATION: WFL  EDEMA:  Bilateral ankle edema noted  MUSCLE LENGTH: Calf flexibility deficit on left  PALPATION: Tender to palpation plantar fascia and calcaneal tubercle  LOWER EXTREMITY ROM:  Active ROM Right eval Left eval  Hip flexion    Hip extension    Hip abduction    Hip adduction    Hip internal rotation    Hip external rotation    Knee flexion    Knee extension    Ankle dorsiflexion 8 Lacking 5  Ankle plantarflexion 55 53  Ankle inversion 25 25  Ankle eversion 10 10   (Blank rows = not tested)  LOWER EXTREMITY MMT:  MMT Right eval Left eval  Hip flexion    Hip extension    Hip abduction    Hip adduction    Hip internal rotation    Hip external rotation    Knee flexion    Knee extension    Ankle dorsiflexion 5 4  Ankle plantarflexion 4 2  Ankle inversion 5 4  Ankle eversion 5 4   (Blank rows = not tested)  FUNCTIONAL TESTS:  SLS: < 5 sec bilaterally  GAIT: Assistive device utilized: None Level of assistance: Complete Independence Comments: Bilateral toe out (L > R)   TODAY'S TREATMENT:    [facility] Adult PT Treatment:                                                DATE: 06/18/2022 Therapeutic Exercise: Longsitting calf stretch with towel 3 x 30 sec Longsitting ankle DF with red 2 x 10 Seated heel toe raises 2 x 10  PATIENT EDUCATION:  Education  details: Exam findings, POC, HEP Person educated: Patient Education method: Explanation, Demonstration, Tactile cues, Verbal cues, and Handouts Education comprehension: verbalized understanding, returned demonstration, verbal cues required, tactile cues required, and needs further education  HOME EXERCISE PROGRAM: Access Code: [email or web address]    ASSESSMENT: CLINICAL IMPRESSION: Patient is a 57 y.o. female who was seen today for physical therapy evaluation and treatment for left foot and heel pain. Her symptoms seem consistent with plantar fasciitis and she demonstrates flexibility deficits of the left calf and gross strength deficit of the left ankle.   OBJECTIVE IMPAIRMENTS: Abnormal gait,  decreased activity tolerance, decreased balance, decreased ROM, decreased strength, impaired flexibility, and pain.   ACTIVITY LIMITATIONS: standing, stairs, and locomotion level  PARTICIPATION LIMITATIONS: meal prep, shopping, community activity, and occupation  PERSONAL FACTORS: Fitness, Past/current experiences, and Time since onset of injury/illness/exacerbation are also affecting patient's functional outcome.   REHAB POTENTIAL: Good  CLINICAL DECISION MAKING: Stable/uncomplicated  EVALUATION COMPLEXITY: Low   GOALS: Goals reviewed with patient? Yes  SHORT TERM GOALS: Target date: 07/16/2022  Patient will be I with initial HEP in order to progress with therapy. Baseline: HEP provided at eval Goal status: INITIAL  2.  Patient will demonstrate left ankle DF >/= 0 deg in order to improve gait and reduce heel pain Baseline: lacking 5 deg Goal status: INITIAL  3.  Patient will report left foot and heel pain </= 3/10 in order to reduce functional limitations Baseline: 6/10 Goal status: INITIAL  LONG TERM GOALS: Target date: 08/13/2022  Patient will be I with final HEP to maintain progress from PT. Baseline: HEP provided at eval Goal status: INITIAL  2.  Patient will report >/= 68% status on  FOTO to indicate improved functional ability. Baseline: 54% functional status Goal status: INITIAL  3.  Patient will demonstrate left ankle strength 5/5 MMT in order to improve standing and walking tolerance Baseline: limitations noted above Goal status: INITIAL  4.  Patient will exhibit left ankle DF >/= 5 deg in order to improve gait and reduce heel pain Baseline: lacking 5 deg Goal status: INITIAL   PLAN: PT FREQUENCY: 1x/week  PT DURATION: 8 weeks  PLANNED INTERVENTIONS: Therapeutic exercises, Therapeutic activity, Neuromuscular re-education, Balance training, Gait training, Patient/Family education, Self Care, Joint mobilization, Aquatic Therapy, Dry Needling, Ultrasound, Manual therapy, and Re-evaluation (Iontophoresis not covered under [employer])  PLAN FOR NEXT SESSION: Review HEP and progress PRN, calf stretching, ankle strengthening, progress to closed chain as tolerated, balance training   Rosana Hoes, PT, DPT, LAT, ATC 06/18/22  10:21 AM Phone: [phone] Fax: [phone]   Check all possible CPT codes: 29562 - PT Re-evaluation, 97110- Therapeutic Exercise, [phone]- Neuro Re-education, [phone] - Gait Training, [phone] - Manual Therapy, 97530 - Therapeutic Activities, 97535 - Self Care, [phone] - Ultrasound, and [id number] - Aquatic therapy    Check all conditions that are expected to impact treatment: {Conditions expected to impact treatment:Morbid obesity   If treatment provided at initial evaluation, no treatment charged due to lack of authorization.

## 2022-06-20 NOTE — Progress Notes (Deleted)
Office Visit Note  Patient: Sharon Bond             Date of Birth: [birth year]           MRN: [id number]             PCP: Arnette Felts, FNP Referring: Arnette Felts, FNP Visit Date: 07/03/2022 Occupation: @GUAROCC @  Subjective:  No chief complaint on file.   History of Present Illness: Sharon Bond is a 57 y.o. female ***     Activities of Daily Living:  Patient reports morning stiffness for *** {minute/hour:19697}.   Patient {ACTIONS;DENIES/REPORTS:21021675::"Denies"} nocturnal pain.  Difficulty dressing/grooming: {ACTIONS;DENIES/REPORTS:21021675::"Denies"} Difficulty climbing stairs: {ACTIONS;DENIES/REPORTS:21021675::"Denies"} Difficulty getting out of chair: {ACTIONS;DENIES/REPORTS:21021675::"Denies"} Difficulty using hands for taps, buttons, cutlery, and/or writing: {ACTIONS;DENIES/REPORTS:21021675::"Denies"}  No Rheumatology ROS completed.   PMFS History:  Patient Active Problem List   Diagnosis Date Noted   Elevated cholesterol 05/13/2022   Temporal pain 02/25/2022   Pain in joint of left knee 10/12/2021   Obstructive sleep apnea hypopnea, moderate 10/03/2020   Insomnia secondary to chronic pain 06/28/2020   Psychophysiological insomnia 06/28/2020   Class 3 severe obesity due to excess calories without serious comorbidity with body mass index (BMI) of 40.0 to 44.9 in adult (HCC) 06/28/2020   Snoring 06/28/2020   Pain in right arm 05/31/2019   Dysmenorrhea 08/12/2017   Dyspareunia in female 08/12/2017   Fibroid uterus 08/12/2017   Menorrhagia with regular cycle 08/12/2017   Intrinsic eczema 02/27/2017   Obesity, Class III, BMI 40-49.9 (morbid obesity) (HCC) 02/13/2017   Elevated blood pressure 10/07/2012   Hypokalemia 03/20/2006   ANXIETY 03/20/2006   Essential hypertension 03/20/2006   Rhinitis, allergic 03/20/2006   GASTROESOPHAGEAL REFLUX, NO ESOPHAGITIS 03/20/2006   FIBROMYALGIA, FIBROMYOSITIS 03/20/2006   MUSCLE CRAMPS NOS 03/20/2006    Past  Medical History:  Diagnosis Date   Allergy    pollen   Anxiety 2022   Eczema    GERD (gastroesophageal reflux disease)    Hypertension    IUD (intrauterine device) in place 2005   removed 2015   Mixed incontinence    Seasonal allergic rhinitis    Sleep apnea 10/2020   11/16/20--has not rec'd cpap yet   Wears glasses     Family History  Problem Relation Age of Onset   Diabetes Mother    Hypertension Mother    Alzheimer's disease Mother    Healthy Father    Diabetes Brother    Heart disease Maternal Aunt    Healthy Son    Healthy Son    Hypertension Daughter    Healthy Daughter    Cancer Neg Hx    Stroke Neg Hx    Sleep apnea Neg Hx    Colon cancer Neg Hx    Colon polyps Neg Hx    Esophageal cancer Neg Hx    Rectal cancer Neg Hx    Stomach cancer Neg Hx    Past Surgical History:  Procedure Laterality Date   DILATATION & CURETTAGE/HYSTEROSCOPY WITH TRUECLEAR N/A 01/08/2013   Procedure: DILATATION & CURETTAGE/HYSTEROSCOPY WITH TRUCLEAR, polypectomy;  Surgeon: Tresa Endo A. Ernestina Penna, MD;  Location: [facility];  Service: Gynecology;  Laterality: N/A;   LAPAROSCOPIC TUBAL LIGATION Bilateral 01/08/2013   Procedure: DIAGNOSTIC LAPAROSCOPY Bilateral Salpingectomy;  Surgeon: Tresa Endo A. Ernestina Penna, MD;  Location: [facility];  Service: Gynecology;  Laterality: Bilateral;   PARTIAL HYSTERECTOMY  2019   WISDOM TOOTH EXTRACTION     Social History   Social History Narrative   married, 16  yo son, 58 yo, 55 yo, exercise - some with walking   Lives at home with spouse and 10 y.o. son   Left handed   Caffeine: decaf coffee on the weekend    Immunization History  Administered Date(s) Administered   COVID-19, mRNA, vaccine(Comirnaty)12 years and older 06/04/2022   Influenza,inj,Quad PF,6+ Mos 11/03/2012, 01/10/2022   PFIZER(Purple Top)SARS-COV-2 Vaccination 09/01/2019, 09/22/2019, 05/13/2020   Pneumococcal Polysaccharide-23 12/19/2020   Tdap 11/03/2012   Zoster Recombinat (Shingrix) 10/16/2020    Zoster, Live 05/24/2021     Objective: Vital Signs: LMP 11/28/2012    Physical Exam   Musculoskeletal Exam: ***  CDAI Exam: CDAI Score: -- Patient Global: --; Provider Global: -- Swollen: --; Tender: -- Joint Exam 07/03/2022   No joint exam has been documented for this visit   There is currently no information documented on the homunculus. Go to the Rheumatology activity and complete the homunculus joint exam.  Investigation: No additional findings.  Imaging: No results found.  Recent Labs: Lab Results  Component Value Date   WBC 5.5 05/13/2022   HGB 14.1 05/13/2022   PLT 190 05/13/2022   NA 143 05/13/2022   K 4.4 05/13/2022   CL 107 (H) 05/13/2022   CO2 22 05/13/2022   GLUCOSE 90 05/13/2022   BUN 9 05/13/2022   CREATININE 0.77 05/13/2022   BILITOT 0.2 05/13/2022   ALKPHOS 103 05/13/2022   AST 16 05/13/2022   ALT 15 05/13/2022   PROT 6.6 05/13/2022   ALBUMIN 3.9 05/13/2022   CALCIUM 9.1 05/13/2022   GFRAA 102 03/09/2020    Speciality Comments: No specialty comments available.  Procedures:  No procedures performed Allergies: Olmesartan, Amlodipine, and Penicillins   Assessment / Plan:     Visit Diagnoses: No diagnosis found.  Orders: No orders of the defined types were placed in this encounter.  No orders of the defined types were placed in this encounter.   Face-to-face time spent with patient was *** minutes. Greater than 50% of time was spent in counseling and coordination of care.  Follow-Up Instructions: No follow-ups on file.   Ellen Henri, CMA  Note - This record has been created using Animal nutritionist.  Chart creation errors have been sought, but may not always  have been located. Such creation errors do not reflect on  the standard of medical care.

## 2022-06-24 ENCOUNTER — Other Ambulatory Visit: Payer: Self-pay

## 2022-06-24 DIAGNOSIS — R072 Precordial pain: Secondary | ICD-10-CM

## 2022-06-24 MED ORDER — VALSARTAN 320 MG PO TABS
320.0000 mg | ORAL_TABLET | Freq: Every day | ORAL | 1 refills | Status: DC
Start: 2022-06-24 — End: 2022-12-27

## 2022-07-03 ENCOUNTER — Ambulatory Visit: Payer: Medicaid Other | Attending: Podiatry | Admitting: Physical Therapy

## 2022-07-03 ENCOUNTER — Encounter: Payer: Self-pay | Admitting: Physical Therapy

## 2022-07-03 ENCOUNTER — Other Ambulatory Visit: Payer: Self-pay

## 2022-07-03 ENCOUNTER — Ambulatory Visit: Payer: Medicaid Other | Admitting: Rheumatology

## 2022-07-03 DIAGNOSIS — R768 Other specified abnormal immunological findings in serum: Secondary | ICD-10-CM

## 2022-07-03 DIAGNOSIS — M79672 Pain in left foot: Secondary | ICD-10-CM | POA: Diagnosis present

## 2022-07-03 DIAGNOSIS — M6281 Muscle weakness (generalized): Secondary | ICD-10-CM | POA: Insufficient documentation

## 2022-07-03 DIAGNOSIS — G4733 Obstructive sleep apnea (adult) (pediatric): Secondary | ICD-10-CM

## 2022-07-03 DIAGNOSIS — Z13820 Encounter for screening for osteoporosis: Secondary | ICD-10-CM

## 2022-07-03 DIAGNOSIS — Z8659 Personal history of other mental and behavioral disorders: Secondary | ICD-10-CM

## 2022-07-03 DIAGNOSIS — G8929 Other chronic pain: Secondary | ICD-10-CM

## 2022-07-03 DIAGNOSIS — M5136 Other intervertebral disc degeneration, lumbar region: Secondary | ICD-10-CM

## 2022-07-03 DIAGNOSIS — Z8719 Personal history of other diseases of the digestive system: Secondary | ICD-10-CM

## 2022-07-03 DIAGNOSIS — Z780 Asymptomatic menopausal state: Secondary | ICD-10-CM

## 2022-07-03 DIAGNOSIS — M79641 Pain in right hand: Secondary | ICD-10-CM

## 2022-07-03 DIAGNOSIS — M797 Fibromyalgia: Secondary | ICD-10-CM

## 2022-07-03 DIAGNOSIS — M7711 Lateral epicondylitis, right elbow: Secondary | ICD-10-CM

## 2022-07-03 DIAGNOSIS — I10 Essential (primary) hypertension: Secondary | ICD-10-CM

## 2022-07-03 DIAGNOSIS — M25552 Pain in left hip: Secondary | ICD-10-CM

## 2022-07-03 DIAGNOSIS — L2084 Intrinsic (allergic) eczema: Secondary | ICD-10-CM

## 2022-07-03 NOTE — Therapy (Signed)
OUTPATIENT PHYSICAL THERAPY TREATMENT NOTE   Patient Name: Sharon Bond MRN: [id number] DOB:16-Nov-1965, 57 y.o., female Today's Date: 07/03/2022  PCP: Arnette Felts, FNP REFERRING PROVIDER: Vivi Barrack, DPM   END OF SESSION:   PT End of Session - 07/03/22 1552     Visit Number 2    Number of Visits 9    Date for PT Re-Evaluation 08/13/22    Authorization Type [employer]    Authorization Time Period 06/24/2022 - 08/22/2022    Authorization - Visit Number 1    Authorization - Number of Visits 7    PT Start Time 1530    PT Stop Time 1610    PT Time Calculation (min) 40 min    Activity Tolerance Patient tolerated treatment well    Behavior During Therapy [facility] for tasks assessed/performed             Past Medical History:  Diagnosis Date   Allergy    pollen   Anxiety 2022   Eczema    GERD (gastroesophageal reflux disease)    Hypertension    IUD (intrauterine device) in place 2005   removed 2015   Mixed incontinence    Seasonal allergic rhinitis    Sleep apnea 10/2020   11/16/20--has not rec'd cpap yet   Wears glasses    Past Surgical History:  Procedure Laterality Date   DILATATION & CURETTAGE/HYSTEROSCOPY WITH TRUECLEAR N/A 01/08/2013   Procedure: DILATATION & CURETTAGE/HYSTEROSCOPY WITH TRUCLEAR, polypectomy;  Surgeon: Alphonsus Sias. Ernestina Penna, MD;  Location: [facility];  Service: Gynecology;  Laterality: N/A;   LAPAROSCOPIC TUBAL LIGATION Bilateral 01/08/2013   Procedure: DIAGNOSTIC LAPAROSCOPY Bilateral Salpingectomy;  Surgeon: Tresa Endo A. Ernestina Penna, MD;  Location: [facility];  Service: Gynecology;  Laterality: Bilateral;   PARTIAL HYSTERECTOMY  2019   WISDOM TOOTH EXTRACTION     Patient Active Problem List   Diagnosis Date Noted   Elevated cholesterol 05/13/2022   Temporal pain 02/25/2022   Pain in joint of left knee 10/12/2021   Obstructive sleep apnea hypopnea, moderate 10/03/2020   Insomnia secondary to chronic pain 06/28/2020   Psychophysiological insomnia  06/28/2020   Class 3 severe obesity due to excess calories without serious comorbidity with body mass index (BMI) of 40.0 to 44.9 in adult (HCC) 06/28/2020   Snoring 06/28/2020   Pain in right arm 05/31/2019   Dysmenorrhea 08/12/2017   Dyspareunia in female 08/12/2017   Fibroid uterus 08/12/2017   Menorrhagia with regular cycle 08/12/2017   Intrinsic eczema 02/27/2017   Obesity, Class III, BMI 40-49.9 (morbid obesity) (HCC) 02/13/2017   Elevated blood pressure 10/07/2012   Hypokalemia 03/20/2006   ANXIETY 03/20/2006   Essential hypertension 03/20/2006   Rhinitis, allergic 03/20/2006   GASTROESOPHAGEAL REFLUX, NO ESOPHAGITIS 03/20/2006   FIBROMYALGIA, FIBROMYOSITIS 03/20/2006   MUSCLE CRAMPS NOS 03/20/2006    REFERRING DIAG: Tendonitis of ankle, right; Plantar fasciitis   THERAPY DIAG:  Pain in left foot  Muscle weakness (generalized)  Rationale for Evaluation and Treatment Rehabilitation  PERTINENT HISTORY: See PMH above   PRECAUTIONS: None    SUBJECTIVE:  SUBJECTIVE STATEMENT:  Patient feels her foot is still feeling tense. She just got off work.  PAIN:  Are you having pain? Yes:  NPRS scale: 5/10 Pain location: Left foot/heel Pain description: Stabbing, pulsating  Aggravating factors: Standing extended periods,  Relieving factors: Rolling foot on ball or frozen water bottle   OBJECTIVE: (objective measures completed at initial evaluation unless otherwise dated) PATIENT SURVEYS:  FOTO 54 % functional status   EDEMA:  Bilateral ankle edema noted   MUSCLE LENGTH: Calf flexibility deficit on left   PALPATION: Tender to palpation plantar fascia and calcaneal tubercle   LOWER EXTREMITY ROM:   Active ROM Right eval Left eval Left 07/03/22  Hip flexion       Hip extension       Hip  abduction       Hip adduction       Hip internal rotation       Hip external rotation       Knee flexion       Knee extension       Ankle dorsiflexion 8 Lacking 5 0  Ankle plantarflexion 55 53   Ankle inversion 25 25   Ankle eversion 10 10    (Blank rows = not tested)   LOWER EXTREMITY MMT:   MMT Right eval Left eval  Hip flexion      Hip extension      Hip abduction      Hip adduction      Hip internal rotation      Hip external rotation      Knee flexion      Knee extension      Ankle dorsiflexion 5 4  Ankle plantarflexion 4 2  Ankle inversion 5 4  Ankle eversion 5 4   (Blank rows = not tested)   FUNCTIONAL TESTS:  SLS: < 5 sec bilaterally   GAIT: Assistive device utilized: None Level of assistance: Complete Independence Comments: Bilateral toe out (L > R)     TODAY'S TREATMENT:    [facility] Adult PT Treatment:                                                DATE: 07/03/2022 Therapeutic Exercise: Instruction on SMFR using tennis ball for left calf, patient reported tennis ball was too tender so used large red med ball Standing calf stretch at counter 2 x 30 sec Seated toe raise x 10 Longsitting ankle DF with green 2 x 15 Seated towel scrunches 2 x 20 Manual: IASTM using tool for left calf Passive calf stretch in supine and prone with knee bent ART for left calf   [facility] Adult PT Treatment:                                                DATE: 06/18/2022 Therapeutic Exercise: Longsitting calf stretch with towel 3 x 30 sec Longsitting ankle DF with red 2 x 10 Seated heel toe raises 2 x 10   PATIENT EDUCATION:  Education details: HEP update Person educated: Patient Education method: Explanation, Demonstration, Tactile cues, Verbal cues, and Handouts Education comprehension: verbalized understanding, returned demonstration, verbal cues required, tactile cues required, and needs further education   HOME EXERCISE PROGRAM:  Access Code: [email or web address]       ASSESSMENT: CLINICAL IMPRESSION: Patient tolerated therapy well with no adverse effects. She does exhibit improved ankle dorsiflexion but continues with calf tightness. Therapy focused on manual and stretching for the left calf and ankle with patient reporting tightness and tenderness of the calf. Also progressed her calf stretching and updated her HEP to progress her stretching and intrinsic foot muscle training at home. Patient would benefit from continued skilled PT to progress her mobility and strength in order to reduce pain and maximize functional ability.    OBJECTIVE IMPAIRMENTS: Abnormal gait, decreased activity tolerance, decreased balance, decreased ROM, decreased strength, impaired flexibility, and pain.    ACTIVITY LIMITATIONS: standing, stairs, and locomotion level   PARTICIPATION LIMITATIONS: meal prep, shopping, community activity, and occupation   PERSONAL FACTORS: Fitness, Past/current experiences, and Time since onset of injury/illness/exacerbation are also affecting patient's functional outcome.      GOALS: Goals reviewed with patient? Yes   SHORT TERM GOALS: Target date: 07/16/2022   Patient will be I with initial HEP in order to progress with therapy. Baseline: HEP provided at eval Goal status: INITIAL   2.  Patient will demonstrate left ankle DF >/= 0 deg in order to improve gait and reduce heel pain Baseline: lacking 5 deg 07/03/2022: 0 deg Goal status: MET   3.  Patient will report left foot and heel pain </= 3/10 in order to reduce functional limitations Baseline: 6/10 Goal status: INITIAL   LONG TERM GOALS: Target date: 08/13/2022   Patient will be I with final HEP to maintain progress from PT. Baseline: HEP provided at eval Goal status: INITIAL   2.  Patient will report >/= 68% status on FOTO to indicate improved functional ability. Baseline: 54% functional status Goal status: INITIAL   3.  Patient will demonstrate left ankle strength 5/5 MMT in  order to improve standing and walking tolerance Baseline: limitations noted above Goal status: INITIAL   4.  Patient will exhibit left ankle DF >/= 5 deg in order to improve gait and reduce heel pain Baseline: lacking 5 deg Goal status: INITIAL     PLAN: PT FREQUENCY: 1x/week   PT DURATION: 8 weeks   PLANNED INTERVENTIONS: Therapeutic exercises, Therapeutic activity, Neuromuscular re-education, Balance training, Gait training, Patient/Family education, Self Care, Joint mobilization, Aquatic Therapy, Dry Needling, Ultrasound, Manual therapy, and Re-evaluation (Iontophoresis not covered under [employer])   PLAN FOR NEXT SESSION: Review HEP and progress PRN, calf stretching, ankle strengthening, progress to closed chain as tolerated, balance training   Rosana Hoes, PT, DPT, LAT, ATC 07/03/22  4:15 PM Phone: [phone] Fax: [phone]

## 2022-07-03 NOTE — Patient Instructions (Signed)
Access Code: [email or web address] URL: [email or web address] Date: 07/03/2022 Prepared by: Rosana Hoes  Exercises - Long Sitting Calf Stretch with Strap  - 2 x daily - 3 reps - 30 seconds hold - Long Sitting Ankle Plantar Flexion with Resistance  - 1 x daily - 3 sets - 10 reps - Seated Heel Toe Raises  - 1 x daily - 3 sets - 10 reps - Seated Toe Towel Scrunches  - 1 x weekly - 3 sets - 20 reps - Standing Gastroc Stretch at Counter  - 2 x daily - 3 reps - 30 seconds hold

## 2022-07-09 ENCOUNTER — Ambulatory Visit: Payer: Medicaid Other | Admitting: Rheumatology

## 2022-07-09 ENCOUNTER — Ambulatory Visit (HOSPITAL_BASED_OUTPATIENT_CLINIC_OR_DEPARTMENT_OTHER)
Admission: RE | Admit: 2022-07-09 | Discharge: 2022-07-09 | Disposition: A | Discharge status: Home / Self Care | Payer: Medicaid Other | Source: Ambulatory Visit | Attending: Rheumatology | Admitting: Rheumatology

## 2022-07-09 DIAGNOSIS — Z13820 Encounter for screening for osteoporosis: Secondary | ICD-10-CM | POA: Insufficient documentation

## 2022-07-09 DIAGNOSIS — Z780 Asymptomatic menopausal state: Secondary | ICD-10-CM | POA: Insufficient documentation

## 2022-07-10 ENCOUNTER — Ambulatory Visit: Payer: Medicaid Other | Admitting: Physical Therapy

## 2022-07-23 NOTE — Therapy (Signed)
OUTPATIENT PHYSICAL THERAPY TREATMENT NOTE   Patient Name: Sharon Bond MRN: [id number] DOB:02-Jul-1965, 57 y.o., female Today's Date: 07/24/2022  PCP: Arnette Felts, FNP REFERRING PROVIDER: Vivi Barrack, DPM   END OF SESSION:   PT End of Session - 07/24/22 1618     Visit Number 3    Number of Visits 9    Date for PT Re-Evaluation 08/13/22    Authorization Type [employer]    Authorization Time Period 06/24/2022 - 08/22/2022    Authorization - Visit Number 2    Authorization - Number of Visits 7    PT Start Time 1617    PT Stop Time 1655    PT Time Calculation (min) 38 min    Activity Tolerance Patient tolerated treatment well    Behavior During Therapy [facility] for tasks assessed/performed              Past Medical History:  Diagnosis Date   Allergy    pollen   Anxiety 2022   Eczema    GERD (gastroesophageal reflux disease)    Hypertension    IUD (intrauterine device) in place 2005   removed 2015   Mixed incontinence    Seasonal allergic rhinitis    Sleep apnea 10/2020   11/16/20--has not rec'd cpap yet   Wears glasses    Past Surgical History:  Procedure Laterality Date   DILATATION & CURETTAGE/HYSTEROSCOPY WITH TRUECLEAR N/A 01/08/2013   Procedure: DILATATION & CURETTAGE/HYSTEROSCOPY WITH TRUCLEAR, polypectomy;  Surgeon: Alphonsus Sias. Ernestina Penna, MD;  Location: [facility];  Service: Gynecology;  Laterality: N/A;   LAPAROSCOPIC TUBAL LIGATION Bilateral 01/08/2013   Procedure: DIAGNOSTIC LAPAROSCOPY Bilateral Salpingectomy;  Surgeon: Tresa Endo A. Ernestina Penna, MD;  Location: [facility];  Service: Gynecology;  Laterality: Bilateral;   PARTIAL HYSTERECTOMY  2019   WISDOM TOOTH EXTRACTION     Patient Active Problem List   Diagnosis Date Noted   Elevated cholesterol 05/13/2022   Temporal pain 02/25/2022   Pain in joint of left knee 10/12/2021   Obstructive sleep apnea hypopnea, moderate 10/03/2020   Insomnia secondary to chronic pain 06/28/2020   Psychophysiological insomnia  06/28/2020   Class 3 severe obesity due to excess calories without serious comorbidity with body mass index (BMI) of 40.0 to 44.9 in adult (HCC) 06/28/2020   Snoring 06/28/2020   Pain in right arm 05/31/2019   Dysmenorrhea 08/12/2017   Dyspareunia in female 08/12/2017   Fibroid uterus 08/12/2017   Menorrhagia with regular cycle 08/12/2017   Intrinsic eczema 02/27/2017   Obesity, Class III, BMI 40-49.9 (morbid obesity) (HCC) 02/13/2017   Elevated blood pressure 10/07/2012   Hypokalemia 03/20/2006   ANXIETY 03/20/2006   Essential hypertension 03/20/2006   Rhinitis, allergic 03/20/2006   GASTROESOPHAGEAL REFLUX, NO ESOPHAGITIS 03/20/2006   FIBROMYALGIA, FIBROMYOSITIS 03/20/2006   MUSCLE CRAMPS NOS 03/20/2006    REFERRING DIAG: Tendonitis of ankle, right; Plantar fasciitis   THERAPY DIAG:  Pain in left foot  Muscle weakness (generalized)  Rationale for Evaluation and Treatment Rehabilitation  PERTINENT HISTORY: See PMH above   PRECAUTIONS: None    SUBJECTIVE:  SUBJECTIVE STATEMENT:  Patient reports she was feeling better until today when she had to work of full day where she was on her feet for 8 hours. She states improvement in symptoms on days she works less hours. She is having less heel pain and feels her calf is tight on the left.  PAIN:  Are you having pain? Yes:  NPRS scale: 5/10 Pain location: Left foot/heel Pain description: Stabbing, pulsating  Aggravating factors: Standing extended periods,  Relieving factors: Rolling foot on ball or frozen water bottle   OBJECTIVE: (objective measures completed at initial evaluation unless otherwise dated) PATIENT SURVEYS:  FOTO 54 % functional status  07/24/2022: 52%   EDEMA:  Bilateral ankle edema noted   MUSCLE LENGTH: Calf flexibility deficit  on left   PALPATION: Tender to palpation plantar fascia and calcaneal tubercle   LOWER EXTREMITY ROM:   Active ROM Right eval Left eval Left 07/03/22 Left 07/24/2022  Hip flexion        Hip extension        Hip abduction        Hip adduction        Hip internal rotation        Hip external rotation        Knee flexion        Knee extension        Ankle dorsiflexion 8 Lacking 5 0 3  Ankle plantarflexion 55 53    Ankle inversion 25 25    Ankle eversion 10 10     (Blank rows = not tested)   LOWER EXTREMITY MMT:   MMT Right eval Left eval  Hip flexion      Hip extension      Hip abduction      Hip adduction      Hip internal rotation      Hip external rotation      Knee flexion      Knee extension      Ankle dorsiflexion 5 4  Ankle plantarflexion 4 2  Ankle inversion 5 4  Ankle eversion 5 4   (Blank rows = not tested)   FUNCTIONAL TESTS:  SLS: < 5 sec bilaterally   GAIT: Assistive device utilized: None Level of assistance: Complete Independence Comments: Bilateral toe out (L > R)     TODAY'S TREATMENT:    [facility] Adult PT Treatment:                                                DATE: 07/24/2022 Therapeutic Exercise: Slant board calf stretch 3 x 30 sec Ankle DF, inversion, eversion with red 2 x 15 each Ankle PF with green 2 x 15 Seated heel raise with 10#  2 x 15 Seated toe raises 2 x 15 Standing heel raise with ball squeeze 2 x 10 SLS with single UE support 3 x 15 seconds   [facility] Adult PT Treatment:                                                DATE: 07/03/2022 Therapeutic Exercise: Instruction on SMFR using tennis ball for left calf, patient reported tennis ball was too tender so used large red med ball Standing calf stretch  at counter 2 x 30 sec Seated toe raise x 10 Longsitting ankle DF with green 2 x 15 Seated towel scrunches 2 x 20 Manual: IASTM using tool for left calf Passive calf stretch in supine and prone with knee bent ART for left  calf  [facility] Adult PT Treatment:                                                DATE: 06/18/2022 Therapeutic Exercise: Longsitting calf stretch with towel 3 x 30 sec Longsitting ankle DF with red 2 x 10 Seated heel toe raises 2 x 10   PATIENT EDUCATION:  Education details: HEP update Person educated: Patient Education method: Explanation, Demonstration, Tactile cues, Verbal cues, Handout Education comprehension: verbalized understanding, returned demonstration, verbal cues required, tactile cues required, and needs further education   HOME EXERCISE PROGRAM: Access Code: [email or web address]      ASSESSMENT: CLINICAL IMPRESSION: Patient tolerated therapy well with no adverse effects. Therapy focused on continued calf stretching and strengthening for left ankle. She does exhibit improvement in her ankle DF but reports a slight reduction in functional ability on FOTO this visit. She was able to progress with her ankle strengthening and able to tolerate closed chain ankle strengthening, but does report burning of her calves and fatigue. She was unable to perform SLS without UE support this visit and does exhibit limitations in ankle control when in SLS. Updated HEP to progress to standing calf strengthening. Patient would benefit from continued skilled PT to progress her mobility and strength in order to reduce pain and maximize functional ability.    OBJECTIVE IMPAIRMENTS: Abnormal gait, decreased activity tolerance, decreased balance, decreased ROM, decreased strength, impaired flexibility, and pain.    ACTIVITY LIMITATIONS: standing, stairs, and locomotion level   PARTICIPATION LIMITATIONS: meal prep, shopping, community activity, and occupation   PERSONAL FACTORS: Fitness, Past/current experiences, and Time since onset of injury/illness/exacerbation are also affecting patient's functional outcome.      GOALS: Goals reviewed with patient? Yes   SHORT TERM GOALS: Target date: 07/16/2022   Patient  will be I with initial HEP in order to progress with therapy. Baseline: HEP provided at eval 07/24/2022: independent Goal status: MET   2.  Patient will demonstrate left ankle DF >/= 0 deg in order to improve gait and reduce heel pain Baseline: lacking 5 deg 07/03/2022: 0 deg Goal status: MET   3.  Patient will report left foot and heel pain </= 3/10 in order to reduce functional limitations Baseline: 6/10 07/24/2022: 5/10 Goal status: ONGOING   LONG TERM GOALS: Target date: 08/13/2022   Patient will be I with final HEP to maintain progress from PT. Baseline: HEP provided at eval Goal status: INITIAL   2.  Patient will report >/= 68% status on FOTO to indicate improved functional ability. Baseline: 54% functional status 07/24/2022: 52% Goal status: ONGOING   3.  Patient will demonstrate left ankle strength 5/5 MMT in order to improve standing and walking tolerance Baseline: limitations noted above Goal status: INITIAL   4.  Patient will exhibit left ankle DF >/= 5 deg in order to improve gait and reduce heel pain Baseline: lacking 5 deg Goal status: INITIAL     PLAN: PT FREQUENCY: 1x/week   PT DURATION: 8 weeks   PLANNED INTERVENTIONS: Therapeutic exercises, Therapeutic activity, Neuromuscular re-education, Balance training, Gait training,  Patient/Family education, Self Care, Joint mobilization, Aquatic Therapy, Dry Needling, Ultrasound, Manual therapy, and Re-evaluation (Iontophoresis not covered under [employer])   PLAN FOR NEXT SESSION: Review HEP and progress PRN, calf stretching, ankle strengthening, progress to closed chain as tolerated, balance training   Rosana Hoes, PT, DPT, LAT, ATC 07/24/22  4:56 PM Phone: [phone] Fax: [phone]

## 2022-07-24 ENCOUNTER — Other Ambulatory Visit: Payer: Self-pay

## 2022-07-24 ENCOUNTER — Encounter: Payer: Self-pay | Admitting: Physical Therapy

## 2022-07-24 ENCOUNTER — Ambulatory Visit: Payer: Medicaid Other | Attending: Podiatry | Admitting: Physical Therapy

## 2022-07-24 DIAGNOSIS — M79672 Pain in left foot: Secondary | ICD-10-CM | POA: Insufficient documentation

## 2022-07-24 DIAGNOSIS — M6281 Muscle weakness (generalized): Secondary | ICD-10-CM | POA: Diagnosis present

## 2022-07-24 NOTE — Patient Instructions (Addendum)
Access Code: [email or web address] URL: [email or web address] Date: 07/24/2022 Prepared by: Rosana Hoes  Exercises - Long Sitting Ankle Plantar Flexion with Resistance  - 1 x daily - 3 sets - 10 reps - Seated Heel Toe Raises  - 1 x daily - 3 sets - 10 reps - Seated Toe Towel Scrunches  - 1 x weekly - 3 sets - 20 reps - Standing Gastroc Stretch at Counter  - 2 x daily - 3 reps - 30 seconds hold - Heel Raises with Counter Support  - 1 x daily - 3 sets - 10 reps

## 2022-08-05 NOTE — Therapy (Signed)
OUTPATIENT PHYSICAL THERAPY TREATMENT NOTE   Patient Name: Sharon Bond MRN: [id number] DOB:1965-05-29, 57 y.o., female Today's Date: 08/06/2022  PCP: Arnette Felts, FNP REFERRING PROVIDER: Vivi Barrack, DPM   END OF SESSION:   PT End of Session - 08/06/22 1208     Visit Number 4    Number of Visits 9    Date for PT Re-Evaluation 08/13/22    Authorization Type [employer]    Authorization Time Period 06/24/2022 - 08/22/2022    Authorization - Visit Number 3    Authorization - Number of Visits 7    PT Start Time 1145    PT Stop Time 1225    PT Time Calculation (min) 40 min    Activity Tolerance Patient tolerated treatment well    Behavior During Therapy [facility] for tasks assessed/performed               Past Medical History:  Diagnosis Date   Allergy    pollen   Anxiety 2022   Eczema    GERD (gastroesophageal reflux disease)    Hypertension    IUD (intrauterine device) in place 2005   removed 2015   Mixed incontinence    Seasonal allergic rhinitis    Sleep apnea 10/2020   11/16/20--has not rec'd cpap yet   Wears glasses    Past Surgical History:  Procedure Laterality Date   DILATATION & CURETTAGE/HYSTEROSCOPY WITH TRUECLEAR N/A 01/08/2013   Procedure: DILATATION & CURETTAGE/HYSTEROSCOPY WITH TRUCLEAR, polypectomy;  Surgeon: Alphonsus Sias. Ernestina Penna, MD;  Location: [facility];  Service: Gynecology;  Laterality: N/A;   LAPAROSCOPIC TUBAL LIGATION Bilateral 01/08/2013   Procedure: DIAGNOSTIC LAPAROSCOPY Bilateral Salpingectomy;  Surgeon: Tresa Endo A. Ernestina Penna, MD;  Location: [facility];  Service: Gynecology;  Laterality: Bilateral;   PARTIAL HYSTERECTOMY  2019   WISDOM TOOTH EXTRACTION     Patient Active Problem List   Diagnosis Date Noted   Elevated cholesterol 05/13/2022   Temporal pain 02/25/2022   Pain in joint of left knee 10/12/2021   Obstructive sleep apnea hypopnea, moderate 10/03/2020   Insomnia secondary to chronic pain 06/28/2020   Psychophysiological  insomnia 06/28/2020   Class 3 severe obesity due to excess calories without serious comorbidity with body mass index (BMI) of 40.0 to 44.9 in adult (HCC) 06/28/2020   Snoring 06/28/2020   Pain in right arm 05/31/2019   Dysmenorrhea 08/12/2017   Dyspareunia in female 08/12/2017   Fibroid uterus 08/12/2017   Menorrhagia with regular cycle 08/12/2017   Intrinsic eczema 02/27/2017   Obesity, Class III, BMI 40-49.9 (morbid obesity) (HCC) 02/13/2017   Elevated blood pressure 10/07/2012   Hypokalemia 03/20/2006   ANXIETY 03/20/2006   Essential hypertension 03/20/2006   Rhinitis, allergic 03/20/2006   GASTROESOPHAGEAL REFLUX, NO ESOPHAGITIS 03/20/2006   FIBROMYALGIA, FIBROMYOSITIS 03/20/2006   MUSCLE CRAMPS NOS 03/20/2006    REFERRING DIAG: Tendonitis of ankle, right; Plantar fasciitis   THERAPY DIAG:  Pain in left foot  Muscle weakness (generalized)  Rationale for Evaluation and Treatment Rehabilitation  PERTINENT HISTORY: See PMH above   PRECAUTIONS: None    SUBJECTIVE:  SUBJECTIVE STATEMENT:  Patient reports just got a new pair of shoes that feel a lot better on her feet. She reports it is feeling better because she was able to stay off it yesterday.  PAIN:  Are you having pain? Yes:  NPRS scale: 4/10 Pain location: Left foot/heel Pain description: Stabbing, pulsating  Aggravating factors: Standing extended periods,  Relieving factors: Rolling foot on ball or frozen water bottle   OBJECTIVE: (objective measures completed at initial evaluation unless otherwise dated) PATIENT SURVEYS:  FOTO 54 % functional status  07/24/2022: 52%   EDEMA:  Bilateral ankle edema noted   MUSCLE LENGTH: Calf flexibility deficit on left   PALPATION: Tender to palpation plantar fascia and calcaneal tubercle    LOWER EXTREMITY ROM:   Active ROM Right eval Left eval Left 07/03/22 Left 07/24/2022 Left 08/06/22  Hip flexion         Hip extension         Hip abduction         Hip adduction         Hip internal rotation         Hip external rotation         Knee flexion         Knee extension         Ankle dorsiflexion 8 Lacking 5 0 3 5  Ankle plantarflexion 55 53   50  Ankle inversion 25 25   25   Ankle eversion 10 10   13    (Blank rows = not tested)   LOWER EXTREMITY MMT:   MMT Right eval Left eval Left 08/06/2022  Hip flexion       Hip extension       Hip abduction       Hip adduction       Hip internal rotation       Hip external rotation       Knee flexion       Knee extension       Ankle dorsiflexion 5 4 5   Ankle plantarflexion 4 2 3   Ankle inversion 5 4 4   Ankle eversion 5 4 4    (Blank rows = not tested)   FUNCTIONAL TESTS:  SLS: < 5 sec bilaterally  08/06/2022: 5 seconds on left, 20 seconds on right   GAIT: Assistive device utilized: None Level of assistance: Complete Independence Comments: Bilateral toe out (L > R)     TODAY'S TREATMENT:    [facility] Adult PT Treatment:                                                DATE: 08/06/2022 Therapeutic Exercise: NuStep L5 x 5 min LE only while taking subjective Slant board calf stretch 3 x 30 sec Ankle DF, inversion, eversion with red 2 x 15 each Ankle PF with green 2 x 15 Seated towel scrunches 2 x 20 Seated heel raise with 15#  2 x 15 Standing heel raise 3 x 15 SLS with single UE support 3 x 15 seconds on left   [facility] Adult PT Treatment:                                                DATE:  07/24/2022 Therapeutic Exercise: Slant board calf stretch 3 x 30 sec Ankle DF, inversion, eversion with red 2 x 15 each Ankle PF with green 2 x 15 Seated heel raise with 10#  2 x 15 Seated toe raises 2 x 15 Standing heel raise with ball squeeze 2 x 10 SLS with single UE support 3 x 15 seconds  [facility] Adult PT Treatment:                                                 DATE: 07/03/2022 Therapeutic Exercise: Instruction on SMFR using tennis ball for left calf, patient reported tennis ball was too tender so used large red med ball Standing calf stretch at counter 2 x 30 sec Seated toe raise x 10 Longsitting ankle DF with green 2 x 15 Seated towel scrunches 2 x 20 Manual: IASTM using tool for left calf Passive calf stretch in supine and prone with knee bent ART for left calf   PATIENT EDUCATION:  Education details: HEP update Person educated: Patient Education method: Explanation, Demonstration, Tactile cues, Verbal cues, Handout Education comprehension: verbalized understanding, returned demonstration, verbal cues required, tactile cues required, and needs further education   HOME EXERCISE PROGRAM: Access Code: [email or web address]      ASSESSMENT: CLINICAL IMPRESSION: Patient tolerated therapy well with no adverse effects. She demonstrates improvement in her left ankle motion and strength, with better SLS but continues with limitation on left. Therapy focused on continued calf stretching and ankle strengthening. She did report some pulling in the left heel with increased reps of heel raises this visit and continues to have difficulty with her SLS on left so performed with single UE support which still provided her a challenge. Updated her HEP to incorporate SLS at home. Patient would benefit from continued skilled PT to progress her mobility and strength in order to reduce pain and maximize functional ability.    OBJECTIVE IMPAIRMENTS: Abnormal gait, decreased activity tolerance, decreased balance, decreased ROM, decreased strength, impaired flexibility, and pain.    ACTIVITY LIMITATIONS: standing, stairs, and locomotion level   PARTICIPATION LIMITATIONS: meal prep, shopping, community activity, and occupation   PERSONAL FACTORS: Fitness, Past/current experiences, and Time since onset of injury/illness/exacerbation are also  affecting patient's functional outcome.      GOALS: Goals reviewed with patient? Yes   SHORT TERM GOALS: Target date: 07/16/2022   Patient will be I with initial HEP in order to progress with therapy. Baseline: HEP provided at eval 07/24/2022: independent Goal status: MET   2.  Patient will demonstrate left ankle DF >/= 0 deg in order to improve gait and reduce heel pain Baseline: lacking 5 deg 07/03/2022: 0 deg Goal status: MET   3.  Patient will report left foot and heel pain </= 3/10 in order to reduce functional limitations Baseline: 6/10 07/24/2022: 5/10 08/06/2022: 4/10 Goal status: ONGOING   LONG TERM GOALS: Target date: 08/13/2022   Patient will be I with final HEP to maintain progress from PT. Baseline: HEP provided at eval 08/06/2022: progressing Goal status: INITIAL   2.  Patient will report >/= 68% status on FOTO to indicate improved functional ability. Baseline: 54% functional status 07/24/2022: 52% Goal status: ONGOING   3.  Patient will demonstrate left ankle strength 5/5 MMT in order to improve standing and walking tolerance Baseline: limitations noted above 08/06/2022: see limitations above Goal  status: ONGOING   4.  Patient will exhibit left ankle DF >/= 5 deg in order to improve gait and reduce heel pain Baseline: lacking 5 deg 08/06/2022: 5 deg Goal status: MET     PLAN: PT FREQUENCY: 1x/week   PT DURATION: 8 weeks   PLANNED INTERVENTIONS: Therapeutic exercises, Therapeutic activity, Neuromuscular re-education, Balance training, Gait training, Patient/Family education, Self Care, Joint mobilization, Aquatic Therapy, Dry Needling, Ultrasound, Manual therapy, and Re-evaluation (Iontophoresis not covered under [employer])   PLAN FOR NEXT SESSION: Review HEP and progress PRN, calf stretching, ankle strengthening, progress to closed chain as tolerated, balance training   Rosana Hoes, PT, DPT, LAT, ATC 08/06/22  12:26 PM Phone: [phone] Fax:  [phone]

## 2022-08-06 ENCOUNTER — Encounter: Payer: Self-pay | Admitting: Physical Therapy

## 2022-08-06 ENCOUNTER — Ambulatory Visit: Payer: Medicaid Other | Admitting: Physical Therapy

## 2022-08-06 ENCOUNTER — Other Ambulatory Visit: Payer: Self-pay

## 2022-08-06 DIAGNOSIS — M79672 Pain in left foot: Secondary | ICD-10-CM | POA: Diagnosis not present

## 2022-08-06 DIAGNOSIS — M6281 Muscle weakness (generalized): Secondary | ICD-10-CM

## 2022-08-06 NOTE — Patient Instructions (Signed)
Access Code: [email or web address] URL: [email or web address] Date: 08/06/2022 Prepared by: Rosana Hoes  Exercises - Long Sitting Ankle Plantar Flexion with Resistance  - 1 x daily - 3 sets - 10 reps - Seated Heel Toe Raises  - 1 x daily - 3 sets - 10 reps - Seated Toe Towel Scrunches  - 1 x weekly - 3 sets - 20 reps - Standing Gastroc Stretch at Counter  - 2 x daily - 3 reps - 30 seconds hold - Heel Raises with Counter Support  - 1 x daily - 3 sets - 10 reps - Standing Single Leg Stance with Unilateral Counter Support  - 2 x daily - 3 reps - 15 seconds hold

## 2022-08-09 NOTE — Therapy (Signed)
OUTPATIENT PHYSICAL THERAPY TREATMENT NOTE   Patient Name: Sharon Bond MRN: [id number] DOB:Sep 12, 1965, 57 y.o., female Today's Date: 08/12/2022  PCP: Arnette Felts, FNP REFERRING PROVIDER: Vivi Barrack, DPM   END OF SESSION:   PT End of Session - 08/12/22 1154     Visit Number 5    Number of Visits 9    Date for PT Re-Evaluation 08/13/22    Authorization Type [employer]    Authorization Time Period 06/24/2022 - 08/22/2022    Authorization - Visit Number 4    Authorization - Number of Visits 7    PT Start Time 1149    PT Stop Time 1229    PT Time Calculation (min) 40 min    Activity Tolerance Patient tolerated treatment well    Behavior During Therapy [facility] for tasks assessed/performed                Past Medical History:  Diagnosis Date   Allergy    pollen   Anxiety 2022   Eczema    GERD (gastroesophageal reflux disease)    Hypertension    IUD (intrauterine device) in place 2005   removed 2015   Mixed incontinence    Seasonal allergic rhinitis    Sleep apnea 10/2020   11/16/20--has not rec'd cpap yet   Wears glasses    Past Surgical History:  Procedure Laterality Date   DILATATION & CURETTAGE/HYSTEROSCOPY WITH TRUECLEAR N/A 01/08/2013   Procedure: DILATATION & CURETTAGE/HYSTEROSCOPY WITH TRUCLEAR, polypectomy;  Surgeon: Alphonsus Sias. Ernestina Penna, MD;  Location: [facility];  Service: Gynecology;  Laterality: N/A;   LAPAROSCOPIC TUBAL LIGATION Bilateral 01/08/2013   Procedure: DIAGNOSTIC LAPAROSCOPY Bilateral Salpingectomy;  Surgeon: Tresa Endo A. Ernestina Penna, MD;  Location: [facility];  Service: Gynecology;  Laterality: Bilateral;   PARTIAL HYSTERECTOMY  2019   WISDOM TOOTH EXTRACTION     Patient Active Problem List   Diagnosis Date Noted   Elevated cholesterol 05/13/2022   Temporal pain 02/25/2022   Pain in joint of left knee 10/12/2021   Obstructive sleep apnea hypopnea, moderate 10/03/2020   Insomnia secondary to chronic pain 06/28/2020   Psychophysiological  insomnia 06/28/2020   Class 3 severe obesity due to excess calories without serious comorbidity with body mass index (BMI) of 40.0 to 44.9 in adult (HCC) 06/28/2020   Snoring 06/28/2020   Pain in right arm 05/31/2019   Dysmenorrhea 08/12/2017   Dyspareunia in female 08/12/2017   Fibroid uterus 08/12/2017   Menorrhagia with regular cycle 08/12/2017   Intrinsic eczema 02/27/2017   Obesity, Class III, BMI 40-49.9 (morbid obesity) (HCC) 02/13/2017   Elevated blood pressure 10/07/2012   Hypokalemia 03/20/2006   ANXIETY 03/20/2006   Essential hypertension 03/20/2006   Rhinitis, allergic 03/20/2006   GASTROESOPHAGEAL REFLUX, NO ESOPHAGITIS 03/20/2006   FIBROMYALGIA, FIBROMYOSITIS 03/20/2006   MUSCLE CRAMPS NOS 03/20/2006    REFERRING DIAG: Tendonitis of ankle, right; Plantar fasciitis   THERAPY DIAG:  Pain in left foot  Muscle weakness (generalized)  Rationale for Evaluation and Treatment Rehabilitation  PERTINENT HISTORY: See PMH above   PRECAUTIONS: None    SUBJECTIVE:  SUBJECTIVE STATEMENT:  Patient reports she is feeling better.   PAIN:  Are you having pain? Yes:  NPRS scale: 2/10 Pain location: Left foot/heel Pain description: Stabbing, pulsating  Aggravating factors: Standing extended periods,  Relieving factors: Rolling foot on ball or frozen water bottle   OBJECTIVE: (objective measures completed at initial evaluation unless otherwise dated) PATIENT SURVEYS:  FOTO 54 % functional status  07/24/2022: 52%  08/12/2022: 54%   EDEMA:  Bilateral ankle edema noted   MUSCLE LENGTH: Calf flexibility deficit on left   PALPATION: Tender to palpation plantar fascia and calcaneal tubercle   LOWER EXTREMITY ROM:   Active ROM Right eval Left eval Left 07/03/22 Left 07/24/2022 Left 08/06/22   Hip flexion         Hip extension         Hip abduction         Hip adduction         Hip internal rotation         Hip external rotation         Knee flexion         Knee extension         Ankle dorsiflexion 8 Lacking 5 0 3 5  Ankle plantarflexion 55 53   50  Ankle inversion 25 25   25   Ankle eversion 10 10   13    (Blank rows = not tested)   LOWER EXTREMITY MMT:   MMT Right eval Left eval Left 08/06/2022  Hip flexion       Hip extension       Hip abduction       Hip adduction       Hip internal rotation       Hip external rotation       Knee flexion       Knee extension       Ankle dorsiflexion 5 4 5   Ankle plantarflexion 4 2 3   Ankle inversion 5 4 4   Ankle eversion 5 4 4    (Blank rows = not tested)   FUNCTIONAL TESTS:  SLS: < 5 sec bilaterally  08/06/2022: 5 seconds on left, 20 seconds on right   GAIT: Assistive device utilized: None Level of assistance: Complete Independence Comments: Bilateral toe out (L > R)     TODAY'S TREATMENT:    [facility] Adult PT Treatment:                                                DATE: 08/12/2022 Therapeutic Exercise: NuStep L5 x 5 min LE only while taking subjective Slant board calf stretch 3 x 30 sec Standing heel raise 3 x 20 SLS with single UE support 3 x 15 seconds each Ankle DF, inversion, eversion with red 2 x 20 each Seated heel raise with 15#  2 x 20   [facility] Adult PT Treatment:                                                DATE: 08/06/2022 Therapeutic Exercise: NuStep L5 x 5 min LE only while taking subjective Slant board calf stretch 3 x 30 sec Ankle DF, inversion, eversion with red 2 x 15 each Ankle PF with green 2  x 15 Seated towel scrunches 2 x 20 Seated heel raise with 15#  2 x 15 Standing heel raise 3 x 15 SLS with single UE support 3 x 15 seconds on left  [facility] Adult PT Treatment:                                                DATE: 07/24/2022 Therapeutic Exercise: Slant board calf stretch 3 x 30 sec Ankle  DF, inversion, eversion with red 2 x 15 each Ankle PF with green 2 x 15 Seated heel raise with 10#  2 x 15 Seated toe raises 2 x 15 Standing heel raise with ball squeeze 2 x 10 SLS with single UE support 3 x 15 seconds   PATIENT EDUCATION:  Education details: HEP Person educated: Patient Education method: Programmer, multimedia, Demonstration, Actor cues, Verbal cues Education comprehension: verbalized understanding, returned demonstration, verbal cues required, tactile cues required, and needs further education   HOME EXERCISE PROGRAM: Access Code: [email or web address]      ASSESSMENT: CLINICAL IMPRESSION: Patient tolerated therapy well with no adverse effects. She does report improvement in functional level on FOTO this visit. Therapy focused on progressing calf flexibility and ankle strengthening with good tolerance. She does report feeling of calf burning and cramp like sensation with calf strengthening. She tolerate the SLS better this visit but continues to report greater difficulty on left with inability to hold without UE support. No changes to HEP this visit. Patient would benefit from continued skilled PT to progress her mobility and strength in order to reduce pain and maximize functional ability.    OBJECTIVE IMPAIRMENTS: Abnormal gait, decreased activity tolerance, decreased balance, decreased ROM, decreased strength, impaired flexibility, and pain.    ACTIVITY LIMITATIONS: standing, stairs, and locomotion level   PARTICIPATION LIMITATIONS: meal prep, shopping, community activity, and occupation   PERSONAL FACTORS: Fitness, Past/current experiences, and Time since onset of injury/illness/exacerbation are also affecting patient's functional outcome.      GOALS: Goals reviewed with patient? Yes   SHORT TERM GOALS: Target date: 07/16/2022   Patient will be I with initial HEP in order to progress with therapy. Baseline: HEP provided at eval 07/24/2022: independent Goal status: MET   2.   Patient will demonstrate left ankle DF >/= 0 deg in order to improve gait and reduce heel pain Baseline: lacking 5 deg 07/03/2022: 0 deg Goal status: MET   3.  Patient will report left foot and heel pain </= 3/10 in order to reduce functional limitations Baseline: 6/10 07/24/2022: 5/10 08/06/2022: 4/10 Goal status: ONGOING   LONG TERM GOALS: Target date: 08/13/2022   Patient will be I with final HEP to maintain progress from PT. Baseline: HEP provided at eval 08/06/2022: progressing Goal status: INITIAL   2.  Patient will report >/= 68% status on FOTO to indicate improved functional ability. Baseline: 54% functional status 07/24/2022: 52% 08/12/2022: 54% Goal status: ONGOING   3.  Patient will demonstrate left ankle strength 5/5 MMT in order to improve standing and walking tolerance Baseline: limitations noted above 08/06/2022: see limitations above Goal status: ONGOING   4.  Patient will exhibit left ankle DF >/= 5 deg in order to improve gait and reduce heel pain Baseline: lacking 5 deg 08/06/2022: 5 deg Goal status: MET     PLAN: PT FREQUENCY: 1x/week   PT DURATION: 8 weeks  PLANNED INTERVENTIONS: Therapeutic exercises, Therapeutic activity, Neuromuscular re-education, Balance training, Gait training, Patient/Family education, Self Care, Joint mobilization, Aquatic Therapy, Dry Needling, Ultrasound, Manual therapy, and Re-evaluation (Iontophoresis not covered under [employer])   PLAN FOR NEXT SESSION: Review HEP and progress PRN, calf stretching, ankle strengthening, progress to closed chain as tolerated, balance training   Rosana Hoes, PT, DPT, LAT, ATC 08/12/22  12:41 PM Phone: [phone] Fax: [phone]

## 2022-08-12 ENCOUNTER — Ambulatory Visit: Payer: Medicaid Other | Admitting: Physical Therapy

## 2022-08-12 ENCOUNTER — Other Ambulatory Visit: Payer: Self-pay

## 2022-08-12 ENCOUNTER — Encounter: Payer: Self-pay | Admitting: Physical Therapy

## 2022-08-12 DIAGNOSIS — M6281 Muscle weakness (generalized): Secondary | ICD-10-CM

## 2022-08-12 DIAGNOSIS — M79672 Pain in left foot: Secondary | ICD-10-CM | POA: Diagnosis not present

## 2022-08-16 NOTE — Therapy (Signed)
OUTPATIENT PHYSICAL THERAPY TREATMENT NOTE  DISCHARGE   Patient Name: Sharon Bond MRN: [id number] DOB:08/28/1965, 57 y.o., female Today's Date: 08/19/2022  PCP: Arnette Felts, FNP REFERRING PROVIDER: Vivi Barrack, DPM   END OF SESSION:   PT End of Session - 08/19/22 1149     Visit Number 6    Number of Visits 9    Date for PT Re-Evaluation 08/19/22    Authorization Type [employer]    Authorization Time Period 06/24/2022 - 08/22/2022    Authorization - Visit Number 5    Authorization - Number of Visits 7    PT Start Time 1149    PT Stop Time 1229    PT Time Calculation (min) 40 min    Activity Tolerance Patient tolerated treatment well    Behavior During Therapy [facility] for tasks assessed/performed                 Past Medical History:  Diagnosis Date   Allergy    pollen   Anxiety 2022   Eczema    GERD (gastroesophageal reflux disease)    Hypertension    IUD (intrauterine device) in place 2005   removed 2015   Mixed incontinence    Seasonal allergic rhinitis    Sleep apnea 10/2020   11/16/20--has not rec'd cpap yet   Wears glasses    Past Surgical History:  Procedure Laterality Date   DILATATION & CURETTAGE/HYSTEROSCOPY WITH TRUECLEAR N/A 01/08/2013   Procedure: DILATATION & CURETTAGE/HYSTEROSCOPY WITH TRUCLEAR, polypectomy;  Surgeon: Alphonsus Sias. Ernestina Penna, MD;  Location: [facility];  Service: Gynecology;  Laterality: N/A;   LAPAROSCOPIC TUBAL LIGATION Bilateral 01/08/2013   Procedure: DIAGNOSTIC LAPAROSCOPY Bilateral Salpingectomy;  Surgeon: Tresa Endo A. Ernestina Penna, MD;  Location: [facility];  Service: Gynecology;  Laterality: Bilateral;   PARTIAL HYSTERECTOMY  2019   WISDOM TOOTH EXTRACTION     Patient Active Problem List   Diagnosis Date Noted   Elevated cholesterol 05/13/2022   Temporal pain 02/25/2022   Pain in joint of left knee 10/12/2021   Obstructive sleep apnea hypopnea, moderate 10/03/2020   Insomnia secondary to chronic pain 06/28/2020    Psychophysiological insomnia 06/28/2020   Class 3 severe obesity due to excess calories without serious comorbidity with body mass index (BMI) of 40.0 to 44.9 in adult (HCC) 06/28/2020   Snoring 06/28/2020   Pain in right arm 05/31/2019   Dysmenorrhea 08/12/2017   Dyspareunia in female 08/12/2017   Fibroid uterus 08/12/2017   Menorrhagia with regular cycle 08/12/2017   Intrinsic eczema 02/27/2017   Obesity, Class III, BMI 40-49.9 (morbid obesity) (HCC) 02/13/2017   Elevated blood pressure 10/07/2012   Hypokalemia 03/20/2006   ANXIETY 03/20/2006   Essential hypertension 03/20/2006   Rhinitis, allergic 03/20/2006   GASTROESOPHAGEAL REFLUX, NO ESOPHAGITIS 03/20/2006   FIBROMYALGIA, FIBROMYOSITIS 03/20/2006   MUSCLE CRAMPS NOS 03/20/2006    REFERRING DIAG: Tendonitis of ankle, right; Plantar fasciitis   THERAPY DIAG:  Pain in left foot  Muscle weakness (generalized)  Rationale for Evaluation and Treatment Rehabilitation  PERTINENT HISTORY: See PMH above   PRECAUTIONS: None    SUBJECTIVE:  SUBJECTIVE STATEMENT:  Patient reports she is feeling better. It is more the calf that is tight and giving her trouble. The stretching has definitely helped and it doesn't feel as tight as it was.   PAIN:  Are you having pain? Yes:  NPRS scale: 2/10 Pain location: Left foot/heel Pain description: Stabbing, pulsating  Aggravating factors: Standing extended periods,  Relieving factors: Rolling foot on ball or frozen water bottle   OBJECTIVE: (objective measures completed at initial evaluation unless otherwise dated) PATIENT SURVEYS:  FOTO 54 % functional status  07/24/2022: 52%  08/12/2022: 54%  08/19/2022: 65%   EDEMA:  Bilateral ankle edema noted   MUSCLE LENGTH: Calf flexibility deficit on left    PALPATION: Tender to palpation plantar fascia and calcaneal tubercle   LOWER EXTREMITY ROM:   Active ROM Right eval Left eval Left 07/03/22 Left 07/24/2022 Left 08/06/22  Hip flexion         Hip extension         Hip abduction         Hip adduction         Hip internal rotation         Hip external rotation         Knee flexion         Knee extension         Ankle dorsiflexion 8 Lacking 5 0 3 5  Ankle plantarflexion 55 53   50  Ankle inversion 25 25   25   Ankle eversion 10 10   13    (Blank rows = not tested)   LOWER EXTREMITY MMT:   MMT Right eval Left eval Left 08/06/2022 Left 08/19/2022  Hip flexion        Hip extension        Hip abduction        Hip adduction        Hip internal rotation        Hip external rotation        Knee flexion        Knee extension        Ankle dorsiflexion 5 4 5 5   Ankle plantarflexion 4 2 3 3   Ankle inversion 5 4 4  4+  Ankle eversion 5 4 4 4    (Blank rows = not tested)   FUNCTIONAL TESTS:  SLS: < 5 sec bilaterally  08/06/2022: 5 seconds on left, 20 seconds on right  08/19/2022: 10 seconds on left   GAIT: Assistive device utilized: None Level of assistance: Complete Independence Comments: Bilateral toe out (L > R)     TODAY'S TREATMENT:    [facility] Adult PT Treatment:                                                DATE: 08/19/2022 Therapeutic Exercise: NuStep L6 x 5 min LE only while taking subjective Slant board calf stretch 3 x 30 sec Standing heel raise 3 x 20 SLS with single UE support 3 x 15 seconds each Ankle PF with blue 2 x 20 Ankle inversion and eversion with red 2 x 20 each Seated heel raise with 25#  2 x 20   [facility] Adult PT Treatment:  DATE: 08/12/2022 Therapeutic Exercise: NuStep L5 x 5 min LE only while taking subjective Slant board calf stretch 3 x 30 sec Standing heel raise 3 x 20 SLS with single UE support 3 x 15 seconds each Ankle DF, inversion, eversion with red  2 x 20 each Seated heel raise with 15#  2 x 20  [facility] Adult PT Treatment:                                                DATE: 08/06/2022 Therapeutic Exercise: NuStep L5 x 5 min LE only while taking subjective Slant board calf stretch 3 x 30 sec Ankle DF, inversion, eversion with red 2 x 15 each Ankle PF with green 2 x 15 Seated towel scrunches 2 x 20 Seated heel raise with 15#  2 x 15 Standing heel raise 3 x 15 SLS with single UE support 3 x 15 seconds on left  [facility] Adult PT Treatment:                                                DATE: 07/24/2022 Therapeutic Exercise: Slant board calf stretch 3 x 30 sec Ankle DF, inversion, eversion with red 2 x 15 each Ankle PF with green 2 x 15 Seated heel raise with 10#  2 x 15 Seated toe raises 2 x 15 Standing heel raise with ball squeeze 2 x 10 SLS with single UE support 3 x 15 seconds   PATIENT EDUCATION:  Education details: POC discharge, HEP Person educated: Patient Education method: Explanation Education comprehension: Verbalized understanding   HOME EXERCISE PROGRAM: Access Code: [email or web address]      ASSESSMENT: CLINICAL IMPRESSION: Patient tolerated therapy well with no adverse effects. She has made progress in therapy demonstrating improved left ankle motion and strength, and reporting an improvement in her functional ability. She reports her primary issues at this point with calf tightness and she is independent with her HEP so will transition to home program to continue working on her calf flexibility and strength. No changes made to HEP this visit but patient was provided a stronger band for calf strengthening exercise. Patient will be formally discharged from PT as she is pleased with her current functional status and will follow-up with her referring provided as needed.    OBJECTIVE IMPAIRMENTS: Abnormal gait, decreased activity tolerance, decreased balance, decreased ROM, decreased strength, impaired flexibility, and pain.     ACTIVITY LIMITATIONS: standing, stairs, and locomotion level   PARTICIPATION LIMITATIONS: meal prep, shopping, community activity, and occupation   PERSONAL FACTORS: Fitness, Past/current experiences, and Time since onset of injury/illness/exacerbation are also affecting patient's functional outcome.      GOALS: Goals reviewed with patient? Yes   SHORT TERM GOALS: Target date: 07/16/2022   Patient will be I with initial HEP in order to progress with therapy. Baseline: HEP provided at eval 07/24/2022: independent Goal status: MET   2.  Patient will demonstrate left ankle DF >/= 0 deg in order to improve gait and reduce heel pain Baseline: lacking 5 deg 07/03/2022: 0 deg Goal status: MET   3.  Patient will report left foot and heel pain </= 3/10 in order to reduce functional limitations Baseline: 6/10 07/24/2022: 5/10 08/06/2022: 4/10 08/19/2022:  2/10 Goal status: MET   LONG TERM GOALS: Target date: 08/13/2022   Patient will be I with final HEP to maintain progress from PT. Baseline: HEP provided at eval 08/06/2022: progressing 08/19/2022: independent Goal status: MET   2.  Patient will report >/= 68% status on FOTO to indicate improved functional ability. Baseline: 54% functional status 07/24/2022: 52% 08/12/2022: 54% 08/19/2022: 65% Goal status: PARTIALLY MET   3.  Patient will demonstrate left ankle strength 5/5 MMT in order to improve standing and walking tolerance Baseline: limitations noted above 08/06/2022: see limitations above 08/19/2022:  see above Goal status: PARTIALLY MET   4.  Patient will exhibit left ankle DF >/= 5 deg in order to improve gait and reduce heel pain Baseline: lacking 5 deg 08/06/2022: 5 deg Goal status: MET     PLAN: PT FREQUENCY: 1x/week   PT DURATION: 8 weeks   PLANNED INTERVENTIONS: Therapeutic exercises, Therapeutic activity, Neuromuscular re-education, Balance training, Gait training, Patient/Family education, Self Care, Joint  mobilization, Aquatic Therapy, Dry Needling, Ultrasound, Manual therapy, and Re-evaluation (Iontophoresis not covered under [employer])   PLAN FOR NEXT SESSION: NA - discharge   Rosana Hoes, PT, DPT, LAT, ATC 08/19/22  12:30 PM Phone: [phone] Fax: [phone]    PHYSICAL THERAPY DISCHARGE SUMMARY  Visits from Start of Care: 6  Current functional level related to goals / functional outcomes: See above   Remaining deficits: See above   Education / Equipment: HEP   Patient agrees to discharge. Patient goals were partially met. Patient is being discharged due to being pleased with the current functional level.

## 2022-08-19 ENCOUNTER — Ambulatory Visit: Payer: Medicaid Other | Admitting: Physical Therapy

## 2022-08-19 ENCOUNTER — Encounter: Payer: Self-pay | Admitting: Physical Therapy

## 2022-08-19 ENCOUNTER — Other Ambulatory Visit: Payer: Self-pay

## 2022-08-19 DIAGNOSIS — M79672 Pain in left foot: Secondary | ICD-10-CM | POA: Diagnosis not present

## 2022-08-19 DIAGNOSIS — M6281 Muscle weakness (generalized): Secondary | ICD-10-CM

## 2022-08-30 ENCOUNTER — Other Ambulatory Visit: Payer: Self-pay | Admitting: Podiatry

## 2022-11-05 NOTE — Progress Notes (Signed)
Madelaine Bhat, CMA,acting as a Neurosurgeon for Arnette Felts, FNP.,have documented all relevant documentation on the behalf of Arnette Felts, FNP,as directed by  Arnette Felts, FNP while in the presence of Arnette Felts, FNP.  Subjective:  Patient ID: Sharon Bond , female    DOB: [birth year] , 57 y.o.   MRN: [id number]  Chief Complaint  Patient presents with   Hypertension    HPI  Patient presents today for a bp and pre dm follow up, Patient reports compliance with medication. Patient reports she has been having shortness of breath, chest pain, and back pain for about a week. Patient reports she has a cough and her chest and back are tight. She reports she has a runny nose as well for about one week.  Patient reports she had a total hysterectomy in 2019 at [facility]   BP Readings from Last 3 Encounters: 11/06/22 : 120/70 06/04/22 : 132/88 05/13/22 : 132/88        Past Medical History:  Diagnosis Date   Allergy    pollen   Anxiety 2022   Eczema    GERD (gastroesophageal reflux disease)    Hypertension    IUD (intrauterine device) in place 2005   removed 2015   Mixed incontinence    Seasonal allergic rhinitis    Sleep apnea 10/2020   11/16/20--has not rec'd cpap yet   Wears glasses      Family History  Problem Relation Age of Onset   Diabetes Mother    Hypertension Mother    Alzheimer's disease Mother    Healthy Father    Diabetes Brother    Heart disease Maternal Aunt    Healthy Son    Healthy Son    Hypertension Daughter    Healthy Daughter    Cancer Neg Hx    Stroke Neg Hx    Sleep apnea Neg Hx    Colon cancer Neg Hx    Colon polyps Neg Hx    Esophageal cancer Neg Hx    Rectal cancer Neg Hx    Stomach cancer Neg Hx      Current Outpatient Medications:    busPIRone (BUSPAR) 10 MG tablet, TAKE 1 TABLET BY MOUTH EVERY DAY, Disp: 90 tablet, Rfl: 3   diltiazem (CARDIZEM) 30 MG tablet, Take 1 tablet (30 mg total) by mouth in the morning and at bedtime., Disp: 180  tablet, Rfl: 2   EPINEPHrine 0.3 mg/0.3 mL IJ SOAJ injection, Inject 0.3 mg into the muscle as needed for anaphylaxis., Disp: 1 each, Rfl: 1   Fluocinolone Acetonide (DERMOTIC) 0.01 % OIL, Place 5 drops in ear(s) 2 (two) times daily as needed (Itching in ears)., Disp: 20 mL, Rfl: 0   fluticasone (FLONASE) 50 MCG/ACT nasal spray, Place 1 spray into both nostrils daily., Disp: 47.4 mL, Rfl: 1   Lactobacillus (PROBIOTIC ACIDOPHILUS PO), Take by mouth as needed., Disp: , Rfl:    meloxicam (MOBIC) 15 MG tablet, TAKE 1 TABLET BY MOUTH EVERY DAY AS NEEDED FOR PAIN, Disp: 30 tablet, Rfl: 1   Nebivolol HCl (BYSTOLIC) 20 MG TABS, Take 1 tablet (20 mg total) by mouth daily., Disp: 90 tablet, Rfl: 3   spironolactone (ALDACTONE) 25 MG tablet, Take 1 tablet (25 mg total) by mouth daily., Disp: 90 tablet, Rfl: 3   triamcinolone cream (KENALOG) 0.1 %, Apply 1 Application topically as needed. Pt using at least 4 times a day, Disp: 30 g, Rfl: 5   valsartan (DIOVAN) 320 MG tablet, Take  1 tablet (320 mg total) by mouth daily., Disp: 90 tablet, Rfl: 1   Vitamin D, Ergocalciferol, (DRISDOL) 1.25 MG (50000 UNIT) CAPS capsule, Take 1 capsule (50,000 Units total) by mouth 2 (two) times a week., Disp: 24 capsule, Rfl: 1   albuterol (VENTOLIN HFA) 108 (90 Base) MCG/ACT inhaler, Inhale 2 puffs into the lungs every 6 (six) hours as needed for wheezing or shortness of breath., Disp: 18 g, Rfl: 2   cetirizine (ZYRTEC ALLERGY) 10 MG tablet, Take 1 tablet (10 mg total) by mouth at bedtime., Disp: 90 tablet, Rfl: 1   omeprazole (PRILOSEC OTC) 20 MG tablet, Take 1 tablet (20 mg total) by mouth daily. (Patient not taking: Reported on 05/13/2022), Disp: 30 tablet, Rfl: 1   Allergies  Allergen Reactions   Olmesartan     "cough"   Amlodipine Cough   Penicillins Rash and Other (See Comments)    Hives, rash. No airway symptoms      Review of Systems  Constitutional: Negative.   HENT:  Negative for ear pain.   Respiratory:  Negative.    Cardiovascular:  Positive for chest pain.  Gastrointestinal: Negative.   Musculoskeletal:  Positive for back pain (radiating to the front of her chest).  Neurological:  Negative for dizziness and headaches.  Psychiatric/Behavioral: Negative.       Today's Vitals   11/06/22 1452  BP: 120/70  Pulse: 80  Temp: 97.9 F (36.6 C)  TempSrc: Oral  Weight: 237 lb 12.8 oz (107.9 kg)  Height: 5\' 2"  (1.575 m)  PainSc: 4   PainLoc: Back   Body mass index is 43.49 kg/m.  Wt Readings from Last 3 Encounters:  11/06/22 237 lb 12.8 oz (107.9 kg)  06/04/22 235 lb (106.6 kg)  05/13/22 235 lb (106.6 kg)     Objective:  Physical Exam Vitals reviewed.  Constitutional:      General: She is not in acute distress.    Appearance: Normal appearance. She is obese.  Cardiovascular:     Rate and Rhythm: Normal rate and regular rhythm.     Pulses: Normal pulses.     Heart sounds: Normal heart sounds. No murmur heard. Pulmonary:     Effort: Pulmonary effort is normal. No respiratory distress.     Breath sounds: Normal breath sounds. No wheezing.  Skin:    General: Skin is warm and dry.     Capillary Refill: Capillary refill takes less than 2 seconds.  Neurological:     General: No focal deficit present.     Mental Status: She is alert and oriented to person, place, and time.     Cranial Nerves: No cranial nerve deficit.     Motor: No weakness.  Psychiatric:        Mood and Affect: Mood normal.        Behavior: Behavior normal.        Thought Content: Thought content normal.        Judgment: Judgment normal.         Assessment And Plan:  Essential hypertension Assessment & Plan: Blood pressure is well-controlled.  Continue current medications.  Orders: -     Basic metabolic panel -     DG Chest 2 View; Future  Abnormal glucose Assessment & Plan: Hemoglobin A1c was slightly increased at last visit.  She is currently focusing on her diet and exercise.  Will recheck  levels today.  Orders: -     Hemoglobin A1c -     DG  Chest 2 View; Future  Elevated cholesterol Assessment & Plan: Cholesterol levels had decreased slightly at last visit.  Should have said that the L2 she might respond.  Continue focusing on low-fat diet.  Increase fiber intake as well  Orders: -     Lipid panel -     DG Chest 2 View; Future  Shortness of breath Assessment & Plan: She is to pick up the albuterol inhaler and if not better return call to office and will send Rx for steroids.   Orders: -     EKG 12-Lead -     Albuterol Sulfate HFA; Inhale 2 puffs into the lungs every 6 (six) hours as needed for wheezing or shortness of breath.  Dispense: 18 g; Refill: 2 -     DG Chest 2 View; Future  Chest pain, unspecified type Assessment & Plan: EKG done heart rate 70.  Sinus rhythm concerns that she may have pulmonary issues.  She will also go for a chest x-ray.  She is having a cough as well.  Orders: -     EKG 12-Lead  Influenza vaccination declined Assessment & Plan: Patient declined influenza vaccination at this time. Patient is aware that influenza vaccine prevents illness in 70% of healthy people, and reduces hospitalizations to 30-70% in elderly. This vaccine is recommended annually. Education has been provided regarding the importance of this vaccine but patient still declined. Advised may receive this vaccine at local pharmacy or Health Dept.or vaccine clinic. Aware to provide a copy of the vaccination record if obtained from local pharmacy or Health Dept.  Pt is willing to accept risk associated with refusing vaccination.    Acute cough Assessment & Plan: She is to use her albuterol inhaler   Herpes zoster vaccination declined Assessment & Plan: Declines shingrix, educated on disease process and is aware if he changes his mind to notify office    COVID-19 vaccination declined Assessment & Plan: Declines covid 19 vaccine. Discussed risk of covid 87 and if  she changes her mind about the vaccine to call the office. Education has been provided regarding the importance of this vaccine but patient still declined. Advised may receive this vaccine at local pharmacy or Health Dept.or vaccine clinic. Aware to provide a copy of the vaccination record if obtained from local pharmacy or Health Dept.  Encouraged to take multivitamin, vitamin d, vitamin c and zinc to increase immune system. Aware can call office if would like to have vaccine here at office. Verbalized acceptance and understanding.    Tetanus, diphtheria, and acellular pertussis (Tdap) vaccination declined  Class 3 severe obesity due to excess calories with serious comorbidity and body mass index (BMI) of 40.0 to 44.9 in adult [facility]) Assessment & Plan: She is encouraged to strive for BMI less than 30 to decrease cardiac risk. Advised to aim for at least 150 minutes of exercise per week.      Return for 6 month bp check.  Patient was given opportunity to ask questions. Patient verbalized understanding of the plan and was able to repeat key elements of the plan. All questions were answered to their satisfaction.    Jeanell Sparrow, FNP, have reviewed all documentation for this visit. The documentation on 11/06/22 for the exam, diagnosis, procedures, and orders are all accurate and complete.   IF YOU HAVE BEEN REFERRED TO A SPECIALIST, IT MAY TAKE 1-2 WEEKS TO SCHEDULE/PROCESS THE REFERRAL. IF YOU HAVE NOT HEARD FROM US/SPECIALIST IN TWO WEEKS, PLEASE GIVE [place]  A CALL AT [phone].

## 2022-11-06 ENCOUNTER — Encounter: Payer: Self-pay | Admitting: Nurse Practitioner

## 2022-11-06 ENCOUNTER — Ambulatory Visit: Payer: Medicaid Other | Admitting: Nurse Practitioner

## 2022-11-06 ENCOUNTER — Ambulatory Visit
Admission: RE | Admit: 2022-11-06 | Discharge: 2022-11-06 | Disposition: A | Discharge status: Home / Self Care | Payer: Medicaid Other | Source: Ambulatory Visit | Attending: Nurse Practitioner | Admitting: Nurse Practitioner

## 2022-11-06 VITALS — BP 120/70 | HR 80 | Temp 97.9°F | Ht 62.0 in | Wt 237.8 lb

## 2022-11-06 DIAGNOSIS — R079 Chest pain, unspecified: Secondary | ICD-10-CM | POA: Diagnosis not present

## 2022-11-06 DIAGNOSIS — R0602 Shortness of breath: Secondary | ICD-10-CM

## 2022-11-06 DIAGNOSIS — E66813 Obesity, class 3: Secondary | ICD-10-CM

## 2022-11-06 DIAGNOSIS — Z6841 Body Mass Index (BMI) 40.0 and over, adult: Secondary | ICD-10-CM

## 2022-11-06 DIAGNOSIS — R7309 Other abnormal glucose: Secondary | ICD-10-CM | POA: Diagnosis not present

## 2022-11-06 DIAGNOSIS — I10 Essential (primary) hypertension: Secondary | ICD-10-CM | POA: Diagnosis not present

## 2022-11-06 DIAGNOSIS — E7800 Pure hypercholesterolemia, unspecified: Secondary | ICD-10-CM

## 2022-11-06 DIAGNOSIS — Z2821 Immunization not carried out because of patient refusal: Secondary | ICD-10-CM

## 2022-11-06 DIAGNOSIS — R051 Acute cough: Secondary | ICD-10-CM

## 2022-11-06 MED ORDER — ALBUTEROL SULFATE HFA 108 (90 BASE) MCG/ACT IN AERS
2.0000 | INHALATION_SPRAY | Freq: Four times a day (QID) | RESPIRATORY_TRACT | 2 refills | Status: AC | PRN
Start: 2022-11-06 — End: ?

## 2022-11-06 NOTE — Assessment & Plan Note (Signed)
She is to pick up the albuterol inhaler and if not better return call to office and will send Rx for steroids.

## 2022-11-07 LAB — BASIC METABOLIC PANEL
BUN/Creatinine Ratio: 18 (ref 9–23)
BUN: 16 mg/dL (ref 6–24)
CO2: 21 mmol/L (ref 20–29)
Calcium: 9.6 mg/dL (ref 8.7–10.2)
Chloride: 104 mmol/L (ref 96–106)
Creatinine, Ser: 0.88 mg/dL (ref 0.57–1.00)
Glucose: 85 mg/dL (ref 70–99)
Potassium: 4.6 mmol/L (ref 3.5–5.2)
Sodium: 143 mmol/L (ref 134–144)
eGFR: 77 mL/min/{1.73_m2} (ref >59)

## 2022-11-07 LAB — LIPID PANEL
Chol/HDL Ratio: 3.7 {ratio} (ref 0.0–4.4)
Cholesterol, Total: 177 mg/dL (ref 100–199)
HDL: 48 mg/dL (ref >39)
LDL Chol Calc (NIH): 108 mg/dL — ABNORMAL HIGH (ref 0–99)
Triglycerides: 118 mg/dL (ref 0–149)
VLDL Cholesterol Cal: 21 mg/dL (ref 5–40)

## 2022-11-07 LAB — HEMOGLOBIN A1C
Est. average glucose Bld gHb Est-mCnc: 128 mg/dL
Hgb A1c MFr Bld: 6.1 % — ABNORMAL HIGH (ref 4.8–5.6)

## 2022-11-12 ENCOUNTER — Ambulatory Visit: Payer: Medicaid Other | Admitting: Nurse Practitioner

## 2022-11-19 DIAGNOSIS — Z2821 Immunization not carried out because of patient refusal: Secondary | ICD-10-CM | POA: Insufficient documentation

## 2022-11-19 DIAGNOSIS — E66813 Obesity, class 3: Secondary | ICD-10-CM | POA: Insufficient documentation

## 2022-11-19 NOTE — Assessment & Plan Note (Addendum)
Cholesterol levels had decreased slightly at last visit.  Should have said that the L2 she might respond.  Continue focusing on low-fat diet.  Increase fiber intake as well

## 2022-11-19 NOTE — Assessment & Plan Note (Signed)
Hemoglobin A1c was slightly increased at last visit.  She is currently focusing on her diet and exercise.  Will recheck levels today.

## 2022-11-19 NOTE — Assessment & Plan Note (Signed)
She is encouraged to strive for BMI less than 30 to decrease cardiac risk. Advised to aim for at least 150 minutes of exercise per week.

## 2022-11-19 NOTE — Assessment & Plan Note (Signed)
EKG done heart rate 70.  Sinus rhythm concerns that she may have pulmonary issues.  She will also go for a chest x-ray.  She is having a cough as well.

## 2022-11-19 NOTE — Assessment & Plan Note (Signed)
Blood pressure is well controlled. Continue current medications. 

## 2022-11-19 NOTE — Assessment & Plan Note (Signed)

## 2022-11-19 NOTE — Assessment & Plan Note (Signed)

## 2022-11-19 NOTE — Assessment & Plan Note (Signed)
Declines shingrix, educated on disease process and is aware if he changes his mind to notify office  

## 2022-11-19 NOTE — Assessment & Plan Note (Signed)
She is to use her albuterol inhaler

## 2022-12-25 ENCOUNTER — Other Ambulatory Visit: Payer: Self-pay | Admitting: Nurse Practitioner

## 2022-12-27 ENCOUNTER — Other Ambulatory Visit: Payer: Self-pay

## 2022-12-27 DIAGNOSIS — R072 Precordial pain: Secondary | ICD-10-CM

## 2022-12-27 MED ORDER — VALSARTAN 320 MG PO TABS: 320.0000 mg | ORAL_TABLET | Freq: Every day | ORAL | 0 refills | Status: DC

## 2023-03-25 ENCOUNTER — Other Ambulatory Visit: Payer: Self-pay | Admitting: Cardiology

## 2023-03-25 DIAGNOSIS — R072 Precordial pain: Secondary | ICD-10-CM

## 2023-03-26 ENCOUNTER — Other Ambulatory Visit: Payer: Self-pay

## 2023-03-26 MED ORDER — NEBIVOLOL HCL 20 MG PO TABS: 20.0000 mg | ORAL_TABLET | Freq: Every day | ORAL | 0 refills | Status: DC

## 2023-03-26 MED ORDER — SPIRONOLACTONE 25 MG PO TABS: 25.0000 mg | ORAL_TABLET | Freq: Every day | ORAL | 0 refills | Status: DC

## 2023-03-26 MED ORDER — DILTIAZEM HCL 30 MG PO TABS: 30.0000 mg | ORAL_TABLET | Freq: Two times a day (BID) | ORAL | 0 refills | Status: DC

## 2023-03-31 ENCOUNTER — Telehealth: Payer: Self-pay | Admitting: Cardiology

## 2023-03-31 DIAGNOSIS — R072 Precordial pain: Secondary | ICD-10-CM

## 2023-03-31 MED ORDER — SPIRONOLACTONE 25 MG PO TABS: 25.0000 mg | ORAL_TABLET | Freq: Every day | ORAL | 0 refills | Status: DC

## 2023-03-31 MED ORDER — NEBIVOLOL HCL 20 MG PO TABS: 20.0000 mg | ORAL_TABLET | Freq: Every day | ORAL | 0 refills | Status: DC

## 2023-03-31 MED ORDER — VALSARTAN 320 MG PO TABS: 320.0000 mg | ORAL_TABLET | Freq: Every day | ORAL | 0 refills | Status: DC

## 2023-03-31 MED ORDER — DILTIAZEM HCL 30 MG PO TABS: 30.0000 mg | ORAL_TABLET | Freq: Two times a day (BID) | ORAL | 0 refills | Status: DC

## 2023-03-31 NOTE — Telephone Encounter (Signed)
*  STAT* If patient is at the pharmacy, call can be transferred to refill team.   1. Which medications need to be refilled? (please list name of each medication and dose if known)   diltiazem (CARDIZEM) 30 MG tablet  Nebivolol HCl (BYSTOLIC) 20 MG TABS   spironolactone (ALDACTONE) 25 MG tablet  valsartan (DIOVAN) 320 MG tablet   2. Would you like to learn more about the convenience, safety, & potential cost savings by using the [facility]? No   3. Are you open to using the [facility] (Type [facility] No   4. Which pharmacy/location (including street and city if local pharmacy) is medication to be sent to? [id number] - [place], [place] - [address]    5. Do they need a 30 day or 90 day supply? 90 day   Pt has scheduled appt on 3/25 and is out of medications

## 2023-03-31 NOTE — Telephone Encounter (Signed)
Pt's medications sent to pt's pharmacy as requested. Confirmation received.  

## 2023-04-13 NOTE — Progress Notes (Unsigned)
 Cardiology Office Note    Patient Name: Sharon Bond Date of Encounter: 04/13/2023  Primary Care Provider:  Arnette Felts, FNP Primary Cardiologist:  None Primary Electrophysiologist: None   Past Medical History    Past Medical History:  Diagnosis Date   Allergy    pollen   Anxiety 2022   Eczema    GERD (gastroesophageal reflux disease)    Hypertension    IUD (intrauterine device) in place 2005   removed 2015   Mixed incontinence    Seasonal allergic rhinitis    Sleep apnea 10/2020   11/16/20--has not rec'd cpap yet   Wears glasses     History of Present Illness  Sharon Bond is a 58 y.o. female with a PMH of HTN, OSA, GERD, anxiety, obesity who presents today for overdue follow-up.  Sharon Bond was seen initially by Dr. Shari Prows by referral of PCP for complaint of DOE and chest pressure.  She underwent a coronary CTA that showed calcium score of 0 with no significant coronary disease.  She also completed 2D echo showing EF of 60 to 65% with no RWMA and mild concentric LVH with trivial MVR.  She was last seen by Dr. Shari Prows on 12/06/2021 for follow-up.  During visit she reported ongoing shortness of breath with palpitations.  She was referred to pulmonary for further evaluation of shortness of breath.  She wore a 3-day event monitor to evaluate palpitations that showed 2 runs of SVT that were nonsustained lasting 5 beats.  She was prescribed Bystolic to help with suppressing symptoms.  Sharon Bond presents today for follow-up and reports that she has been doing well with no new cardiac complaints.  She does report occasional episodes of palpitations that are very brief with increased physical activity as well as shortness of breath.  She was evaluated by pulmonology and is currently taking Ventolin which has improved her symptoms.She remains active, primarily walking, and experiences occasional shortness of breath during these activities, but not consistently enough to cause  concern. She has a history of elevated cholesterol, with a recent LDL level of 108 mg/dL, down from 841 mg/dL. She is not currently on medication for cholesterol and is managing it through dietary changes. Her triglycerides and HDL levels are within normal limits. She has a history of prediabetes with an A1c of 6.2%. She is working on reducing her sugar intake, particularly from coffee, and has switched to decaffeinated beverages. She does not consume alcohol or chocolate. She has a history of sleep apnea but does not use CPAP due to intolerance. She reports feeling less energetic, which she attributes to aging and a past vitamin D deficiency. She has received B12 shots and is trying to increase her sun exposure. She experiences leg swelling, which reduces with elevation at night. She is on her feet frequently and uses compression socks occasionally. Patient denies chest pain, palpitations, dyspnea, PND, orthopnea, nausea, vomiting, dizziness, syncope, edema, weight gain, or early satiety.  Discussed the use of AI scribe software for clinical note transcription with the patient, who gave verbal consent to proceed.  History of Present Illness   Review of Systems  Please see the history of present illness.    All other systems reviewed and are otherwise negative except as noted above.  Physical Exam    Wt Readings from Last 3 Encounters:  11/06/22 237 lb 12.8 oz (107.9 kg)  06/04/22 235 lb (106.6 kg)  05/13/22 235 lb (106.6 kg)   LK:GMWNU were no vitals  filed for this visit.,There is no height or weight on file to calculate BMI. GEN: Well nourished, well developed in no acute distress Neck: No JVD; No carotid bruits Pulmonary: Clear to auscultation without rales, wheezing or rhonchi  Cardiovascular: Normal rate. Regular rhythm. Normal S1. Normal S2.   Murmurs: There is no murmur.  ABDOMEN: Soft, non-tender, non-distended EXTREMITIES: Bilateral trace edema  EKG/LABS/ Recent Cardiac Studies    ECG personally reviewed by me today -none completed today  Risk Assessment/Calculations:          Lab Results  Component Value Date   WBC 5.5 05/13/2022   HGB 14.1 05/13/2022   HCT 42.9 05/13/2022   MCV 90 05/13/2022   PLT 190 05/13/2022   Lab Results  Component Value Date   CREATININE 0.88 11/06/2022   BUN 16 11/06/2022   NA 143 11/06/2022   K 4.6 11/06/2022   CL 104 11/06/2022   CO2 21 11/06/2022   Lab Results  Component Value Date   CHOL 177 11/06/2022   HDL 48 11/06/2022   LDLCALC 108 (H) 11/06/2022   TRIG 118 11/06/2022   CHOLHDL 3.7 11/06/2022    Lab Results  Component Value Date   HGBA1C 6.1 (H) 11/06/2022   Assessment & Plan    1.  Essential hypertension: -Patient's blood pressure today is well-controlled at 120/82 -Continue spironolactone 25 mg daily, nebivolol 20 mg daily, Cardizem 30 mg twice daily and valsartan 320 mg daily  2.  Palpitations: -Patient's event monitor showed no sustained arrhythmia and was predominantly sinus rhythm with 2 episodes of brief ST -She reports brief episodes of palpitations that occur with increased physical activity -Continue nebivolol 20 mg daily and as needed 10 mg for increased palpitations.  3.  Dyspnea on exertion: -Improved with Ventolin. No cardiac cause; referred to pulmonology. - Continue Ventolin inhaler as needed.  4.  Obesity: -Patient's BMI is 42.95 kg/m -Patient is active with walking and encouraged to increase physical activity 150 minutes/week. -Patient also encouraged to follow a low-sodium heart healthy diet.  5. OSA: -Patient reports intolerance with CPAP and notes no daytime somnolence or interrupted sleep pattern.  6. Venous stasis: -Leg swelling due to venous stasis and prolonged standing with work, resolves with elevation. - Recommend compression socks elevation when sitting.     Disposition: Follow-up with [facility] or APP in 6 months    Signed, Napoleon Form, Leodis Rains, NP 04/13/2023,  5:35 PM [facility]

## 2023-04-15 ENCOUNTER — Ambulatory Visit: Attending: Nurse Practitioner | Admitting: Nurse Practitioner

## 2023-04-15 ENCOUNTER — Encounter: Payer: Self-pay | Admitting: Nurse Practitioner

## 2023-04-15 VITALS — BP 120/82 | HR 70 | Ht 62.0 in | Wt 234.8 lb

## 2023-04-15 DIAGNOSIS — I10 Essential (primary) hypertension: Secondary | ICD-10-CM | POA: Diagnosis not present

## 2023-04-15 DIAGNOSIS — R0609 Other forms of dyspnea: Secondary | ICD-10-CM

## 2023-04-15 DIAGNOSIS — R072 Precordial pain: Secondary | ICD-10-CM

## 2023-04-15 DIAGNOSIS — R002 Palpitations: Secondary | ICD-10-CM

## 2023-04-15 MED ORDER — SPIRONOLACTONE 25 MG PO TABS: 25.0000 mg | ORAL_TABLET | Freq: Every day | ORAL | 3 refills | Status: AC

## 2023-04-15 MED ORDER — VALSARTAN 320 MG PO TABS: 320.0000 mg | ORAL_TABLET | Freq: Every day | ORAL | 3 refills | Status: AC

## 2023-04-15 MED ORDER — DILTIAZEM HCL 30 MG PO TABS: 30.0000 mg | ORAL_TABLET | Freq: Two times a day (BID) | ORAL | 3 refills | Status: AC

## 2023-04-15 MED ORDER — NEBIVOLOL HCL 20 MG PO TABS: 20.0000 mg | ORAL_TABLET | Freq: Every day | ORAL | 2 refills | Status: DC

## 2023-04-15 NOTE — Patient Instructions (Signed)
 Medication Instructions:  Can take an additional half tab of Nebivolol 5mg  as needed for palpitations  *If you need a refill on your cardiac medications before your next appointment, please call your pharmacy*   Lab Work: None ordered If you have labs (blood work) drawn today and your tests are completely normal, you will receive your results only by: MyChart Message (if you have MyChart) OR A paper copy in the mail If you have any lab test that is abnormal or we need to change your treatment, we will call you to review the results.   Testing/Procedures: None ordered   Follow-Up: At [facility], you and your health needs are our priority.  As part of our continuing mission to provide you with exceptional heart care, we have created designated Provider Care Teams.  These Care Teams include your primary Cardiologist (physician) and Advanced Practice Providers (APPs -  Physician Assistants and Nurse Practitioners) who all work together to provide you with the care you need, when you need it.  We recommend signing up for the patient portal called "MyChart".  Sign up information is provided on this After Visit Summary.  MyChart is used to connect with patients for Virtual Visits (Telemedicine).  Patients are able to view lab/test results, encounter notes, upcoming appointments, etc.  Non-urgent messages can be sent to your provider as well.   To learn more about what you can do with MyChart, go to [email or web address].    Your next appointment:   6 month(s)  Provider:   Thomasene Ripple, MD   Other Instructions    1st Floor: - Lobby - Registration  - Pharmacy  - Lab - Cafe  2nd Floor: - PV Lab - Diagnostic Testing (echo, CT, nuclear med)  3rd Floor: - Vacant  4th Floor: - TCTS (cardiothoracic surgery) - AFib Clinic - Structural Heart Clinic - Vascular Surgery  - Vascular Ultrasound  5th Floor: - HeartCare Cardiology (general and EP) - Clinical Pharmacy for  coumadin, hypertension, lipid, weight-loss medications, and med management appointments    Valet parking services will be available as well.

## 2023-04-24 ENCOUNTER — Encounter: Payer: Self-pay | Admitting: Cardiology

## 2023-05-14 ENCOUNTER — Ambulatory Visit: Payer: Medicaid Other | Admitting: Nurse Practitioner

## 2023-05-14 ENCOUNTER — Encounter: Payer: Self-pay | Admitting: Nurse Practitioner

## 2023-05-14 VITALS — BP 120/80 | HR 76 | Temp 98.3°F | Ht 62.0 in | Wt 240.8 lb

## 2023-05-14 DIAGNOSIS — R7309 Other abnormal glucose: Secondary | ICD-10-CM | POA: Diagnosis not present

## 2023-05-14 DIAGNOSIS — Z23 Encounter for immunization: Secondary | ICD-10-CM

## 2023-05-14 DIAGNOSIS — E7800 Pure hypercholesterolemia, unspecified: Secondary | ICD-10-CM

## 2023-05-14 DIAGNOSIS — I10 Essential (primary) hypertension: Secondary | ICD-10-CM

## 2023-05-14 DIAGNOSIS — Z0000 Encounter for general adult medical examination without abnormal findings: Secondary | ICD-10-CM

## 2023-05-14 DIAGNOSIS — Z79899 Other long term (current) drug therapy: Secondary | ICD-10-CM

## 2023-05-14 DIAGNOSIS — R21 Rash and other nonspecific skin eruption: Secondary | ICD-10-CM

## 2023-05-14 DIAGNOSIS — R82998 Other abnormal findings in urine: Secondary | ICD-10-CM

## 2023-05-14 DIAGNOSIS — E66813 Obesity, class 3: Secondary | ICD-10-CM

## 2023-05-14 DIAGNOSIS — J302 Other seasonal allergic rhinitis: Secondary | ICD-10-CM

## 2023-05-14 DIAGNOSIS — Z1231 Encounter for screening mammogram for malignant neoplasm of breast: Secondary | ICD-10-CM

## 2023-05-14 DIAGNOSIS — L659 Nonscarring hair loss, unspecified: Secondary | ICD-10-CM

## 2023-05-14 DIAGNOSIS — Z6841 Body Mass Index (BMI) 40.0 and over, adult: Secondary | ICD-10-CM

## 2023-05-14 LAB — POCT URINALYSIS DIP (CLINITEK)
Bilirubin, UA: NEGATIVE
Blood, UA: NEGATIVE
Glucose, UA: NEGATIVE mg/dL
Ketones, POC UA: NEGATIVE mg/dL
Nitrite, UA: NEGATIVE
POC PROTEIN,UA: NEGATIVE
Spec Grav, UA: 1.025 (ref 1.010–1.025)
Urobilinogen, UA: 0.2 U/dL
pH, UA: 6 (ref 5.0–8.0)

## 2023-05-14 MED ORDER — MELOXICAM 15 MG PO TABS: 15.0000 mg | ORAL_TABLET | Freq: Every day | ORAL | 1 refills | Status: DC

## 2023-05-14 MED ORDER — TRIAMCINOLONE ACETONIDE 0.1 % EX CREA
1.0000 | TOPICAL_CREAM | CUTANEOUS | 5 refills | Status: AC | PRN
Start: 2023-05-14 — End: ?

## 2023-05-14 MED ORDER — AZELASTINE HCL 0.1 % NA SOLN: 2.0000 | Freq: Two times a day (BID) | NASAL | 2 refills | Status: AC

## 2023-05-14 NOTE — Progress Notes (Signed)
 [place], CMA,acting as a Neurosurgeon for Susanna Epley, FNP.,have documented all relevant documentation on the behalf of Susanna Epley, FNP,as directed by  Susanna Epley, FNP while in the presence of Susanna Epley, FNP.  Subjective:    Patient ID: Sharon Bond , female    DOB: Nov 24, 1965 , 58 y.o.   MRN: [id number]  Chief Complaint  Patient presents with   Annual Exam    HPI  Patient presents today for HM, Patient reports compliance with medication. Patient denies any chest pain, SOB, or headaches. Patient has no concerns today.   Patient has seen Cardiology last month, no medication changes, return in 6 months. She has had no palpitations over the past month no SOB.   Seasonal allergies have been difficult and she needs something to help during the daytime.    She has not visit the dentist in the past few years.  We discussed connected with a dentist for regular visits for her cardiac health, she is not willing at this time.   She spot checks BP at home weekly, readings have been 120/80 on average.   Pt has experienced some reflux lately but feels related to diet changes.  She has increased her veggie intake, less beef, and she working on giving up rice.  She has stopped regular coffee and only drinks decaf.  She is drinking 40-50 oz of water per day.   She is married, one sexual partner, no concerns.    She continues with increasing physical activity, she is waling once a week for 3 miles.   Skin dry skin irritation persist, prescribed medicated cream is effective. Endorses hair loss at the crown of her head that has became increasingly worse.  Endorses aches and pains that improve with movement, prescribed NSAID is effective.       Past Medical History:  Diagnosis Date   Allergy    pollen   Anxiety 2022   Eczema    GERD (gastroesophageal reflux disease)    Hypertension    IUD (intrauterine device) in place 2005   removed 2015   Mixed incontinence    Seasonal allergic  rhinitis    Sleep apnea 10/2020   11/16/20--has not rec'd cpap yet   Wears glasses      Family History  Problem Relation Age of Onset   Diabetes Mother    Hypertension Mother    Alzheimer's disease Mother    Healthy Father    Diabetes Brother    Heart disease Maternal Aunt    Healthy Son    Healthy Son    Hypertension Daughter    Healthy Daughter    Cancer Neg Hx    Stroke Neg Hx    Sleep apnea Neg Hx    Colon cancer Neg Hx    Colon polyps Neg Hx    Esophageal cancer Neg Hx    Rectal cancer Neg Hx    Stomach cancer Neg Hx      Current Outpatient Medications:    albuterol  (VENTOLIN  HFA) 108 (90 Base) MCG/ACT inhaler, Inhale 2 puffs into the lungs every 6 (six) hours as needed for wheezing or shortness of breath., Disp: 18 g, Rfl: 2   azelastine  (ASTELIN ) 0.1 % nasal spray, Place 2 sprays into both nostrils 2 (two) times daily. Use in each nostril as directed, Disp: 30 mL, Rfl: 2   cetirizine  (ZYRTEC  ALLERGY) 10 MG tablet, Take 1 tablet (10 mg total) by mouth at bedtime., Disp: 90 tablet, Rfl: 1  diltiazem  (CARDIZEM ) 30 MG tablet, Take 1 tablet (30 mg total) by mouth in the morning and at bedtime., Disp: 180 tablet, Rfl: 3   EPINEPHrine  0.3 mg/0.3 mL IJ SOAJ injection, Inject 0.3 mg into the muscle as needed for anaphylaxis., Disp: 1 each, Rfl: 1   fluticasone  (FLONASE ) 50 MCG/ACT nasal spray, Place 1 spray into both nostrils daily. (Patient taking differently: Place 1 spray into both nostrils as needed.), Disp: 47.4 mL, Rfl: 1   Nebivolol  HCl (BYSTOLIC ) 20 MG TABS, Take 1 tablet (20 mg total) by mouth daily. Can take an additional half tab as needed for palpitations, Disp: 45 tablet, Rfl: 2   spironolactone  (ALDACTONE ) 25 MG tablet, Take 1 tablet (25 mg total) by mouth daily., Disp: 90 tablet, Rfl: 3   valsartan  (DIOVAN ) 320 MG tablet, Take 1 tablet (320 mg total) by mouth daily., Disp: 90 tablet, Rfl: 3   meloxicam  (MOBIC ) 15 MG tablet, Take 1 tablet (15 mg total) by mouth  daily., Disp: 30 tablet, Rfl: 1   triamcinolone  cream (KENALOG ) 0.1 %, Apply 1 Application topically as needed. Pt using at least 4 times a day, Disp: 30 g, Rfl: 5   Allergies  Allergen Reactions   Olmesartan      "cough"   Amlodipine Cough   Penicillins Rash and Other (See Comments)    Hives, rash. No airway symptoms      The patient's tobacco use is:  Social History   Tobacco Use  Smoking Status Never   Passive exposure: Past  Smokeless Tobacco Never  . She has been exposed to passive smoke. The patient's alcohol use is:  Social History   Substance and Sexual Activity  Alcohol Use Yes   Comment: occasionally during holidays, wine or beer   Review of Systems  Constitutional:  Negative for chills, fatigue and fever.  HENT:  Positive for congestion and sneezing.   Cardiovascular:  Positive for leg swelling (bilateral lower legs).  Gastrointestinal:  Negative for abdominal pain, constipation and diarrhea.  Musculoskeletal:  Positive for arthralgias (generalized).  Allergic/Immunologic: Positive for environmental allergies.  Neurological:  Negative for weakness and headaches.     Today's Vitals   05/14/23 0849  BP: 120/80  Pulse: 76  Temp: 98.3 F (36.8 C)  TempSrc: Oral  Weight: 240 lb 12.8 oz (109.2 kg)  Height: 5\' 2"  (1.575 m)  PainSc: 0-No pain   Body mass index is 44.04 kg/m.  Wt Readings from Last 3 Encounters:  05/14/23 240 lb 12.8 oz (109.2 kg)  04/15/23 234 lb 12.8 oz (106.5 kg)  11/06/22 237 lb 12.8 oz (107.9 kg)     Objective:  Physical Exam Exam conducted with a chaperone present.  Constitutional:      Appearance: Normal appearance. She is obese.  HENT:     Head: Normocephalic and atraumatic.     Right Ear: Tympanic membrane, ear canal and external ear normal.     Left Ear: Tympanic membrane, ear canal and external ear normal.     Nose: Congestion (swollen nasal turbinates) present.  Eyes:     Extraocular Movements: Extraocular movements  intact.     Conjunctiva/sclera: Conjunctivae normal.     Pupils: Pupils are equal, round, and reactive to light.  Cardiovascular:     Rate and Rhythm: Normal rate and regular rhythm.     Pulses: Normal pulses.  Pulmonary:     Effort: Pulmonary effort is normal.     Breath sounds: Normal breath sounds.  Chest:  Breasts:  Tanner Score is 5.     Right: Normal. No mass, nipple discharge or tenderness.     Left: Normal. No mass, nipple discharge or tenderness.     Comments: Breast exam performed Abdominal:     General: Bowel sounds are normal. There is no distension.     Palpations: There is no mass.     Tenderness: There is no abdominal tenderness.  Musculoskeletal:     Right lower leg: Edema present.     Left lower leg: Edema present.  Skin:    Capillary Refill: Capillary refill takes less than 2 seconds.     Findings: No lesion or rash.  Neurological:     General: No focal deficit present.     Mental Status: She is alert and oriented to person, place, and time. Mental status is at baseline.     Motor: No weakness.  Psychiatric:        Mood and Affect: Mood normal.        Behavior: Behavior normal.        Thought Content: Thought content normal.         Assessment And Plan:     Encounter for annual health examination Assessment & Plan: Behavior modifications discussed and diet history reviewed.   Pt will continue to exercise regularly and modify diet with low GI, plant based foods and decrease intake of processed foods.  Recommend intake of daily multivitamin, Vitamin D , and calcium.  Recommend mammogram and colonoscopy for preventive screenings, as well as recommend immunizations that include influenza, TDAP   Orders: -     3D Screening Mammogram, Left and Right; Future  Encounter for screening mammogram for breast cancer Assessment & Plan: Pt instructed on Self Breast Exam.According to ACOG guidelines Women aged 81 and older are recommended to get an annual  mammogram.     Class 3 severe obesity due to excess calories with serious comorbidity and body mass index (BMI) of 40.0 to 44.9 in adult Assessment & Plan: She is encouraged to strive for BMI less than 30 to decrease cardiac risk. Advised to aim for at least 150 minutes of exercise per week.    COVID-19 vaccine administered Assessment & Plan: Covid 19 vaccine given in office observed for 15 minutes without any adverse reaction   Orders: -     Pfizer Comirnaty Covid-19 Vaccine 59yrs & older  Essential hypertension Assessment & Plan: Blood pressure is well-controlled.  Continue current medications.  Orders: -     EKG 12-Lead -     POCT URINALYSIS DIP (CLINITEK) -     Microalbumin / creatinine urine ratio -     CMP14+EGFR  Abnormal glucose Assessment & Plan: Hemoglobin A1c stable.  She is currently focusing on her diet and exercise.  Will recheck levels today.  Orders: -     Hemoglobin A1c  Elevated cholesterol Assessment & Plan: Cholesterol levels are stable. Continue focusing on low-fat diet.  Increase fiber intake as well  Orders: -     Lipid panel  Rash and nonspecific skin eruption -     Triamcinolone  Acetonide; Apply 1 Application topically as needed. Pt using at least 4 times a day  Dispense: 30 g; Refill: 5  Seasonal allergies Assessment & Plan: Will treat with kenalog  injection.   Orders: -     Azelastine  HCl; Place 2 sprays into both nostrils 2 (two) times daily. Use in each nostril as directed  Dispense: 30 mL; Refill: 2  Hair loss Assessment &  Plan: Will check for metabolic causes  Orders: -     VITAMIN D  25 Hydroxy (Vit-D Deficiency, Fractures) -     Vitamin B12 -     TSH  Leukocytes in urine Assessment & Plan: Will send urine culture  Orders: -     Urine Culture  Other long term (current) drug therapy -     CBC with Differential/Platelet  Other orders -     Meloxicam ; Take 1 tablet (15 mg total) by mouth daily.  Dispense: 30 tablet;  Refill: 1   Return for 1 year physical, 6 month bp check. Patient was given opportunity to ask questions. Patient verbalized understanding of the plan and was able to repeat key elements of the plan. All questions were answered to their satisfaction.   I have reviewed this encounter including the documentation in this note and/or discussed this patient with Mickael Alamo FNP Student. I am certifying that I agree with the content of this note as the primary care nurse practitioner.  Susanna Epley, DNP, FNP-BC  Susanna Epley, FNP  I, Susanna Epley, FNP, have reviewed all documentation for this visit. The documentation on 05/14/23 for the exam, diagnosis, procedures, and orders are all accurate and complete.

## 2023-05-15 LAB — URINE CULTURE

## 2023-05-17 LAB — CMP14+EGFR
ALT: 15 IU/L (ref 0–32)
AST: 22 IU/L (ref 0–40)
Albumin: 4.1 g/dL (ref 3.8–4.9)
Alkaline Phosphatase: 118 IU/L (ref 44–121)
BUN/Creatinine Ratio: 13 (ref 9–23)
BUN: 9 mg/dL (ref 6–24)
Bilirubin Total: 0.3 mg/dL (ref 0.0–1.2)
CO2: 20 mmol/L (ref 20–29)
Calcium: 9.3 mg/dL (ref 8.7–10.2)
Chloride: 104 mmol/L (ref 96–106)
Creatinine, Ser: 0.71 mg/dL (ref 0.57–1.00)
Globulin, Total: 2.5 g/dL (ref 1.5–4.5)
Glucose: 91 mg/dL (ref 70–99)
Potassium: 4.7 mmol/L (ref 3.5–5.2)
Sodium: 142 mmol/L (ref 134–144)
Total Protein: 6.6 g/dL (ref 6.0–8.5)
eGFR: 100 mL/min/{1.73_m2} (ref >59)

## 2023-05-17 LAB — CBC WITH DIFFERENTIAL/PLATELET
Basophils Absolute: 0 10*3/uL (ref 0.0–0.2)
Basos: 1 %
EOS (ABSOLUTE): 0.2 10*3/uL (ref 0.0–0.4)
Eos: 4 %
Hematocrit: 44.2 % (ref 34.0–46.6)
Hemoglobin: 14 g/dL (ref 11.1–15.9)
Immature Grans (Abs): 0 10*3/uL (ref 0.0–0.1)
Immature Granulocytes: 0 %
Lymphocytes Absolute: 2.4 10*3/uL (ref 0.7–3.1)
Lymphs: 41 %
MCH: 28.6 pg (ref 26.6–33.0)
MCHC: 31.7 g/dL (ref 31.5–35.7)
MCV: 90 fL (ref 79–97)
Monocytes Absolute: 0.4 10*3/uL (ref 0.1–0.9)
Monocytes: 6 %
Neutrophils Absolute: 2.8 10*3/uL (ref 1.4–7.0)
Neutrophils: 48 %
Platelets: 173 10*3/uL (ref 150–450)
RBC: 4.89 x10E6/uL (ref 3.77–5.28)
RDW: 12.6 % (ref 11.7–15.4)
WBC: 5.8 10*3/uL (ref 3.4–10.8)

## 2023-05-17 LAB — MICROALBUMIN / CREATININE URINE RATIO
Creatinine, Urine: 162.3 mg/dL
Microalb/Creat Ratio: 4 mg/g{creat} (ref 0–29)
Microalbumin, Urine: 5.9 ug/mL

## 2023-05-17 LAB — VITAMIN D 25 HYDROXY (VIT D DEFICIENCY, FRACTURES): Vit D, 25-Hydroxy: 23.2 ng/mL — ABNORMAL LOW (ref 30.0–100.0)

## 2023-05-17 LAB — LIPID PANEL
Chol/HDL Ratio: 3.5 ratio (ref 0.0–4.4)
Cholesterol, Total: 181 mg/dL (ref 100–199)
HDL: 51 mg/dL (ref >39)
LDL Chol Calc (NIH): 111 mg/dL — ABNORMAL HIGH (ref 0–99)
Triglycerides: 108 mg/dL (ref 0–149)
VLDL Cholesterol Cal: 19 mg/dL (ref 5–40)

## 2023-05-17 LAB — HEMOGLOBIN A1C
Est. average glucose Bld gHb Est-mCnc: 126 mg/dL
Hgb A1c MFr Bld: 6 % — ABNORMAL HIGH (ref 4.8–5.6)

## 2023-05-17 LAB — TSH: TSH: 1.29 u[IU]/mL (ref 0.450–4.500)

## 2023-05-17 LAB — VITAMIN B12: Vitamin B-12: 222 pg/mL — ABNORMAL LOW (ref 232–1245)

## 2023-05-27 ENCOUNTER — Encounter: Payer: Self-pay | Admitting: Nurse Practitioner

## 2023-05-27 DIAGNOSIS — Z1231 Encounter for screening mammogram for malignant neoplasm of breast: Secondary | ICD-10-CM | POA: Insufficient documentation

## 2023-05-27 DIAGNOSIS — J302 Other seasonal allergic rhinitis: Secondary | ICD-10-CM | POA: Insufficient documentation

## 2023-05-27 DIAGNOSIS — Z23 Encounter for immunization: Secondary | ICD-10-CM | POA: Insufficient documentation

## 2023-05-27 DIAGNOSIS — L659 Nonscarring hair loss, unspecified: Secondary | ICD-10-CM | POA: Insufficient documentation

## 2023-05-27 DIAGNOSIS — R21 Rash and other nonspecific skin eruption: Secondary | ICD-10-CM | POA: Insufficient documentation

## 2023-05-27 DIAGNOSIS — Z0000 Encounter for general adult medical examination without abnormal findings: Secondary | ICD-10-CM | POA: Insufficient documentation

## 2023-05-27 DIAGNOSIS — R82998 Other abnormal findings in urine: Secondary | ICD-10-CM | POA: Insufficient documentation

## 2023-05-27 NOTE — Assessment & Plan Note (Signed)
 Cholesterol levels are stable. Continue focusing on low-fat diet.  Increase fiber intake as well

## 2023-05-27 NOTE — Assessment & Plan Note (Signed)
 Blood pressure is well controlled. Continue current medications.

## 2023-05-27 NOTE — Assessment & Plan Note (Signed)

## 2023-05-27 NOTE — Assessment & Plan Note (Signed)
 Will treat with kenalog injection

## 2023-05-27 NOTE — Assessment & Plan Note (Signed)
Will send urine culture

## 2023-05-27 NOTE — Assessment & Plan Note (Signed)
 She is encouraged to strive for BMI less than 30 to decrease cardiac risk. Advised to aim for at least 150 minutes of exercise per week.

## 2023-05-27 NOTE — Assessment & Plan Note (Signed)
Will check for metabolic causes.

## 2023-05-27 NOTE — Assessment & Plan Note (Signed)
 Hemoglobin A1c stable.  She is currently focusing on her diet and exercise.  Will recheck levels today.

## 2023-05-27 NOTE — Assessment & Plan Note (Signed)
 Covid 19 vaccine given in office observed for 15 minutes without any adverse reaction

## 2023-05-27 NOTE — Assessment & Plan Note (Signed)
Pt instructed on Self Breast Exam.According to ACOG guidelines Women aged 57 and older are recommended to get an annual mammogram.   

## 2023-06-13 ENCOUNTER — Ambulatory Visit
Admission: RE | Admit: 2023-06-13 | Discharge: 2023-06-13 | Disposition: A | Discharge status: Home / Self Care | Source: Ambulatory Visit | Attending: Nurse Practitioner | Admitting: Nurse Practitioner

## 2023-06-13 DIAGNOSIS — Z0000 Encounter for general adult medical examination without abnormal findings: Secondary | ICD-10-CM

## 2023-07-16 ENCOUNTER — Ambulatory Visit: Payer: Self-pay | Admitting: Nurse Practitioner

## 2023-07-16 VITALS — BP 116/82 | HR 76 | Temp 98.6°F | Wt 236.0 lb

## 2023-07-16 DIAGNOSIS — E559 Vitamin D deficiency, unspecified: Secondary | ICD-10-CM | POA: Diagnosis not present

## 2023-07-16 DIAGNOSIS — E66813 Obesity, class 3: Secondary | ICD-10-CM | POA: Diagnosis not present

## 2023-07-16 DIAGNOSIS — Z6841 Body Mass Index (BMI) 40.0 and over, adult: Secondary | ICD-10-CM

## 2023-07-16 DIAGNOSIS — E538 Deficiency of other specified B group vitamins: Secondary | ICD-10-CM

## 2023-07-16 DIAGNOSIS — M79605 Pain in left leg: Secondary | ICD-10-CM | POA: Diagnosis not present

## 2023-07-16 DIAGNOSIS — M62838 Other muscle spasm: Secondary | ICD-10-CM

## 2023-07-16 DIAGNOSIS — Z2821 Immunization not carried out because of patient refusal: Secondary | ICD-10-CM

## 2023-07-16 MED ORDER — VITAMIN D (ERGOCALCIFEROL) 1.25 MG (50000 UNIT) PO CAPS: 50000.0000 [IU] | ORAL_CAPSULE | ORAL | 1 refills | Status: DC

## 2023-07-16 MED ORDER — CYANOCOBALAMIN 1000 MCG/ML IJ SOLN
1000.0000 ug | Freq: Once | INTRAMUSCULAR | Status: AC
  Administered 2023-07-16: 1000 ug via INTRAMUSCULAR

## 2023-07-16 MED ORDER — WEGOVY 0.5 MG/0.5ML ~~LOC~~ SOAJ: 0.5000 mg | SUBCUTANEOUS | 0 refills | Status: DC

## 2023-07-16 MED ORDER — CYCLOBENZAPRINE HCL 10 MG PO TABS: 10.0000 mg | ORAL_TABLET | Freq: Three times a day (TID) | ORAL | 0 refills | Status: DC | PRN

## 2023-07-16 NOTE — Patient Instructions (Signed)
   If you have any stomach pain or difficulty swallowing call to office  [facility] may cause nausea allow time for this to improve  Goal to lose 1-2 lbs per week  Increase your physical activity and incorporate 2 days of strength training.

## 2023-07-16 NOTE — Progress Notes (Signed)
 I,Sharon Bond, CMA,acting as a Neurosurgeon for [employer], Sharon Bond.,have documented all relevant documentation on the behalf of Sharon Ada, Sharon Bond,as directed by  Sharon Ada, Sharon Bond while in the presence of Sharon Ada, Sharon Bond.  Subjective:  Patient ID: Sharon Bond , female    DOB: Jan 17, 1966 , 58 y.o.   MRN: [id number]  Chief Complaint  Patient presents with   Leg Pain    Patient presents today pain in her leg. Patient feels like she pulled muscle while lifting a box.     HPI  Here due to left leg pain, she was lifting paint and turned around wrong. She has been using biofreeze and epsom salt. Feels like a spasm.      Past Medical History:  Diagnosis Date   Allergy    pollen   Anxiety 2022   Eczema    GERD (gastroesophageal reflux disease)    Hypertension    IUD (intrauterine device) in place 2005   removed 2015   Mixed incontinence    Seasonal allergic rhinitis    Sleep apnea 10/2020   11/16/20--has not rec'd cpap yet   Wears glasses      Family History  Problem Relation Age of Onset   Diabetes Mother    Hypertension Mother    Alzheimer's disease Mother    Healthy Father    Diabetes Brother    Heart disease Maternal Aunt    Healthy Son    Healthy Son    Hypertension Daughter    Healthy Daughter    Cancer Neg Hx    Stroke Neg Hx    Sleep apnea Neg Hx    Colon cancer Neg Hx    Colon polyps Neg Hx    Esophageal cancer Neg Hx    Rectal cancer Neg Hx    Stomach cancer Neg Hx      Current Outpatient Medications:    albuterol  (VENTOLIN  HFA) 108 (90 Base) MCG/ACT inhaler, Inhale 2 puffs into the lungs every 6 (six) hours as needed for wheezing or shortness of breath., Disp: 18 g, Rfl: 2   azelastine  (ASTELIN ) 0.1 % nasal spray, Place 2 sprays into both nostrils 2 (two) times daily. Use in each nostril as directed, Disp: 30 mL, Rfl: 2   cetirizine  (ZYRTEC  ALLERGY) 10 MG tablet, Take 1 tablet (10 mg total) by mouth at bedtime., Disp: 90 tablet, Rfl: 1    cyclobenzaprine  (FLEXERIL ) 10 MG tablet, Take 1 tablet (10 mg total) by mouth 3 (three) times daily as needed., Disp: 30 tablet, Rfl: 0   diltiazem  (CARDIZEM ) 30 MG tablet, Take 1 tablet (30 mg total) by mouth in the morning and at bedtime., Disp: 180 tablet, Rfl: 3   EPINEPHrine  0.3 mg/0.3 mL IJ SOAJ injection, Inject 0.3 mg into the muscle as needed for anaphylaxis., Disp: 1 each, Rfl: 1   fluticasone  (FLONASE ) 50 MCG/ACT nasal spray, Place 1 spray into both nostrils daily. (Patient taking differently: Place 1 spray into both nostrils as needed.), Disp: 47.4 mL, Rfl: 1   meloxicam  (MOBIC ) 15 MG tablet, Take 1 tablet (15 mg total) by mouth daily., Disp: 30 tablet, Rfl: 1   Nebivolol  HCl (BYSTOLIC ) 20 MG TABS, Take 1 tablet (20 mg total) by mouth daily. Can take an additional half tab as needed for palpitations, Disp: 45 tablet, Rfl: 2   Semaglutide -Weight Management (WEGOVY ) 0.5 MG/0.5ML SOAJ, Inject 0.5 mg into the skin once a week., Disp: 2 mL, Rfl: 0   spironolactone  (ALDACTONE ) 25 MG tablet,  Take 1 tablet (25 mg total) by mouth daily., Disp: 90 tablet, Rfl: 3   triamcinolone  cream (KENALOG ) 0.1 %, Apply 1 Application topically as needed. Pt using at least 4 times a day, Disp: 30 g, Rfl: 5   valsartan  (DIOVAN ) 320 MG tablet, Take 1 tablet (320 mg total) by mouth daily., Disp: 90 tablet, Rfl: 3   Vitamin D , Ergocalciferol , (DRISDOL ) 1.25 MG (50000 UNIT) CAPS capsule, Take 1 capsule (50,000 Units total) by mouth every 7 (seven) days., Disp: 12 capsule, Rfl: 1   Allergies  Allergen Reactions   Olmesartan      cough   Amlodipine Cough   Penicillins Rash and Other (See Comments)    Hives, rash. No airway symptoms      Review of Systems  Constitutional: Negative.   Respiratory: Negative.    Gastrointestinal: Negative.   Musculoskeletal:        Left left pain  Neurological:  Negative for dizziness and headaches.  Psychiatric/Behavioral: Negative.       Today's Vitals   07/16/23 1532   BP: 116/82  Pulse: 76  Temp: 98.6 F (37 C)  TempSrc: Oral  Weight: 236 lb (107 kg)  PainSc: 5   PainLoc: Leg   Body mass index is 43.16 kg/m.  Wt Readings from Last 3 Encounters:  07/22/23 238 lb (108 kg)  07/16/23 236 lb (107 kg)  05/14/23 240 lb 12.8 oz (109.2 kg)      Objective:  Physical Exam Vitals reviewed.  Constitutional:      General: She is not in acute distress.    Appearance: Normal appearance. She is obese.  Cardiovascular:     Rate and Rhythm: Normal rate and regular rhythm.     Pulses: Normal pulses.     Heart sounds: Normal heart sounds. No murmur heard. Pulmonary:     Effort: Pulmonary effort is normal. No respiratory distress.     Breath sounds: Normal breath sounds. No wheezing.  Musculoskeletal:        General: Tenderness (left leg) present. No swelling or deformity. Normal range of motion.  Skin:    General: Skin is warm and dry.     Capillary Refill: Capillary refill takes less than 2 seconds.  Neurological:     General: No focal deficit present.     Mental Status: She is alert and oriented to person, place, and time.     Cranial Nerves: No cranial nerve deficit.     Motor: No weakness.  Psychiatric:        Mood and Affect: Mood normal.        Behavior: Behavior normal.        Thought Content: Thought content normal.        Judgment: Judgment normal.         Assessment And Plan:  Herpes zoster vaccination declined Assessment & Plan: Declines shingrix, educated on disease process and is aware if he changes his mind to notify office    Pain of left lower extremity Assessment & Plan: She has muscle tension, will treat with muscle relaxer. She is also encouraged to increase her water intake  Orders: -     Cyclobenzaprine  HCl; Take 1 tablet (10 mg total) by mouth 3 (three) times daily as needed.  Dispense: 30 tablet; Refill: 0  Class 3 severe obesity due to excess calories with serious comorbidity and body mass index (BMI) of 40.0 to  44.9 in adult Assessment & Plan: She is encouraged to strive for BMI less  than 30 to decrease cardiac risk. Advised to aim for at least 150 minutes of exercise per week. Discussed wegovy  side effects. Will sed if she can get approved. Denies family history of medullary thyroid cancer or personal history of pancreatitis.    Orders: -     Wegovy ; Inject 0.5 mg into the skin once a week.  Dispense: 2 mL; Refill: 0  Muscle spasm -     Cyclobenzaprine  HCl; Take 1 tablet (10 mg total) by mouth 3 (three) times daily as needed.  Dispense: 30 tablet; Refill: 0  Vitamin D  deficiency Assessment & Plan: Will refill vitamin d  supplement  Orders: -     Vitamin D  (Ergocalciferol ); Take 1 capsule (50,000 Units total) by mouth every 7 (seven) days.  Dispense: 12 capsule; Refill: 1  Vitamin B12 deficiency Assessment & Plan: She has had a low vitamin B12 will administer IM dose today and can come weekly x 4 weeks.   Orders: -     Cyanocobalamin     Return if symptoms worsen or fail to improve, for [place] Vitamin B12 injection weekly x 3 weeks then recheck vitamin B12.  Patient was given opportunity to ask questions. Patient verbalized understanding of the plan and was able to repeat key elements of the plan. All questions were answered to their satisfaction.    Sharon Sharon Ada, Sharon Bond, have reviewed all documentation for this visit. The documentation on 07/16/23 for the exam, diagnosis, procedures, and orders are all accurate and complete.   IF YOU HAVE BEEN REFERRED TO A SPECIALIST, IT MAY TAKE 1-2 WEEKS TO SCHEDULE/PROCESS THE REFERRAL. IF YOU HAVE NOT HEARD FROM US /SPECIALIST IN TWO WEEKS, PLEASE GIVE US  A CALL AT [phone].

## 2023-07-22 ENCOUNTER — Ambulatory Visit (INDEPENDENT_AMBULATORY_CARE_PROVIDER_SITE_OTHER): Payer: Self-pay

## 2023-07-22 VITALS — BP 122/78 | HR 67 | Temp 98.5°F | Ht 62.0 in | Wt 238.0 lb

## 2023-07-22 DIAGNOSIS — E538 Deficiency of other specified B group vitamins: Secondary | ICD-10-CM

## 2023-07-22 MED ORDER — CYANOCOBALAMIN 1000 MCG/ML IJ SOLN
1000.0000 ug | Freq: Once | INTRAMUSCULAR | Status: AC
  Administered 2023-07-22: 1000 ug via INTRAMUSCULAR

## 2023-07-22 NOTE — Progress Notes (Signed)
 Patient presents today for 2nd b12 injection. Injection was given in right deltoid. Pt tolerated injection well. Pt will return in 2 weeks for 3rd injection.

## 2023-07-26 ENCOUNTER — Encounter: Payer: Self-pay | Admitting: Nurse Practitioner

## 2023-07-26 DIAGNOSIS — E538 Deficiency of other specified B group vitamins: Secondary | ICD-10-CM | POA: Insufficient documentation

## 2023-07-26 DIAGNOSIS — E559 Vitamin D deficiency, unspecified: Secondary | ICD-10-CM | POA: Insufficient documentation

## 2023-07-26 DIAGNOSIS — M62838 Other muscle spasm: Secondary | ICD-10-CM | POA: Insufficient documentation

## 2023-07-26 DIAGNOSIS — M79605 Pain in left leg: Secondary | ICD-10-CM | POA: Insufficient documentation

## 2023-07-26 NOTE — Assessment & Plan Note (Signed)
 Declines shingrix, educated on disease process and is aware if he changes his mind to notify office

## 2023-07-26 NOTE — Assessment & Plan Note (Signed)
 She has had a low vitamin B12 will administer IM dose today and can come weekly x 4 weeks.

## 2023-07-26 NOTE — Assessment & Plan Note (Signed)
 She has muscle tension, will treat with muscle relaxer. She is also encouraged to increase her water intake

## 2023-07-26 NOTE — Assessment & Plan Note (Signed)
 Will refill vitamin d  supplement

## 2023-07-26 NOTE — Assessment & Plan Note (Addendum)
 She is encouraged to strive for BMI less than 30 to decrease cardiac risk. Advised to aim for at least 150 minutes of exercise per week. Discussed wegovy  side effects. Will sed if she can get approved. Denies family history of medullary thyroid cancer or personal history of pancreatitis.

## 2023-07-29 ENCOUNTER — Ambulatory Visit (INDEPENDENT_AMBULATORY_CARE_PROVIDER_SITE_OTHER)

## 2023-07-29 VITALS — BP 130/78 | HR 85 | Temp 98.5°F | Ht 62.0 in | Wt 238.0 lb

## 2023-07-29 DIAGNOSIS — E538 Deficiency of other specified B group vitamins: Secondary | ICD-10-CM

## 2023-07-29 MED ORDER — CYANOCOBALAMIN 1000 MCG/ML IJ SOLN
1000.0000 ug | Freq: Once | INTRAMUSCULAR | Status: AC
Start: 2023-07-29 — End: 2023-07-29
  Administered 2023-07-29: 1000 ug via INTRAMUSCULAR

## 2023-07-29 NOTE — Progress Notes (Signed)
 Patient is in office today for a nurse visit for her 3rd B12 Injection. Patient Injection was given in the  Right deltoid. Patient tolerated injection well.

## 2023-08-01 ENCOUNTER — Ambulatory Visit (HOSPITAL_COMMUNITY)
Admission: EM | Admit: 2023-08-01 | Discharge: 2023-08-01 | Disposition: A | Discharge status: Home / Self Care | Attending: Emergency Medicine | Admitting: Emergency Medicine

## 2023-08-01 ENCOUNTER — Encounter (HOSPITAL_COMMUNITY): Payer: Self-pay

## 2023-08-01 DIAGNOSIS — S29012A Strain of muscle and tendon of back wall of thorax, initial encounter: Secondary | ICD-10-CM | POA: Diagnosis not present

## 2023-08-01 LAB — POCT URINALYSIS DIP (MANUAL ENTRY)
Bilirubin, UA: NEGATIVE
Blood, UA: NEGATIVE
Glucose, UA: NEGATIVE mg/dL
Ketones, POC UA: NEGATIVE mg/dL
Leukocytes, UA: NEGATIVE
Nitrite, UA: NEGATIVE
Protein Ur, POC: NEGATIVE mg/dL
Spec Grav, UA: 1.02 (ref 1.010–1.025)
Urobilinogen, UA: 0.2 U/dL
pH, UA: 6.5 (ref 5.0–8.0)

## 2023-08-01 MED ORDER — DEXAMETHASONE SODIUM PHOSPHATE 10 MG/ML IJ SOLN
10.0000 mg | Freq: Once | INTRAMUSCULAR | Status: AC
  Administered 2023-08-01: 10 mg via INTRAMUSCULAR

## 2023-08-01 MED ORDER — METHOCARBAMOL 500 MG PO TABS: 500.0000 mg | ORAL_TABLET | Freq: Two times a day (BID) | ORAL | 0 refills | Status: DC

## 2023-08-01 MED ORDER — DEXAMETHASONE SODIUM PHOSPHATE 10 MG/ML IJ SOLN
INTRAMUSCULAR | Status: AC
  Filled 2023-08-01: qty 1

## 2023-08-01 MED ORDER — PREDNISONE 10 MG (21) PO TBPK: ORAL_TABLET | Freq: Every day | ORAL | 0 refills | Status: DC

## 2023-08-01 NOTE — Discharge Instructions (Signed)
 Your back pain appears to be muscular in nature.  Start the steroid taper pack tomorrow with breakfast.  We have given you a steroid injection today in clinic to help reduce inflammation.  You can heat and gently stretch.  Take a muscle relaxer up to 2 times daily might help relax his muscles.  Consider following up with sports medicine if this persist.  Seek immediate care for any shortness of breath, fever, or new concerning symptoms.

## 2023-08-01 NOTE — ED Provider Notes (Signed)
 [facility]    CSN: [id number] Arrival date & time: 08/01/23  1038      History   Chief Complaint Chief Complaint  Patient presents with   Back Pain    HPI Sharon Bond is a 58 y.o. female.   Patient presents to clinic for concern of right sided thoracic back pain that has been ongoing for the past 2 weeks.  Noticed initially was up under her scapula and can feel it whenever she takes deep breaths or goes to move her right arm.  Has pain with bending and twisting.  Has not had any recent falls or injuries.  Did recently strain her left leg from a fall and took muscle relaxers for that.  Had some leftover and noticed that taking them did not help her right subscapular pain.  Has not had any rashes, new traumas or injuries.  Denies urinary symptoms.  Without fever.  The history is provided by the patient and medical records.  Back Pain   Past Medical History:  Diagnosis Date   Allergy    pollen   Anxiety 2022   Eczema    GERD (gastroesophageal reflux disease)    Hypertension    IUD (intrauterine device) in place 2005   removed 2015   Mixed incontinence    Seasonal allergic rhinitis    Sleep apnea 10/2020   11/16/20--has not rec'd cpap yet   Wears glasses     Patient Active Problem List   Diagnosis Date Noted   Pain of left lower extremity 07/26/2023   Muscle spasm 07/26/2023   Vitamin D  deficiency 07/26/2023   Vitamin B12 deficiency 07/26/2023   Encounter for annual health examination 05/27/2023   COVID-19 vaccine administered 05/27/2023   Rash and nonspecific skin eruption 05/27/2023   Encounter for screening mammogram for breast cancer 05/27/2023   Hair loss 05/27/2023   Leukocytes in urine 05/27/2023   Seasonal allergies 05/27/2023   Herpes zoster vaccination declined 11/19/2022   COVID-19 vaccination declined 11/19/2022   Influenza vaccination declined 11/19/2022   Class 3 severe obesity due to excess calories with serious comorbidity and body  mass index (BMI) of 40.0 to 44.9 in adult 11/19/2022   Abnormal glucose 11/06/2022   Shortness of breath 11/06/2022   Chest pain 11/06/2022   Acute cough 11/06/2022   Elevated cholesterol 05/13/2022   Temporal pain 02/25/2022   Pain in joint of left knee 10/12/2021   Obstructive sleep apnea hypopnea, moderate 10/03/2020   Insomnia secondary to chronic pain 06/28/2020   Psychophysiological insomnia 06/28/2020   Class 3 severe obesity due to excess calories without serious comorbidity with body mass index (BMI) of 40.0 to 44.9 in adult 06/28/2020   Snoring 06/28/2020   Pain in right arm 05/31/2019   Dysmenorrhea 08/12/2017   Dyspareunia in female 08/12/2017   Fibroid uterus 08/12/2017   Menorrhagia with regular cycle 08/12/2017   Intrinsic eczema 02/27/2017   Obesity, Class III, BMI 40-49.9 (morbid obesity) 02/13/2017   Elevated blood pressure 10/07/2012   Hypokalemia 03/20/2006   Anxiety state 03/20/2006   Essential hypertension 03/20/2006   Rhinitis, allergic 03/20/2006   GASTROESOPHAGEAL REFLUX, NO ESOPHAGITIS 03/20/2006   FIBROMYALGIA, FIBROMYOSITIS 03/20/2006   MUSCLE CRAMPS NOS 03/20/2006    Past Surgical History:  Procedure Laterality Date   DILATATION & CURETTAGE/HYSTEROSCOPY WITH TRUECLEAR N/A 01/08/2013   Procedure: DILATATION & CURETTAGE/HYSTEROSCOPY WITH TRUCLEAR, polypectomy;  Surgeon: Burnard A. Kandyce, MD;  Location: [facility];  Service: Gynecology;  Laterality: N/A;  LAPAROSCOPIC TUBAL LIGATION Bilateral 01/08/2013   Procedure: DIAGNOSTIC LAPAROSCOPY Bilateral Salpingectomy;  Surgeon: Burnard A. Kandyce, MD;  Location: [facility];  Service: Gynecology;  Laterality: Bilateral;   PARTIAL HYSTERECTOMY  2019   WISDOM TOOTH EXTRACTION      OB History     Gravida  4   Para  4   Term  3   Preterm  1   AB      Living  2      SAB      IAB      Ectopic      Multiple      Live Births  2            Home Medications    Prior to Admission medications    Medication Sig Start Date End Date Taking? Authorizing Provider  albuterol  (VENTOLIN  HFA) 108 (90 Base) MCG/ACT inhaler Inhale 2 puffs into the lungs every 6 (six) hours as needed for wheezing or shortness of breath. 11/06/22  Yes Georgina Speaks, FNP  azelastine  (ASTELIN ) 0.1 % nasal spray Place 2 sprays into both nostrils 2 (two) times daily. Use in each nostril as directed 05/14/23  Yes Georgina Speaks, FNP  cetirizine  (ZYRTEC  ALLERGY) 10 MG tablet Take 1 tablet (10 mg total) by mouth at bedtime. 03/07/22 08/01/23 Yes Joesph Shaver Scales, PA-C  cyclobenzaprine  (FLEXERIL ) 10 MG tablet Take 1 tablet (10 mg total) by mouth 3 (three) times daily as needed. 07/16/23  Yes Georgina Speaks, FNP  diltiazem  (CARDIZEM ) 30 MG tablet Take 1 tablet (30 mg total) by mouth in the morning and at bedtime. 04/15/23  Yes Wyn Jackee VEAR Mickey., NP  meloxicam  (MOBIC ) 15 MG tablet Take 1 tablet (15 mg total) by mouth daily. 05/14/23  Yes Georgina Speaks, FNP  methocarbamol  (ROBAXIN ) 500 MG tablet Take 1 tablet (500 mg total) by mouth 2 (two) times daily. 08/01/23  Yes Yarisbel Miranda  N, FNP  Nebivolol  HCl (BYSTOLIC ) 20 MG TABS Take 1 tablet (20 mg total) by mouth daily. Can take an additional half tab as needed for palpitations 04/15/23  Yes Wyn Jackee VEAR Mickey., NP  predniSONE  (STERAPRED UNI-PAK 21 TAB) 10 MG (21) TBPK tablet Take by mouth daily. Take as prescribed in morning with breakfast. 08/01/23  Yes Anastasios Melander  N, FNP  spironolactone  (ALDACTONE ) 25 MG tablet Take 1 tablet (25 mg total) by mouth daily. 04/15/23  Yes Wyn Jackee VEAR Mickey., NP  triamcinolone  cream (KENALOG ) 0.1 % Apply 1 Application topically as needed. Pt using at least 4 times a day 05/14/23  Yes Georgina Speaks, FNP  valsartan  (DIOVAN ) 320 MG tablet Take 1 tablet (320 mg total) by mouth daily. 04/15/23  Yes Wyn Jackee VEAR Mickey., NP  Vitamin D , Ergocalciferol , (DRISDOL ) 1.25 MG (50000 UNIT) CAPS capsule Take 1 capsule (50,000 Units total) by mouth every 7 (seven)  days. 07/16/23  Yes Georgina Speaks, FNP  EPINEPHrine  0.3 mg/0.3 mL IJ SOAJ injection Inject 0.3 mg into the muscle as needed for anaphylaxis. 03/23/20   Georgina Speaks, FNP  fluticasone  (FLONASE ) 50 MCG/ACT nasal spray Place 1 spray into both nostrils daily. Patient taking differently: Place 1 spray into both nostrils as needed. 03/07/22   Joesph Shaver Scales, PA-C  Semaglutide -Weight Management (WEGOVY ) 0.5 MG/0.5ML SOAJ Inject 0.5 mg into the skin once a week. 07/16/23   Georgina Speaks, FNP    Family History Family History  Problem Relation Age of Onset   Diabetes Mother    Hypertension Mother    Alzheimer's disease  Mother    Healthy Father    Diabetes Brother    Heart disease Maternal Aunt    Healthy Son    Healthy Son    Hypertension Daughter    Healthy Daughter    Cancer Neg Hx    Stroke Neg Hx    Sleep apnea Neg Hx    Colon cancer Neg Hx    Colon polyps Neg Hx    Esophageal cancer Neg Hx    Rectal cancer Neg Hx    Stomach cancer Neg Hx     Social History Social History   Tobacco Use   Smoking status: Never    Passive exposure: Past   Smokeless tobacco: Never  Vaping Use   Vaping status: Never Used  Substance Use Topics   Alcohol use: Yes    Comment: occasionally during holidays, wine or beer   Drug use: Never     Allergies   Olmesartan , Amlodipine, and Penicillins   Review of Systems Review of Systems  Per HPI  Physical Exam Triage Vital Signs ED Triage Vitals  Encounter Vitals Group     BP 08/01/23 1109 129/74     Girls Systolic BP Percentile --      Girls Diastolic BP Percentile --      Boys Systolic BP Percentile --      Boys Diastolic BP Percentile --      Pulse Rate 08/01/23 1109 72     Resp 08/01/23 1109 18     Temp 08/01/23 1109 97.9 F (36.6 C)     Temp Source 08/01/23 1109 Oral     SpO2 08/01/23 1109 95 %     Weight 08/01/23 1108 235 lb (106.6 kg)     Height 08/01/23 1108 5' 2 (1.575 m)     Head Circumference --      Peak Flow --       Pain Score 08/01/23 1107 8     Pain Loc --      Pain Education --      Exclude from Growth Chart --    No data found.  Updated Vital Signs BP 129/74 (BP Location: Right Arm)   Pulse 72   Temp 97.9 F (36.6 C) (Oral)   Resp 18   Ht 5' 2 (1.575 m)   Wt 235 lb (106.6 kg)   LMP 11/28/2012   SpO2 95%   BMI 42.98 kg/m   Visual Acuity Right Eye Distance:   Left Eye Distance:   Bilateral Distance:    Right Eye Near:   Left Eye Near:    Bilateral Near:     Physical Exam Vitals and nursing note reviewed.  Constitutional:      Appearance: Normal appearance.  HENT:     Head: Normocephalic and atraumatic.     Right Ear: External ear normal.     Left Ear: External ear normal.     Nose: Nose normal.     Mouth/Throat:     Mouth: Mucous membranes are moist.  Eyes:     Conjunctiva/sclera: Conjunctivae normal.  Cardiovascular:     Rate and Rhythm: Normal rate and regular rhythm.     Heart sounds: Normal heart sounds. No murmur heard. Pulmonary:     Effort: Pulmonary effort is normal. No respiratory distress.     Breath sounds: Normal breath sounds.  Skin:    General: Skin is warm and dry.  Neurological:     General: No focal deficit present.     Mental  Status: She is alert.  Psychiatric:        Mood and Affect: Mood normal.      UC Treatments / Results  Labs (all labs ordered are listed, but only abnormal results are displayed) Labs Reviewed  POCT URINALYSIS DIP (MANUAL ENTRY)    EKG   Radiology No results found.  Procedures Procedures (including critical care time)  Medications Ordered in UC Medications  dexamethasone  (DECADRON ) injection 10 mg (has no administration in time range)    Initial Impression / Assessment and Plan / UC Course  I have reviewed the triage vital signs and the nursing notes.  Pertinent labs & imaging results that were available during my care of the patient were reviewed by me and considered in my medical decision making (see  chart for details).  Vitals and triage reviewed, patient is hemodynamically stable.  Lungs vesicular, heart with regular rate and rhythm.  Overlying skin with no rashes.  Pain appears to be muscular in nature, reproducible with palpation and range of motion.  Will treat with IM and oral steroids to treat inflammation and muscle relaxer as needed.  Plan of care, follow-up care return precautions given, no questions at this time.     Final Clinical Impressions(s) / UC Diagnoses   Final diagnoses:  Strain of thoracic back region     Discharge Instructions      Your back pain appears to be muscular in nature.  Start the steroid taper pack tomorrow with breakfast.  We have given you a steroid injection today in clinic to help reduce inflammation.  You can heat and gently stretch.  Take a muscle relaxer up to 2 times daily might help relax his muscles.  Consider following up with sports medicine if this persist.  Seek immediate care for any shortness of breath, fever, or new concerning symptoms.    ED Prescriptions     Medication Sig Dispense Auth. Provider   methocarbamol  (ROBAXIN ) 500 MG tablet Take 1 tablet (500 mg total) by mouth 2 (two) times daily. 20 tablet Dreama, Lesly Joslyn  N, FNP   predniSONE  (STERAPRED UNI-PAK 21 TAB) 10 MG (21) TBPK tablet Take by mouth daily. Take as prescribed in morning with breakfast. 21 tablet Dreama Elsworth SAILOR, FNP      PDMP not reviewed this encounter.   Dreama, Keyvon Herter  N, FNP 08/01/23 1210

## 2023-08-01 NOTE — ED Triage Notes (Signed)
 Patient presenting with right flank and back pain onset 2 weeks. No falls or injuries that the Patient knows of. Denies any urinary symptoms. Denies NVD Prescriptions or OTC medications tried: Yes- muscle relaxer   with no relief

## 2023-08-05 ENCOUNTER — Ambulatory Visit (INDEPENDENT_AMBULATORY_CARE_PROVIDER_SITE_OTHER): Payer: Self-pay

## 2023-08-05 VITALS — BP 128/80 | HR 67 | Temp 98.1°F | Ht 62.0 in | Wt 235.0 lb

## 2023-08-05 DIAGNOSIS — E538 Deficiency of other specified B group vitamins: Secondary | ICD-10-CM

## 2023-08-05 MED ORDER — CYANOCOBALAMIN 1000 MCG/ML IJ SOLN
1000.0000 ug | Freq: Once | INTRAMUSCULAR | Status: AC
  Administered 2023-08-05: 1000 ug via INTRAMUSCULAR

## 2023-08-05 NOTE — Patient Instructions (Signed)
 Vitamin B12 Deficiency Vitamin B12 deficiency means that your body does not have enough vitamin B12. The body needs this important vitamin: To make red blood cells. To make genes (DNA). To help the nerves work. If you do not have enough vitamin B12 in your body, you can have health problems, such as not having enough red blood cells in the blood (anemia). What are the causes? Not eating enough foods that contain vitamin B12. Not being able to take in (absorb) vitamin B12 from the food that you eat. Certain diseases. A condition in which the body does not make enough of a certain protein. This results in your body not taking in enough vitamin B12. Having a surgery in which part of the stomach or small intestine is taken out. Taking medicines that make it hard for the body to take in vitamin B12. These include: Heartburn medicines. Some medicines that are used to treat diabetes. What increases the risk? Being an older adult. Eating a vegetarian or vegan diet that does not include any foods that come from animals. Not eating enough foods that contain vitamin B12 while you are pregnant. Taking certain medicines. Having alcoholism. What are the signs or symptoms? In some cases, there are no symptoms. If the condition leads to too few blood cells or nerve damage, symptoms can occur, such as: Feeling weak or tired. Not being hungry. Losing feeling (numbness) or tingling in your hands and feet. Redness and burning of the tongue. Feeling sad (depressed). Confusion or memory problems. Trouble walking. If anemia is very bad, symptoms can include: Being short of breath. Being dizzy. Having a very fast heartbeat. How is this treated? Changing the way you eat and drink, such as: Eating more foods that contain vitamin B12. Drinking little or no alcohol. Getting vitamin B12 shots. Taking vitamin B12 supplements by mouth (orally). Your doctor will tell you the dose that is best for you. Follow  these instructions at home: Eating and drinking  Eat foods that come from animals and have a lot of vitamin B12 in them. These include: Meats and poultry. This includes beef, pork, chicken, [place], and organ meats, such as liver. Seafood, such as clams, rainbow trout, salmon, tuna, and haddock. Eggs. Dairy foods such as milk, yogurt, and cheese. Eat breakfast cereals that have vitamin B12 added to them (are fortified). Check the label. The items listed above may not be a complete list of foods and beverages you can eat and drink. Contact a dietitian for more information. Alcohol use Do not drink alcohol if: Your doctor tells you not to drink. You are pregnant, may be pregnant, or are planning to become pregnant. If you drink alcohol: Limit how much you have to: 0-1 drink a day for women. 0-2 drinks a day for men. Know how much alcohol is in your drink. In the [place] one drink equals one 12 oz bottle of beer (355 mL), one 5 oz glass of wine (148 mL), or one 1 oz glass of hard liquor (44 mL). General instructions Get any vitamin B12 shots if told by your doctor. Take supplements only as told by your doctor. Follow the directions. Keep all follow-up visits. Contact a doctor if: Your symptoms come back. Your symptoms get worse or do not get better with treatment. Get help right away if: You have trouble breathing. You have a very fast heartbeat. You have chest pain. You get dizzy. You faint. These symptoms may be an emergency. Get help right away. Call 911.  Do not wait to see if the symptoms will go away. Do not drive yourself to the hospital. Summary Vitamin B12 deficiency means that your body is not getting enough of the vitamin. In some cases, there are no symptoms of this condition. Treatment may include making a change in the way you eat and drink, getting shots, or taking supplements. Eat foods that have vitamin B12 in them. This information is not intended to replace advice  given to you by your health care provider. Make sure you discuss any questions you have with your health care provider. Document Revised: 09/01/2020 Document Reviewed: 09/01/2020 Elsevier Patient Education  2024 [employer].

## 2023-08-05 NOTE — Progress Notes (Signed)
 Patient presents today for 4th b12 injection. She states feeling good. She has follow up appointment at the end of this month with provider.

## 2023-08-21 ENCOUNTER — Ambulatory Visit: Admitting: Nurse Practitioner

## 2023-08-26 ENCOUNTER — Ambulatory Visit (INDEPENDENT_AMBULATORY_CARE_PROVIDER_SITE_OTHER)

## 2023-08-26 VITALS — BP 120/82 | HR 98 | Temp 98.6°F | Ht 62.0 in | Wt 235.0 lb

## 2023-08-26 DIAGNOSIS — E538 Deficiency of other specified B group vitamins: Secondary | ICD-10-CM | POA: Diagnosis not present

## 2023-08-26 MED ORDER — CYANOCOBALAMIN 1000 MCG/ML IJ SOLN
1000.0000 ug | Freq: Once | INTRAMUSCULAR | Status: AC
  Administered 2023-08-26: 1000 ug via INTRAMUSCULAR

## 2023-08-26 NOTE — Progress Notes (Signed)
 Patient is in office today for a nurse visit for B12 Injection. Patient Injection was given in the  Right deltoid. Patient tolerated injection well.

## 2023-08-27 ENCOUNTER — Ambulatory Visit: Payer: Self-pay | Admitting: Nurse Practitioner

## 2023-08-27 LAB — VITAMIN B12: Vitamin B-12: 520 pg/mL (ref 232–1245)

## 2023-09-22 ENCOUNTER — Other Ambulatory Visit: Payer: Self-pay | Admitting: Nurse Practitioner

## 2023-09-26 ENCOUNTER — Other Ambulatory Visit: Payer: Self-pay | Admitting: Nurse Practitioner

## 2023-09-26 DIAGNOSIS — F419 Anxiety disorder, unspecified: Secondary | ICD-10-CM

## 2023-09-29 ENCOUNTER — Telehealth: Payer: Self-pay | Admitting: Cardiology

## 2023-09-29 MED ORDER — NEBIVOLOL HCL 20 MG PO TABS: 20.0000 mg | ORAL_TABLET | Freq: Every day | ORAL | 1 refills | Status: AC

## 2023-09-29 NOTE — Telephone Encounter (Signed)
*  STAT* If patient is at the pharmacy, call can be transferred to refill team.   1. Which medications need to be refilled? (please list name of each medication and dose if known) Nebivolol  HCl (BYSTOLIC ) 20 MG TABS    2. Would you like to learn more about the convenience, safety, & potential cost savings by using the [facility]? No   3. Are you open to using the [facility] No   4. Which pharmacy/location (including street and city if local pharmacy) is medication to be sent to?[id number] - [place], [place] - [address]    5. Do they need a 30 day or 90 day supply? 90 Day Supply   Pt is completely out of medication. Patient is scheduled to see Dr. Sheena on 10/14/23

## 2023-09-29 NOTE — Telephone Encounter (Signed)
 Pt's medication was sent to pt's pharmacy as requested. Confirmation received.

## 2023-10-13 ENCOUNTER — Encounter: Payer: Self-pay | Admitting: *Deleted

## 2023-10-14 ENCOUNTER — Encounter: Payer: Self-pay | Admitting: Cardiology

## 2023-10-14 ENCOUNTER — Ambulatory Visit: Attending: Cardiology | Admitting: Cardiology

## 2023-10-14 VITALS — BP 124/80 | HR 66 | Ht 62.0 in | Wt 231.4 lb

## 2023-10-14 DIAGNOSIS — I10 Essential (primary) hypertension: Secondary | ICD-10-CM | POA: Insufficient documentation

## 2023-10-14 DIAGNOSIS — R7303 Prediabetes: Secondary | ICD-10-CM | POA: Insufficient documentation

## 2023-10-14 DIAGNOSIS — R0683 Snoring: Secondary | ICD-10-CM | POA: Insufficient documentation

## 2023-10-14 DIAGNOSIS — R400 Somnolence: Secondary | ICD-10-CM | POA: Insufficient documentation

## 2023-10-14 DIAGNOSIS — E66813 Obesity, class 3: Secondary | ICD-10-CM | POA: Insufficient documentation

## 2023-10-14 NOTE — Patient Instructions (Signed)
 Medication Instructions:  Your physician recommends that you continue on your current medications as directed. Please refer to the Current Medication list given to you today.  *If you need a refill on your cardiac medications before your next appointment, please call your pharmacy*  Testing/Procedures: WatchPAT? is an FDA-cleared portable home sleep study test that uses a watch and 3 points of contact to monitor 7 different channels, including your heart rate, oxygen saturation, body position, snoring, and chest motion.  The study is easy to use from the comfort of your own home and accurately detect sleep apnea.  Before bed, you attach the chest sensor, attached the sleep apnea bracelet to your nondominant hand, and attach the finger probe.  After the study, the raw data is downloaded from the watch and scored for apnea events.   For more information: [email or web address]  Patient Testing Instructions:  Once our office has received insurance approval for you to complete this test, we will contact you with a PIN to activate the device.  This typically takes 2-3 weeks.  Please do not open the box until approved.   Do not put battery into the device until bedtime when you are ready to begin the test. Please call the support number if you need assistance after following the instructions below: 24 hour support line- [phone] or [facility] support at [phone] (option 2)  Download the [facility] WatchPAT One app through the [employer] or [employer]. Be sure to turn on or enable access to Bluetooth in settings on your smartphone. Make sure no other Bluetooth devices are on and within the vicinity of your smartphone and WatchPAT watch during testing.  Make sure to leave your smart phone plugged in and charging all night.  When ready for bed:  Follow the instructions step by step in the WatchPAT One app to activate the testing device. For additional instructions, including  video instruction, visit the WatchPAT One video on Youtube. You can search for WatchPAT One within Youtube (video is 4 minutes and 18 seconds) or enter: https://youtube/watch?[email or web address] Please note: You will be prompted to enter a PIN to connect via Bluetooth when starting the test. The PIN will be assigned to you after insurance has approved the test.  The device is disposable, but it recommended that you retain the device until you receive a call letting you know the study has been received and the results have been interpreted.  We will let you know if the study did not transmit to us  properly after the test is completed. You do not need to call us  to confirm the receipt of the test.  Please complete the test within 48 hours of receiving PIN.   Frequently Asked Questions:  What is Watch PAT One?  A single use, fully disposable home sleep apnea testing device and will not need to be returned after completion.  What are the requirements to use WatchPAT One?  A successful WatchPAT One sleep study requires a WatchPAT One device, your smart phone, WatchPAT One app, your PIN number, and internet access. What type of phone do I need?  You should have a smart phone that uses Android 5.1 and above or any iPhone with IOS 10 and above. How can I download the WatchPAT one app?  Based on your device type search for WatchPAT One app either in [employer] for [employer] or [employer] for [employer]. Where will I get my PIN for the study?  Your PIN will be  provided by your physician's office after insurance has approved the test. This process typically takes 2-3 weeks. It is used for authentication and if you lose/forget your PIN, please reach out to your provider's office.  I do not have internet at home. Can I still complete a WatchPAT One study?  WatchPAT One needs internet connection throughout the night to be able to transmit the sleep data. You can use your home/local internet or your  cellular data package. However, it is always recommended to use home/local internet. It is estimated that between 20MB-30MB of data will be used with each study, but the application will be looking for space in the phone to start the study.  What happens if I lose internet or Bluetooth connection?  During the internet disconnection, your phone will not be able to transmit the sleep data.  All the data, will be stored in your phone.  As soon as the internet connection is back on, the phone will resume sending the sleep data. During the Bluetooth disconnection, WatchPAT One will not be able to to send the sleep data to your phone.  Data will be kept in the WatchPAT One until both devices have Bluetooth connection back on.  As soon as the connection is back on, WatchPAT one will send the sleep data to the phone.  How long do I need to wear the WatchPAT one?  After you start the study, you should wear the device at least 6 hours.  How far should I keep my phone from the device?  During the night, your phone should remain within 15 feet of where you sleep.  What happens if I leave the room for restroom or other reasons?  Leaving the room for any reason will not cause any problem. As soon as your get back to the room, both devices will reconnect and will continue to send the sleep data. Can I use my phone during the sleep study?  Yes, you can use your phone as usual during the study. But it is recommended to put your WatchPAT One on when you are ready to go to bed.  How will I get my study results?  A soon as you completed your study, your sleep data will be sent to the provider. They will then share the results with you when they are ready.    Follow-Up: At [facility], you and your health needs are our priority.  As part of our continuing mission to provide you with exceptional heart care, our providers are all part of one team.  This team includes your primary Cardiologist (physician)  and Advanced Practice Providers or APPs (Physician Assistants and Nurse Practitioners) who all work together to provide you with the care you need, when you need it.  Your next appointment:   1 year(s)  Provider:   Kardie Tobb, DO    Other Instructions Diabetes: Healthy Eating for Adults  When you have diabetes, also called diabetes mellitus, it's important to have healthy eating habits. Your blood sugar (glucose) levels are greatly affected by what you eat and drink. You need to eat healthy foods in the right amounts, at about the same times each day. Doing this can help you: Manage your blood sugar. Lower your risk of heart disease. Improve your blood pressure. Reach or stay at a healthy weight. What can affect my meal plan? Every person with diabetes is different. And each person has different needs for a meal plan. Your health care provider  may suggest that you work with an expert in healthy eating called a dietitian. They can help you make a meal plan that's best for you. How do carbohydrates affect me? Carbohydrates, also called carbs, affect your blood sugar level more than any other type of food. Eating carbs raises the amount of sugar in your blood. It's important to know how many carbs you can safely have in each meal. This is different for every person. Your dietitian can help you calculate how many carbs you should have at each meal and for each snack. How does alcohol affect me? Alcohol can cause a decrease in blood sugar (hypoglycemia), especially if you use insulin or take certain diabetes medicines by mouth. Hypoglycemia can be a life-threatening condition. Symptoms of hypoglycemia are similar to those of having too much alcohol. They include confusion, being sleepy, and feeling dizzy. Do not drink alcohol if: Your provider tells you not to drink. You're pregnant, may be pregnant, or plan to become pregnant. What are tips for following this plan? Reading food labels Start  by checking the serving size on the Nutrition Facts label of packaged foods and drinks. The number of calories and the amount of carbs, fats, and other nutrients listed on the label are based on one serving of the item. Many items contain more than one serving per package. Check the total grams (g) of carbs in one serving. Check the number of grams of saturated fats and trans fats in one serving. Choose foods that have a low amount or none of these fats. Check the number of milligrams (mg) of salt (sodium) in one serving. Most people should limit their total sodium intake to less than 2,300 mg per day. Always check the nutrition information of foods labeled as low-fat or nonfat. These foods may be higher in added sugar or refined carbs and should be avoided. Talk to your dietitian to identify your daily goals for nutrients listed on the label. Shopping Avoid buying canned, pre-made, or processed foods. These foods tend to be high in fat, sodium, and added sugar. Shop around the outside edge of the grocery store. This is where you'll most often find fresh fruits and vegetables, bulk grains, fresh meats, and fresh dairy products. Cooking Use low-heat cooking methods, such as baking, instead of high-heat methods like deep frying. Cook using healthy oils, such as olive, canola, or sunflower oil. Avoid cooking with butter, cream, or high-fat meats. Meal planning  Eat meals and snacks regularly. Try to eat them at the same times every day. Avoid going too long without eating. Eat foods that are high in fiber, such as fresh fruits, vegetables, beans, and whole grains. Eat 4-6 oz (112-168 g) of lean protein each day, such as lean meat, chicken, fish, eggs, or tofu. One ounce (oz) (28 g) of lean protein is equal to: 1 oz (28 g) of meat, chicken, or fish. 1 egg.  cup (62 g) of tofu. Eat some foods each day that contain healthy fats, such as avocado, nuts, seeds, and fish. What foods should I  eat? Fruits Berries. Apples. Oranges. Peaches. Apricots. Plums. Grapes. Mangoes. Papayas. Pomegranates. Kiwi. Cherries. Vegetables Leafy greens, including lettuce, spinach, kale, chard, collard greens, mustard greens, and cabbage. Beets. Cauliflower. Broccoli. Carrots. Green beans. Tomatoes. Peppers. Onions. Cucumbers. Brussels sprouts. Grains Whole grains, such as whole-wheat or whole-grain bread, crackers, tortillas, cereal, and pasta. Unsweetened oatmeal. Quinoa. Brown or wild rice. Meats and other proteins Seafood. Poultry without skin. Lean cuts of poultry and  beef. Tofu. Nuts. Seeds. Dairy Low-fat or fat-free dairy products such as milk, yogurt, and cheese. The items listed above may not be all the foods and drinks you can have. Talk with a dietitian to learn more. What foods should I avoid? Fruits Fruits canned with syrup. Vegetables Canned vegetables. Frozen vegetables with butter or cream sauce. Grains Refined white flour and flour products such as bread, pasta, snack foods, and cereals. Avoid all processed foods. Meats and other proteins Fatty cuts of meat. Poultry with skin. Breaded or fried meats. Processed meat. Avoid saturated fats. Dairy Full-fat yogurt, cheese, or milk. Beverages Sweetened drinks, such as soda or iced tea. The items listed above may not be all the foods and drinks you should avoid. Talk with a dietitian to learn more. Where to find more information: To learn more, go to: Academy of Nutrition and Dietetics at DeathPrevention.it. Click Search and type diabetes. Find the link you need. Centers for Disease Control and Prevention at TonerPromos.no. Click Search and type diabetes. Find the link you need. American Diabetes Association: diabetes.org/food-nutrition [employer] of Diabetes and Digestive and Kidney Diseases: StageSync.si This information is not intended to replace advice given to you by your health care provider. Make sure you discuss any  questions you have with your health care provider. Document Revised: 12/26/2022 Document Reviewed: 12/26/2022 Elsevier Patient Education  2025 [employer].

## 2023-10-14 NOTE — Progress Notes (Unsigned)
 Cardiology Office Note:    Date:  10/15/2023   ID:  SHAI RASMUSSEN, DOB July 30, 1965, MRN [id number]  PCP:  Georgina Speaks, FNP  Cardiologist:  Soo Steelman, DO  Electrophysiologist:  None   Referring MD: Georgina Speaks, FNP    I am doing well  History of Present Illness:    JOSELYNE Bond is a 58 y.o. female with a hx of Hypertension, obstructive sleep apnea, GERD, Anxiety, obesity presents for a follow up.   She previously follow with Dr. Hobart. She did she Sharon Alberts, NP in march 2025. At that time she was doing well.   During our visit she shared with me that she had mentioned in the past that she did not have sleep apnea based on previous reports.  She also notes that the palpitation has improved.  She admits to significant fatigue, snoring and some daytime somnolence.  Past Medical History:  Diagnosis Date   Allergy    pollen   Anxiety 2022   Eczema    GERD (gastroesophageal reflux disease)    Hypertension    IUD (intrauterine device) in place 2005   removed 2015   Mixed incontinence    Seasonal allergic rhinitis    Sleep apnea 10/2020   11/16/20--has not rec'd cpap yet   Wears glasses     Past Surgical History:  Procedure Laterality Date   DILATATION & CURETTAGE/HYSTEROSCOPY WITH TRUECLEAR N/A 01/08/2013   Procedure: DILATATION & CURETTAGE/HYSTEROSCOPY WITH TRUCLEAR, polypectomy;  Surgeon: Burnard A. Kandyce, MD;  Location: [facility];  Service: Gynecology;  Laterality: N/A;   LAPAROSCOPIC TUBAL LIGATION Bilateral 01/08/2013   Procedure: DIAGNOSTIC LAPAROSCOPY Bilateral Salpingectomy;  Surgeon: Burnard A. Kandyce, MD;  Location: [facility];  Service: Gynecology;  Laterality: Bilateral;   PARTIAL HYSTERECTOMY  2019   WISDOM TOOTH EXTRACTION      Current Medications: Current Meds  Medication Sig   albuterol  (VENTOLIN  HFA) 108 (90 Base) MCG/ACT inhaler Inhale 2 puffs into the lungs every 6 (six) hours as needed for wheezing or shortness of breath.   azelastine  (ASTELIN )  0.1 % nasal spray Place 2 sprays into both nostrils 2 (two) times daily. Use in each nostril as directed   cetirizine  (ZYRTEC  ALLERGY) 10 MG tablet Take 1 tablet (10 mg total) by mouth at bedtime.   cyclobenzaprine  (FLEXERIL ) 10 MG tablet Take 1 tablet (10 mg total) by mouth 3 (three) times daily as needed.   diltiazem  (CARDIZEM ) 30 MG tablet Take 1 tablet (30 mg total) by mouth in the morning and at bedtime.   EPINEPHrine  0.3 mg/0.3 mL IJ SOAJ injection Inject 0.3 mg into the muscle as needed for anaphylaxis.   meloxicam  (MOBIC ) 15 MG tablet TAKE 1 TABLET (15 MG TOTAL) BY MOUTH DAILY.   Nebivolol  HCl (BYSTOLIC ) 20 MG TABS Take 1 tablet (20 mg total) by mouth daily. Can take an additional half tab as needed for palpitations   spironolactone  (ALDACTONE ) 25 MG tablet Take 1 tablet (25 mg total) by mouth daily.   triamcinolone  cream (KENALOG ) 0.1 % Apply 1 Application topically as needed. Pt using at least 4 times a day   valsartan  (DIOVAN ) 320 MG tablet Take 1 tablet (320 mg total) by mouth daily.     Allergies:   Olmesartan , Amlodipine, and Penicillins   Social History   Socioeconomic History   Marital status: Married    Spouse name: Not on file   Number of children: Not on file   Years of education: Not on file  Highest education level: Not on file  Occupational History   Not on file  Tobacco Use   Smoking status: Never    Passive exposure: Past   Smokeless tobacco: Never  Vaping Use   Vaping status: Never Used  Substance and Sexual Activity   Alcohol use: Yes    Comment: occasionally during holidays, wine or beer   Drug use: Never   Sexual activity: Yes    Partners: Male    Birth control/protection: Surgical  Other Topics Concern   Not on file  Social History Narrative   married, 24 yo son, 81 yo, 13 yo, exercise - some with walking   Lives at home with spouse and 7 y.o. son   Left handed   Caffeine: decaf coffee on the weekend    Social Drivers of Research scientist (physical sciences) Strain: Not on file  Food Insecurity: No Food Insecurity (08/14/2021)   Hunger Vital Sign    Worried About Running Out of Food in the Last Year: Never true    Ran Out of Food in the Last Year: Never true  Transportation Needs: Not on file  Physical Activity: Not on file  Stress: Not on file  Social Connections: Unknown (06/04/2021)   Received from [employer]   Social Network    Social Network: Not on file     Family History: The patient's family history includes Alzheimer's disease in her mother; Diabetes in her brother and mother; Healthy in her daughter, father, son, and son; Heart disease in her maternal aunt; Hypertension in her daughter and mother. There is no history of Cancer, Stroke, Sleep apnea, Colon cancer, Colon polyps, Esophageal cancer, Rectal cancer, or Stomach cancer.  ROS:   Review of Systems  Constitution: Negative for decreased appetite, fever and weight gain.  HENT: Negative for congestion, ear discharge, hoarse voice and sore throat.   Eyes: Negative for discharge, redness, vision loss in right eye and visual halos.  Cardiovascular: Negative for chest pain, dyspnea on exertion, leg swelling, orthopnea and palpitations.  Respiratory: Negative for cough, hemoptysis, shortness of breath and snoring.   Endocrine: Negative for heat intolerance and polyphagia.  Hematologic/Lymphatic: Negative for bleeding problem. Does not bruise/bleed easily.  Skin: Negative for flushing, nail changes, rash and suspicious lesions.  Musculoskeletal: Negative for arthritis, joint pain, muscle cramps, myalgias, neck pain and stiffness.  Gastrointestinal: Negative for abdominal pain, bowel incontinence, diarrhea and excessive appetite.  Genitourinary: Negative for decreased libido, genital sores and incomplete emptying.  Neurological: Negative for brief paralysis, focal weakness, headaches and loss of balance.  Psychiatric/Behavioral: Negative for altered mental status, depression  and suicidal ideas.  Allergic/Immunologic: Negative for HIV exposure and persistent infections.    EKGs/Labs/Other Studies Reviewed:    The following studies were reviewed today:   EKG:  The ekg ordered today demonstrates   Recent Labs: 05/14/2023: ALT 15; BUN 9; Creatinine, Ser 0.71; Hemoglobin 14.0; Platelets 173; Potassium 4.7; Sodium 142; TSH 1.290  Recent Lipid Panel    Component Value Date/Time   CHOL 181 05/14/2023 0946   TRIG 108 05/14/2023 0946   HDL 51 05/14/2023 0946   CHOLHDL 3.5 05/14/2023 0946   CHOLHDL 3.5 07/18/2014 0001   VLDL 18 07/18/2014 0001   LDLCALC 111 (H) 05/14/2023 0946    Physical Exam:    VS:  BP 124/80 (BP Location: Right Arm, Patient Position: Sitting, Cuff Size: Large)   Pulse 66   Ht 5' 2 (1.575 m)   Wt 231 lb 6.4  oz (105 kg)   LMP 11/28/2012   SpO2 97%   BMI 42.32 kg/m     Wt Readings from Last 3 Encounters:  10/14/23 231 lb 6.4 oz (105 kg)  08/26/23 235 lb (106.6 kg)  08/05/23 235 lb (106.6 kg)     GEN: Well nourished, well developed in no acute distress HEENT: Normal NECK: No JVD; No carotid bruits LYMPHATICS: No lymphadenopathy CARDIAC: S1S2 noted,RRR, no murmurs, rubs, gallops RESPIRATORY:  Clear to auscultation without rales, wheezing or rhonchi  ABDOMEN: Soft, non-tender, non-distended, +bowel sounds, no guarding. EXTREMITIES: No edema, No cyanosis, no clubbing MUSCULOSKELETAL:  No deformity  SKIN: Warm and dry NEUROLOGIC:  Alert and oriented x 3, non-focal PSYCHIATRIC:  Normal affect, good insight  ASSESSMENT:    1. Snoring   2. Daytime somnolence   3. Obesity, Class III, BMI 40-49.9 (morbid obesity)   4. Primary hypertension   5. Prediabetes    PLAN:    1.  Hypertension-her blood pressure is appropriate in the office today no changes to her antihypertensive regimen this includes Nebivolol  20 mg daily, spironolactone  25 mg daily, valsartan  320 mg daily and diltiazem   2.  She has had palpitations for years no  significant arrhythmia noted on her prior monitor but she has tolerated Nebivolol  and Cardizem  and will continue this for now  3.  Her diagnosis of sleep apnea for me is in question she tells me she does not have sleep apnea but he has been documented that she had not tolerated CPAP in the past.  Does need to be reassessed her STOP-BANG score is 6 will set the patient up for get a more  4.  Obesity-i the patient understands the need to lose weight with diet and exercise. We have discussed specific strategies for this.  5.  Prediabetes-at this discussed with the patient about this her hemoglobin A1c recently was 6.0.  We talked about diet modifications.  The patient is in agreement with the above plan. The patient left the office in stable condition.  The patient will follow up in   Medication Adjustments/Labs and Tests Ordered: Current medicines are reviewed at length with the patient today.  Concerns regarding medicines are outlined above.  Orders Placed This Encounter  Procedures   [facility] Sleep Study   No orders of the defined types were placed in this encounter.   Patient Instructions  Medication Instructions:  Your physician recommends that you continue on your current medications as directed. Please refer to the Current Medication list given to you today.  *If you need a refill on your cardiac medications before your next appointment, please call your pharmacy*  Testing/Procedures: WatchPAT? is an FDA-cleared portable home sleep study test that uses a watch and 3 points of contact to monitor 7 different channels, including your heart rate, oxygen saturation, body position, snoring, and chest motion.  The study is easy to use from the comfort of your own home and accurately detect sleep apnea.  Before bed, you attach the chest sensor, attached the sleep apnea bracelet to your nondominant hand, and attach the finger probe.  After the study, the raw data is downloaded from the watch and  scored for apnea events.   For more information: [email or web address]  Patient Testing Instructions:  Once our office has received insurance approval for you to complete this test, we will contact you with a PIN to activate the device.  This typically takes 2-3 weeks.  Please do not open the box until approved.  Do not put battery into the device until bedtime when you are ready to begin the test. Please call the support number if you need assistance after following the instructions below: 24 hour support line- [phone] or [facility] support at [phone] (option 2)  Download the [facility] WatchPAT One app through the [employer] or [employer]. Be sure to turn on or enable access to Bluetooth in settings on your smartphone. Make sure no other Bluetooth devices are on and within the vicinity of your smartphone and WatchPAT watch during testing.  Make sure to leave your smart phone plugged in and charging all night.  When ready for bed:  Follow the instructions step by step in the WatchPAT One app to activate the testing device. For additional instructions, including video instruction, visit the WatchPAT One video on Youtube. You can search for WatchPAT One within Youtube (video is 4 minutes and 18 seconds) or enter: https://youtube/watch?[email or web address]_vbiwxE Please note: You will be prompted to enter a PIN to connect via Bluetooth when starting the test. The PIN will be assigned to you after insurance has approved the test.  The device is disposable, but it recommended that you retain the device until you receive a call letting you know the study has been received and the results have been interpreted.  We will let you know if the study did not transmit to us  properly after the test is completed. You do not need to call us  to confirm the receipt of the test.  Please complete the test within 48 hours of receiving PIN.   Frequently Asked Questions:  What is Watch PAT One?   A single use, fully disposable home sleep apnea testing device and will not need to be returned after completion.  What are the requirements to use WatchPAT One?  A successful WatchPAT One sleep study requires a WatchPAT One device, your smart phone, WatchPAT One app, your PIN number, and internet access. What type of phone do I need?  You should have a smart phone that uses Android 5.1 and above or any iPhone with IOS 10 and above. How can I download the WatchPAT one app?  Based on your device type search for WatchPAT One app either in [facility] for [employer] or [employer] for [employer]. Where will I get my PIN for the study?  Your PIN will be provided by your physician's office after insurance has approved the test. This process typically takes 2-3 weeks. It is used for authentication and if you lose/forget your PIN, please reach out to your provider's office.  I do not have internet at home. Can I still complete a WatchPAT One study?  WatchPAT One needs internet connection throughout the night to be able to transmit the sleep data. You can use your home/local internet or your cellular data package. However, it is always recommended to use home/local internet. It is estimated that between 20MB-30MB of data will be used with each study, but the application will be looking for space in the phone to start the study.  What happens if I lose internet or Bluetooth connection?  During the internet disconnection, your phone will not be able to transmit the sleep data.  All the data, will be stored in your phone.  As soon as the internet connection is back on, the phone will resume sending the sleep data. During the Bluetooth disconnection, WatchPAT One will not be able to to send the sleep data to your phone.  Data will be kept in the WatchPAT One until both devices have Bluetooth connection back on.  As soon as the connection is back on, WatchPAT one will send the sleep data to the  phone.  How long do I need to wear the WatchPAT one?  After you start the study, you should wear the device at least 6 hours.  How far should I keep my phone from the device?  During the night, your phone should remain within 15 feet of where you sleep.  What happens if I leave the room for restroom or other reasons?  Leaving the room for any reason will not cause any problem. As soon as your get back to the room, both devices will reconnect and will continue to send the sleep data. Can I use my phone during the sleep study?  Yes, you can use your phone as usual during the study. But it is recommended to put your WatchPAT One on when you are ready to go to bed.  How will I get my study results?  A soon as you completed your study, your sleep data will be sent to the provider. They will then share the results with you when they are ready.    Follow-Up: At [facility], you and your health needs are our priority.  As part of our continuing mission to provide you with exceptional heart care, our providers are all part of one team.  This team includes your primary Cardiologist (physician) and Advanced Practice Providers or APPs (Physician Assistants and Nurse Practitioners) who all work together to provide you with the care you need, when you need it.  Your next appointment:   1 year(s)  Provider:   Jaiyden Laur, DO    Other Instructions Diabetes: Healthy Eating for Adults  When you have diabetes, also called diabetes mellitus, it's important to have healthy eating habits. Your blood sugar (glucose) levels are greatly affected by what you eat and drink. You need to eat healthy foods in the right amounts, at about the same times each day. Doing this can help you: Manage your blood sugar. Lower your risk of heart disease. Improve your blood pressure. Reach or stay at a healthy weight. What can affect my meal plan? Every person with diabetes is different. And each person has different  needs for a meal plan. Your health care provider may suggest that you work with an expert in healthy eating called a dietitian. They can help you make a meal plan that's best for you. How do carbohydrates affect me? Carbohydrates, also called carbs, affect your blood sugar level more than any other type of food. Eating carbs raises the amount of sugar in your blood. It's important to know how many carbs you can safely have in each meal. This is different for every person. Your dietitian can help you calculate how many carbs you should have at each meal and for each snack. How does alcohol affect me? Alcohol can cause a decrease in blood sugar (hypoglycemia), especially if you use insulin or take certain diabetes medicines by mouth. Hypoglycemia can be a life-threatening condition. Symptoms of hypoglycemia are similar to those of having too much alcohol. They include confusion, being sleepy, and feeling dizzy. Do not drink alcohol if: Your provider tells you not to drink. You're pregnant, may be pregnant, or plan to become pregnant. What are tips for following this plan? Reading food labels Start by checking the serving size on the Nutrition Facts label of  packaged foods and drinks. The number of calories and the amount of carbs, fats, and other nutrients listed on the label are based on one serving of the item. Many items contain more than one serving per package. Check the total grams (g) of carbs in one serving. Check the number of grams of saturated fats and trans fats in one serving. Choose foods that have a low amount or none of these fats. Check the number of milligrams (mg) of salt (sodium) in one serving. Most people should limit their total sodium intake to less than 2,300 mg per day. Always check the nutrition information of foods labeled as low-fat or nonfat. These foods may be higher in added sugar or refined carbs and should be avoided. Talk to your dietitian to identify your daily  goals for nutrients listed on the label. Shopping Avoid buying canned, pre-made, or processed foods. These foods tend to be high in fat, sodium, and added sugar. Shop around the outside edge of the grocery store. This is where you'll most often find fresh fruits and vegetables, bulk grains, fresh meats, and fresh dairy products. Cooking Use low-heat cooking methods, such as baking, instead of high-heat methods like deep frying. Cook using healthy oils, such as olive, canola, or sunflower oil. Avoid cooking with butter, cream, or high-fat meats. Meal planning  Eat meals and snacks regularly. Try to eat them at the same times every day. Avoid going too long without eating. Eat foods that are high in fiber, such as fresh fruits, vegetables, beans, and whole grains. Eat 4-6 oz (112-168 g) of lean protein each day, such as lean meat, chicken, fish, eggs, or tofu. One ounce (oz) (28 g) of lean protein is equal to: 1 oz (28 g) of meat, chicken, or fish. 1 egg.  cup (62 g) of tofu. Eat some foods each day that contain healthy fats, such as avocado, nuts, seeds, and fish. What foods should I eat? Fruits Berries. Apples. Oranges. Peaches. Apricots. Plums. Grapes. Mangoes. Papayas. Pomegranates. Kiwi. Cherries. Vegetables Leafy greens, including lettuce, spinach, kale, chard, collard greens, mustard greens, and cabbage. Beets. Cauliflower. Broccoli. Carrots. Green beans. Tomatoes. Peppers. Onions. Cucumbers. Brussels sprouts. Grains Whole grains, such as whole-wheat or whole-grain bread, crackers, tortillas, cereal, and pasta. Unsweetened oatmeal. Quinoa. Brown or wild rice. Meats and other proteins Seafood. Poultry without skin. Lean cuts of poultry and beef. Tofu. Nuts. Seeds. Dairy Low-fat or fat-free dairy products such as milk, yogurt, and cheese. The items listed above may not be all the foods and drinks you can have. Talk with a dietitian to learn more. What foods should I  avoid? Fruits Fruits canned with syrup. Vegetables Canned vegetables. Frozen vegetables with butter or cream sauce. Grains Refined white flour and flour products such as bread, pasta, snack foods, and cereals. Avoid all processed foods. Meats and other proteins Fatty cuts of meat. Poultry with skin. Breaded or fried meats. Processed meat. Avoid saturated fats. Dairy Full-fat yogurt, cheese, or milk. Beverages Sweetened drinks, such as soda or iced tea. The items listed above may not be all the foods and drinks you should avoid. Talk with a dietitian to learn more. Where to find more information: To learn more, go to: Academy of Nutrition and Dietetics at DeathPrevention.it. Click Search and type diabetes. Find the link you need. Centers for Disease Control and Prevention at TonerPromos.no. Click Search and type diabetes. Find the link you need. American Diabetes Association: diabetes.org/food-nutrition [employer] of Diabetes and Digestive and Kidney Diseases:  StageSync.si This information is not intended to replace advice given to you by your health care provider. Make sure you discuss any questions you have with your health care provider. Document Revised: 12/26/2022 Document Reviewed: 12/26/2022 Elsevier Patient Education  2025 [employer].    Adopting a Healthy Lifestyle.  Know what a healthy weight is for you (roughly BMI <25) and aim to maintain this   Aim for 7+ servings of fruits and vegetables daily   65-80+ fluid ounces of water or unsweet tea for healthy kidneys   Limit to max 1 drink of alcohol per day; avoid smoking/tobacco   Limit animal fats in diet for cholesterol and heart health - choose grass fed whenever available   Avoid highly processed foods, and foods high in saturated/trans fats   Aim for low stress - take time to unwind and care for your mental health   Aim for 150 min of moderate intensity exercise weekly for heart health, and weights twice  weekly for bone health   Aim for 7-9 hours of sleep daily   When it comes to diets, agreement about the perfect plan isnt easy to find, even among the experts. Experts at the [facility] of [employer] developed an idea known as the Healthy Eating Plate. Just imagine a plate divided into logical, healthy portions.   The emphasis is on diet quality:   Load up on vegetables and fruits - one-half of your plate: Aim for color and variety, and remember that potatoes dont count.   Go for whole grains - one-quarter of your plate: Whole wheat, barley, wheat berries, quinoa, oats, brown rice, and foods made with them. If you want pasta, go with whole wheat pasta.   Protein power - one-quarter of your plate: Fish, chicken, beans, and nuts are all healthy, versatile protein sources. Limit red meat.   The diet, however, does go beyond the plate, offering a few other suggestions.   Use healthy plant oils, such as olive, canola, soy, corn, sunflower and peanut. Check the labels, and avoid partially hydrogenated oil, which have unhealthy trans fats.   If youre thirsty, drink water. Coffee and tea are good in moderation, but skip sugary drinks and limit milk and dairy products to one or two daily servings.   The type of carbohydrate in the diet is more important than the amount. Some sources of carbohydrates, such as vegetables, fruits, whole grains, and beans-are healthier than others.   Finally, stay active  Signed, Dub Huntsman, DO  10/15/2023 1:54 PM    [facility]

## 2023-10-18 ENCOUNTER — Other Ambulatory Visit: Payer: Self-pay

## 2023-10-18 ENCOUNTER — Emergency Department (HOSPITAL_BASED_OUTPATIENT_CLINIC_OR_DEPARTMENT_OTHER)
Admission: EM | Admit: 2023-10-18 | Discharge: 2023-10-18 | Disposition: A | Discharge status: Home / Self Care | Attending: Emergency Medicine | Admitting: Emergency Medicine

## 2023-10-18 DIAGNOSIS — U071 COVID-19: Secondary | ICD-10-CM | POA: Insufficient documentation

## 2023-10-18 DIAGNOSIS — I10 Essential (primary) hypertension: Secondary | ICD-10-CM | POA: Insufficient documentation

## 2023-10-18 DIAGNOSIS — Z79899 Other long term (current) drug therapy: Secondary | ICD-10-CM | POA: Insufficient documentation

## 2023-10-18 DIAGNOSIS — R059 Cough, unspecified: Secondary | ICD-10-CM | POA: Diagnosis present

## 2023-10-18 LAB — RESP PANEL BY RT-PCR (RSV, FLU A&B, COVID)  RVPGX2
Influenza A by PCR: NEGATIVE
Influenza B by PCR: NEGATIVE
Resp Syncytial Virus by PCR: NEGATIVE
SARS Coronavirus 2 by RT PCR: POSITIVE — AB

## 2023-10-18 NOTE — Discharge Instructions (Signed)
 Take Tylenol  1000 mg rotated with ibuprofen  600 mg every 4 hours as needed for pain or fever.  Drink plenty of fluids and get plenty of rest.  You can take over-the-counter medications as needed for relief of symptoms.  Return to the ER if you develop any new and/or concerning issues.

## 2023-10-18 NOTE — ED Provider Notes (Signed)
 Sharon Bond   CSN: [id number] Arrival date & time: 10/18/23  9472     Patient presents with: Sore Throat   Sharon Bond is a 58 y.o. female.   Patient is a 58 year old female with history of hypertension, GERD, anxiety.  Patient presenting today with complaints of sore throat, cough, and congestion for the past 3 days.  No fevers or chills.  No aggravating or alleviating factors.       Prior to Admission medications   Medication Sig Start Date End Date Taking? Authorizing Provider  albuterol  (VENTOLIN  HFA) 108 (90 Base) MCG/ACT inhaler Inhale 2 puffs into the lungs every 6 (six) hours as needed for wheezing or shortness of breath. 11/06/22   Moore, Janece, FNP  azelastine  (ASTELIN ) 0.1 % nasal spray Place 2 sprays into both nostrils 2 (two) times daily. Use in each nostril as directed 05/14/23   Georgina Speaks, FNP  cetirizine  (ZYRTEC  ALLERGY) 10 MG tablet Take 1 tablet (10 mg total) by mouth at bedtime. 03/07/22 10/14/23  Joesph Shaver Scales, PA-C  cyclobenzaprine  (FLEXERIL ) 10 MG tablet Take 1 tablet (10 mg total) by mouth 3 (three) times daily as needed. 07/16/23   Georgina Speaks, FNP  diltiazem  (CARDIZEM ) 30 MG tablet Take 1 tablet (30 mg total) by mouth in the morning and at bedtime. 04/15/23   Wyn Jackee VEAR Mickey., NP  EPINEPHrine  0.3 mg/0.3 mL IJ SOAJ injection Inject 0.3 mg into the muscle as needed for anaphylaxis. 03/23/20   Georgina Speaks, FNP  fluticasone  (FLONASE ) 50 MCG/ACT nasal spray Place 1 spray into both nostrils daily. Patient not taking: Reported on 10/14/2023 03/07/22   Joesph Shaver Scales, PA-C  meloxicam  (MOBIC ) 15 MG tablet TAKE 1 TABLET (15 MG TOTAL) BY MOUTH DAILY. 09/23/23   Georgina Speaks, FNP  methocarbamol  (ROBAXIN ) 500 MG tablet Take 1 tablet (500 mg total) by mouth 2 (two) times daily. Patient not taking: Reported on 10/14/2023 08/01/23   Dreama, Georgia  N, FNP  Nebivolol  HCl (BYSTOLIC ) 20 MG TABS Take 1  tablet (20 mg total) by mouth daily. Can take an additional half tab as needed for palpitations 09/29/23   Wyn Jackee VEAR Mickey., NP  predniSONE  (STERAPRED UNI-PAK 21 TAB) 10 MG (21) TBPK tablet Take by mouth daily. Take as prescribed in morning with breakfast. Patient not taking: Reported on 10/14/2023 08/01/23   Dreama, Georgia  N, FNP  Semaglutide -Weight Management (WEGOVY ) 0.5 MG/0.5ML SOAJ Inject 0.5 mg into the skin once a week. Patient not taking: Reported on 10/14/2023 07/16/23   Georgina Speaks, FNP  spironolactone  (ALDACTONE ) 25 MG tablet Take 1 tablet (25 mg total) by mouth daily. 04/15/23   Wyn Jackee VEAR Mickey., NP  triamcinolone  cream (KENALOG ) 0.1 % Apply 1 Application topically as needed. Pt using at least 4 times a day 05/14/23   Georgina Speaks, FNP  valsartan  (DIOVAN ) 320 MG tablet Take 1 tablet (320 mg total) by mouth daily. 04/15/23   Wyn Jackee VEAR Mickey., NP  Vitamin D , Ergocalciferol , (DRISDOL ) 1.25 MG (50000 UNIT) CAPS capsule Take 1 capsule (50,000 Units total) by mouth every 7 (seven) days. Patient not taking: Reported on 10/14/2023 07/16/23   Georgina Speaks, FNP    Allergies: Olmesartan , Amlodipine, and Penicillins    Review of Systems  All other systems reviewed and are negative.   Updated Vital Signs BP (!) 144/82 (BP Location: Left Arm)   Pulse 86   Temp 98.5 F (36.9 C)   Resp 20  Ht 5' 2 (1.575 m)   Wt 103.9 kg   LMP 11/28/2012   SpO2 98%   BMI 41.88 kg/m   Physical Exam Vitals and nursing Bond reviewed.  Constitutional:      General: She is not in acute distress.    Appearance: She is well-developed. She is not diaphoretic.  HENT:     Head: Normocephalic and atraumatic.     Mouth/Throat:     Mouth: Mucous membranes are moist.     Pharynx: No oropharyngeal exudate or posterior oropharyngeal erythema.  Cardiovascular:     Rate and Rhythm: Normal rate and regular rhythm.     Heart sounds: No murmur heard.    No friction rub. No gallop.  Pulmonary:     Effort:  Pulmonary effort is normal. No respiratory distress.     Breath sounds: Normal breath sounds. No wheezing.  Abdominal:     General: Bowel sounds are normal. There is no distension.     Palpations: Abdomen is soft.     Tenderness: There is no abdominal tenderness.  Musculoskeletal:        General: Normal range of motion.     Cervical back: Normal range of motion and neck supple.  Skin:    General: Skin is warm and dry.  Neurological:     General: No focal deficit present.     Mental Status: She is alert and oriented to person, place, and time.     (all labs ordered are listed, but only abnormal results are displayed) Labs Reviewed  GROUP A STREP BY PCR  RESP PANEL BY RT-PCR (RSV, FLU A&B, COVID)  [id number]    EKG: None  Radiology: No results found.   Procedures   Medications Ordered in the ED - No data to display                                  Medical Decision Making  Patient presenting with URI symptoms as described in the HPI.  COVID test is positive.  Patient's vital signs are stable with no hypoxia and I feel can safely be discharged.  Patient to take over-the-counter medications, Tylenol , ibuprofen , rest, drink plenty of fluids, and return as needed.     Final diagnoses:  None    ED Discharge Orders     None          Geroldine Berg, MD 10/18/23 [phone]

## 2023-10-18 NOTE — ED Triage Notes (Signed)
 Wednesday pt began having severe sore throat and non productive cough. No other symptoms. Pt had fever on wed but unsure now.

## 2023-11-13 ENCOUNTER — Ambulatory Visit
Admission: RE | Admit: 2023-11-13 | Discharge: 2023-11-13 | Disposition: A | Source: Ambulatory Visit | Attending: Nurse Practitioner | Admitting: Nurse Practitioner

## 2023-11-13 ENCOUNTER — Encounter: Payer: Self-pay | Admitting: Nurse Practitioner

## 2023-11-13 ENCOUNTER — Ambulatory Visit: Admitting: Nurse Practitioner

## 2023-11-13 VITALS — BP 120/80 | HR 74 | Temp 98.3°F | Ht 62.0 in | Wt 231.0 lb

## 2023-11-13 DIAGNOSIS — M79652 Pain in left thigh: Secondary | ICD-10-CM

## 2023-11-13 DIAGNOSIS — R2242 Localized swelling, mass and lump, left lower limb: Secondary | ICD-10-CM

## 2023-11-13 DIAGNOSIS — E7800 Pure hypercholesterolemia, unspecified: Secondary | ICD-10-CM | POA: Diagnosis not present

## 2023-11-13 DIAGNOSIS — G4733 Obstructive sleep apnea (adult) (pediatric): Secondary | ICD-10-CM

## 2023-11-13 DIAGNOSIS — I10 Essential (primary) hypertension: Secondary | ICD-10-CM | POA: Diagnosis not present

## 2023-11-13 DIAGNOSIS — Z6841 Body Mass Index (BMI) 40.0 and over, adult: Secondary | ICD-10-CM

## 2023-11-13 DIAGNOSIS — R7309 Other abnormal glucose: Secondary | ICD-10-CM

## 2023-11-13 DIAGNOSIS — Z2821 Immunization not carried out because of patient refusal: Secondary | ICD-10-CM

## 2023-11-13 DIAGNOSIS — Z139 Encounter for screening, unspecified: Secondary | ICD-10-CM

## 2023-11-13 NOTE — Assessment & Plan Note (Signed)
 Cholesterol levels are stable. Continue focusing on low-fat diet.  Increase fiber intake as well

## 2023-11-13 NOTE — Assessment & Plan Note (Signed)
 pain with swelling, tenderness, no palpable lump. Differential includes muscle strain or lymph node swelling. - Order ultrasound of left lower extremity to evaluate abnormalities. - Order CBC to check white blood cell count. - Consider sports medicine referral if ultrasound inconclusive and muscle strain suspected.

## 2023-11-13 NOTE — Assessment & Plan Note (Signed)
 Blood pressure stabilized post-COVID-19 illness. - Advise Coricidin HBP for cold symptoms.

## 2023-11-13 NOTE — Progress Notes (Signed)
 Sharon Kristeen JINNY Gladis, CMA,acting as a Neurosurgeon for Gaines Ada, FNP.,have documented all relevant documentation on the behalf of Gaines Ada, FNP,as directed by  Gaines Ada, FNP while in the presence of Gaines Ada, FNP.  Subjective:  Patient ID: Sharon Bond , female    DOB: 07/27/1965 , 58 y.o.   MRN: [id number]  Chief Complaint  Patient presents with   Hypertension    Patient presents today for a bp and pre dm follow up, Patient reports compliance with medication. Patient denies any chest pain, SOB, or headaches.    Leg Pain    Patient reports she has left leg pain from when she pulled a muscle months ago, she reports the pain comes and goes but it also causes swelling.     HPI  Discussed the use of AI scribe software for clinical note transcription with the patient, who gave verbal consent to proceed.  History of Present Illness Sharon Bond is a 58 year old female with hypertension who presents for a blood pressure follow-up.  Her blood pressure has been stable, but she experienced an elevation to 140/90 mmHg during a recent COVID-19 infection last month. She was symptomatic for about a week with fatigue and drainage as aftereffects. She took Coricidin HBP during her illness, which she found suitable for her condition.  She has a history of sleep apnea diagnosed in 2022, but she has not been using a CPAP machine. Her cardiologist has ordered another sleep study as the previous results were not available to the Cardiologist  She reports persistent leg pain in her left leg, initially thought to be a pulled muscle. The pain is located in the inner thigh near the pelvic bone and is associated with swelling. Her husband massages the area and noted a hard lump, although she describes the area as very tender. No urinary issues are present, but she mentions frequent urination due to high water intake.   Past Medical History:  Diagnosis Date   Allergy    pollen   Anxiety 2022   Eczema     GERD (gastroesophageal reflux disease)    Hypertension    IUD (intrauterine device) in place 2005   removed 2015   Mixed incontinence    Seasonal allergic rhinitis    Sleep apnea 10/2020   11/16/20--has not rec'd cpap yet   Wears glasses      Family History  Problem Relation Age of Onset   Diabetes Mother    Hypertension Mother    Alzheimer's disease Mother    Healthy Father    Diabetes Brother    Heart disease Maternal Aunt    Healthy Son    Healthy Son    Hypertension Daughter    Healthy Daughter    Cancer Neg Hx    Stroke Neg Hx    Sleep apnea Neg Hx    Colon cancer Neg Hx    Colon polyps Neg Hx    Esophageal cancer Neg Hx    Rectal cancer Neg Hx    Stomach cancer Neg Hx      Current Outpatient Medications:    albuterol  (VENTOLIN  HFA) 108 (90 Base) MCG/ACT inhaler, Inhale 2 puffs into the lungs every 6 (six) hours as needed for wheezing or shortness of breath., Disp: 18 g, Rfl: 2   azelastine  (ASTELIN ) 0.1 % nasal spray, Place 2 sprays into both nostrils 2 (two) times daily. Use in each nostril as directed, Disp: 30 mL, Rfl: 2   cetirizine  (ZYRTEC   ALLERGY) 10 MG tablet, Take 1 tablet (10 mg total) by mouth at bedtime., Disp: 90 tablet, Rfl: 1   cyclobenzaprine  (FLEXERIL ) 10 MG tablet, Take 1 tablet (10 mg total) by mouth 3 (three) times daily as needed., Disp: 30 tablet, Rfl: 0   diltiazem  (CARDIZEM ) 30 MG tablet, Take 1 tablet (30 mg total) by mouth in the morning and at bedtime., Disp: 180 tablet, Rfl: 3   EPINEPHrine  0.3 mg/0.3 mL IJ SOAJ injection, Inject 0.3 mg into the muscle as needed for anaphylaxis., Disp: 1 each, Rfl: 1   meloxicam  (MOBIC ) 15 MG tablet, TAKE 1 TABLET (15 MG TOTAL) BY MOUTH DAILY., Disp: 30 tablet, Rfl: 1   Nebivolol  HCl (BYSTOLIC ) 20 MG TABS, Take 1 tablet (20 mg total) by mouth daily. Can take an additional half tab as needed for palpitations, Disp: 135 tablet, Rfl: 1   spironolactone  (ALDACTONE ) 25 MG tablet, Take 1 tablet (25 mg total) by  mouth daily., Disp: 90 tablet, Rfl: 3   triamcinolone  cream (KENALOG ) 0.1 %, Apply 1 Application topically as needed. Pt using at least 4 times a day, Disp: 30 g, Rfl: 5   valsartan  (DIOVAN ) 320 MG tablet, Take 1 tablet (320 mg total) by mouth daily., Disp: 90 tablet, Rfl: 3   Allergies  Allergen Reactions   Olmesartan      cough   Amlodipine Cough   Penicillins Rash and Other (See Comments)    Hives, rash. No airway symptoms      Review of Systems  Constitutional: Negative.   Respiratory: Negative.    Gastrointestinal: Negative.   Musculoskeletal:        Left left pain  Neurological:  Negative for dizziness and headaches.  Psychiatric/Behavioral: Negative.       Today's Vitals   11/13/23 0952  BP: 120/80  Pulse: 74  Temp: 98.3 F (36.8 C)  TempSrc: Oral  Weight: 231 lb (104.8 kg)  Height: 5' 2 (1.575 m)  PainSc: 7   PainLoc: Leg   Body mass index is 42.25 kg/m.  Wt Readings from Last 3 Encounters:  11/13/23 231 lb (104.8 kg)  10/18/23 229 lb (103.9 kg)  10/14/23 231 lb 6.4 oz (105 kg)      Objective:  Physical Exam Vitals and nursing note reviewed.  Constitutional:      General: She is not in acute distress.    Appearance: Normal appearance. She is obese.  Cardiovascular:     Rate and Rhythm: Normal rate and regular rhythm.     Pulses: Normal pulses.     Heart sounds: Normal heart sounds. No murmur heard. Pulmonary:     Effort: Pulmonary effort is normal. No respiratory distress.     Breath sounds: Normal breath sounds. No wheezing.  Musculoskeletal:        General: Tenderness (left inner thigh, lump) present. No swelling or deformity. Normal range of motion.  Skin:    General: Skin is warm and dry.     Capillary Refill: Capillary refill takes less than 2 seconds.  Neurological:     General: No focal deficit present.     Mental Status: She is alert and oriented to person, place, and time.     Cranial Nerves: No cranial nerve deficit.     Motor: No  weakness.  Psychiatric:        Mood and Affect: Mood normal.        Behavior: Behavior normal.        Thought Content: Thought content normal.  Judgment: Judgment normal.      Assessment And Plan:  Essential hypertension Assessment & Plan: Blood pressure stabilized post-COVID-19 illness. - Advise Coricidin HBP for cold symptoms.  Orders: -     BMP8+eGFR  Abnormal glucose Assessment & Plan: Hemoglobin A1c stable.  She is currently focusing on her diet and exercise.  Will recheck levels today.  Orders: -     Hemoglobin A1c  Elevated cholesterol Assessment & Plan: Cholesterol levels are stable. Continue focusing on low-fat diet.  Increase fiber intake as well  Orders: -     Lipid panel  Influenza vaccination declined  Herpes zoster vaccination declined  Morbid obesity with BMI of 40.0-44.9, adult [facility]) Assessment & Plan: She is encouraged to strive for BMI less than 30 to decrease cardiac risk. Advised to aim for at least 150 minutes of exercise per week.    Tetanus, diphtheria, and acellular pertussis (Tdap) vaccination declined  Encounter for screening -     Hepatitis B surface antibody,qualitative  Lump of left thigh -     US  LT LOWER EXTREM LTD SOFT TISSUE NON VASCULAR; Future -     CBC with Differential/Platelet  Left thigh pain Assessment & Plan: pain with swelling, tenderness, no palpable lump. Differential includes muscle strain or lymph node swelling. - Order ultrasound of left lower extremity to evaluate abnormalities. - Order CBC to check white blood cell count. - Consider sports medicine referral if ultrasound inconclusive and muscle strain suspected.  Orders: -     CBC with Differential/Platelet  Obstructive sleep apnea hypopnea, moderate Assessment & Plan: Previous sleep study in 2022. Repeat study ordered by cardiologist, results pending. - Follow up with cardiologist for sleep study results.      No follow-ups on file.  Patient  was given opportunity to ask questions. Patient verbalized understanding of the plan and was able to repeat key elements of the plan. All questions were answered to their satisfaction.    Sharon Gaines Ada, FNP, have reviewed all documentation for this visit. The documentation on 11/13/23 for the exam, diagnosis, procedures, and orders are all accurate and complete.   IF YOU HAVE BEEN REFERRED TO A SPECIALIST, IT MAY TAKE 1-2 WEEKS TO SCHEDULE/PROCESS THE REFERRAL. IF YOU HAVE NOT HEARD FROM US /SPECIALIST IN TWO WEEKS, PLEASE GIVE US  A CALL AT [phone].

## 2023-11-13 NOTE — Assessment & Plan Note (Signed)
 Previous sleep study in 2022. Repeat study ordered by cardiologist, results pending. - Follow up with cardiologist for sleep study results.

## 2023-11-13 NOTE — Assessment & Plan Note (Signed)
 Hemoglobin A1c stable.  She is currently focusing on her diet and exercise.  Will recheck levels today.

## 2023-11-13 NOTE — Assessment & Plan Note (Signed)
 She is encouraged to strive for BMI less than 30 to decrease cardiac risk. Advised to aim for at least 150 minutes of exercise per week.

## 2023-11-14 LAB — BMP8+EGFR
BUN/Creatinine Ratio: 15 (ref 9–23)
BUN: 12 mg/dL (ref 6–24)
CO2: 19 mmol/L — ABNORMAL LOW (ref 20–29)
Calcium: 9.7 mg/dL (ref 8.7–10.2)
Chloride: 106 mmol/L (ref 96–106)
Creatinine, Ser: 0.8 mg/dL (ref 0.57–1.00)
Glucose: 96 mg/dL (ref 70–99)
Potassium: 4.4 mmol/L (ref 3.5–5.2)
Sodium: 144 mmol/L (ref 134–144)
eGFR: 86 mL/min/1.73 (ref >59)

## 2023-11-14 LAB — CBC WITH DIFFERENTIAL/PLATELET
Basophils Absolute: 0.1 x10E3/uL (ref 0.0–0.2)
Basos: 1 %
EOS (ABSOLUTE): 0.2 x10E3/uL (ref 0.0–0.4)
Eos: 3 %
Hematocrit: 44.5 % (ref 34.0–46.6)
Hemoglobin: 14.3 g/dL (ref 11.1–15.9)
Immature Grans (Abs): 0 x10E3/uL (ref 0.0–0.1)
Immature Granulocytes: 0 %
Lymphocytes Absolute: 2.6 x10E3/uL (ref 0.7–3.1)
Lymphs: 39 %
MCH: 29.3 pg (ref 26.6–33.0)
MCHC: 32.1 g/dL (ref 31.5–35.7)
MCV: 91 fL (ref 79–97)
Monocytes Absolute: 0.4 x10E3/uL (ref 0.1–0.9)
Monocytes: 6 %
Neutrophils Absolute: 3.5 x10E3/uL (ref 1.4–7.0)
Neutrophils: 51 %
Platelets: 194 x10E3/uL (ref 150–450)
RBC: 4.88 x10E6/uL (ref 3.77–5.28)
RDW: 12.7 % (ref 11.7–15.4)
WBC: 6.8 x10E3/uL (ref 3.4–10.8)

## 2023-11-14 LAB — LIPID PANEL
Chol/HDL Ratio: 3.8 ratio (ref 0.0–4.4)
Cholesterol, Total: 203 mg/dL — ABNORMAL HIGH (ref 100–199)
HDL: 53 mg/dL (ref >39)
LDL Chol Calc (NIH): 135 mg/dL — ABNORMAL HIGH (ref 0–99)
Triglycerides: 84 mg/dL (ref 0–149)
VLDL Cholesterol Cal: 15 mg/dL (ref 5–40)

## 2023-11-14 LAB — HEMOGLOBIN A1C
Est. average glucose Bld gHb Est-mCnc: 134 mg/dL
Hgb A1c MFr Bld: 6.3 % — ABNORMAL HIGH (ref 4.8–5.6)

## 2023-11-14 LAB — HEPATITIS B SURFACE ANTIBODY,QUALITATIVE: Hep B Surface Ab, Qual: NONREACTIVE

## 2023-11-17 ENCOUNTER — Ambulatory Visit: Payer: Self-pay | Admitting: Nurse Practitioner

## 2023-11-17 DIAGNOSIS — R7309 Other abnormal glucose: Secondary | ICD-10-CM

## 2023-11-17 MED ORDER — METFORMIN HCL 500 MG PO TABS: 500.0000 mg | ORAL_TABLET | Freq: Every day | ORAL | 1 refills | Status: AC

## 2023-11-23 ENCOUNTER — Other Ambulatory Visit: Payer: Self-pay

## 2023-11-23 ENCOUNTER — Emergency Department (HOSPITAL_BASED_OUTPATIENT_CLINIC_OR_DEPARTMENT_OTHER)
Admission: EM | Admit: 2023-11-23 | Discharge: 2023-11-23 | Disposition: A | Discharge status: Home / Self Care | Attending: Emergency Medicine | Admitting: Emergency Medicine

## 2023-11-23 ENCOUNTER — Encounter (HOSPITAL_BASED_OUTPATIENT_CLINIC_OR_DEPARTMENT_OTHER): Payer: Self-pay

## 2023-11-23 DIAGNOSIS — S6992XA Unspecified injury of left wrist, hand and finger(s), initial encounter: Secondary | ICD-10-CM | POA: Diagnosis present

## 2023-11-23 DIAGNOSIS — Z23 Encounter for immunization: Secondary | ICD-10-CM | POA: Diagnosis not present

## 2023-11-23 DIAGNOSIS — W260XXA Contact with knife, initial encounter: Secondary | ICD-10-CM | POA: Insufficient documentation

## 2023-11-23 DIAGNOSIS — S61012A Laceration without foreign body of left thumb without damage to nail, initial encounter: Secondary | ICD-10-CM | POA: Insufficient documentation

## 2023-11-23 MED ORDER — IBUPROFEN 800 MG PO TABS
800.0000 mg | ORAL_TABLET | Freq: Once | ORAL | Status: AC
Start: 2023-11-23 — End: 2023-11-23
  Administered 2023-11-23: 800 mg via ORAL
  Filled 2023-11-23: qty 1

## 2023-11-23 MED ORDER — DOXYCYCLINE HYCLATE 100 MG PO CAPS: 100.0000 mg | ORAL_CAPSULE | Freq: Two times a day (BID) | ORAL | 0 refills | Status: AC

## 2023-11-23 MED ORDER — DOXYCYCLINE HYCLATE 100 MG PO TABS
100.0000 mg | ORAL_TABLET | Freq: Once | ORAL | Status: AC
  Administered 2023-11-23: 100 mg via ORAL
  Filled 2023-11-23: qty 1

## 2023-11-23 MED ORDER — TETANUS-DIPHTH-ACELL PERTUSSIS 5-2-15.5 LF-MCG/0.5 IM SUSP
0.5000 mL | Freq: Once | INTRAMUSCULAR | Status: AC
  Administered 2023-11-23: 0.5 mL via INTRAMUSCULAR
  Filled 2023-11-23: qty 0.5

## 2023-11-23 NOTE — ED Provider Notes (Signed)
 [place] EM[facility]ENC[facility] DEPARTMENT AT [facility] Provider Note   CSN: [id number] Arrival date & time: 11/23/23  1506     Patient presents with: Laceration   Sharon Bond is a 58 y.o. female.   Patient is a 58 year old female presenting for finger laceration on her left thumb.  Patient states she was cutting unwashed, dirty-potatoes when she pressed her thumb down on a kitchen knife.  Lacerations approximately 2 cm.  Minimal bleeding.  She denies any sensation or motor dysfunction.  Unknown tetanus status.  The history is provided by the patient. No language interpreter was used.  Laceration Associated symptoms: no fever        Prior to Admission medications   Medication Sig Start Date End Date Taking? Authorizing Provider  doxycycline  (VIBRAMYCIN ) 100 MG capsule Take 1 capsule (100 mg total) by mouth 2 (two) times daily for 7 days. 11/23/23 11/30/23 Yes Elnor Hila P, DO  albuterol  (VENTOLIN  HFA) 108 (90 Base) MCG/ACT inhaler Inhale 2 puffs into the lungs every 6 (six) hours as needed for wheezing or shortness of breath. 11/06/22   Moore, Janece, FNP  azelastine  (ASTELIN ) 0.1 % nasal spray Place 2 sprays into both nostrils 2 (two) times daily. Use in each nostril as directed 05/14/23   Georgina Speaks, FNP  cetirizine  (ZYRTEC  ALLERGY) 10 MG tablet Take 1 tablet (10 mg total) by mouth at bedtime. 03/07/22 11/13/23  Joesph Shaver Scales, PA-C  cyclobenzaprine  (FLEXERIL ) 10 MG tablet Take 1 tablet (10 mg total) by mouth 3 (three) times daily as needed. 07/16/23   Georgina Speaks, FNP  diltiazem  (CARDIZEM ) 30 MG tablet Take 1 tablet (30 mg total) by mouth in the morning and at bedtime. 04/15/23   Wyn Jackee VEAR Mickey., NP  EPINEPHrine  0.3 mg/0.3 mL IJ SOAJ injection Inject 0.3 mg into the muscle as needed for anaphylaxis. 03/23/20   Moore, Janece, FNP  meloxicam  (MOBIC ) 15 MG tablet TAKE 1 TABLET (15 MG TOTAL) BY MOUTH DAILY. 09/23/23   Moore, Janece, FNP  metFORMIN (GLUCOPHAGE) 500 MG tablet  Take 1 tablet (500 mg total) by mouth daily with breakfast. 11/17/23   Georgina Speaks, FNP  Nebivolol  HCl (BYSTOLIC ) 20 MG TABS Take 1 tablet (20 mg total) by mouth daily. Can take an additional half tab as needed for palpitations 09/29/23   Wyn Jackee VEAR Mickey., NP  spironolactone  (ALDACTONE ) 25 MG tablet Take 1 tablet (25 mg total) by mouth daily. 04/15/23   Dick, Ernest H Jr., NP  triamcinolone  cream (KENALOG ) 0.1 % Apply 1 Application topically as needed. Pt using at least 4 times a day 05/14/23   Georgina Speaks, FNP  valsartan  (DIOVAN ) 320 MG tablet Take 1 tablet (320 mg total) by mouth daily. 04/15/23   Wyn Jackee VEAR Mickey., NP    Allergies: Olmesartan , Amlodipine, and Penicillins    Review of Systems  Constitutional:  Negative for chills and fever.  Skin:  Positive for wound. Negative for color change.  Neurological:  Negative for weakness and numbness.    Updated Vital Signs BP (!) 149/90   Pulse 72   Temp 98.8 F (37.1 C)   Resp 15   Ht 5' 2 (1.575 m)   Wt 104.3 kg   LMP 11/28/2012   SpO2 97%   BMI 42.07 kg/m   Physical Exam Vitals and nursing note reviewed.  Constitutional:      Appearance: Normal appearance.  HENT:     Head: Normocephalic and atraumatic.  Cardiovascular:     Rate  and Rhythm: Normal rate.  Pulmonary:     Effort: Pulmonary effort is normal.  Skin:    Findings: Wound present.      Neurological:     Mental Status: She is alert.     Sensory: Sensation is intact.     Motor: Motor function is intact.     (all labs ordered are listed, but only abnormal results are displayed) Labs Reviewed - No data to display  EKG: None  Radiology: No results found.   Procedures   Medications Ordered in the ED  doxycycline  (VIBRA -TABS) tablet 100 mg (100 mg Oral Given 11/23/23 1551)  Tdap (ADACEL) injection 0.5 mL (0.5 mLs Intramuscular Given 11/23/23 1551)  ibuprofen  (ADVIL ) tablet 800 mg (800 mg Oral Given 11/23/23 1551)                                     Medical Decision Making Risk Prescription drug management.   58 year old female presenting for finger laceration on her left thumb.  She was alert and oriented x 3, no acute distress, afebrile, so vital signs.  Physical exam demonstrates small 2 cm laceration to the left anterior thumb.  Minimal bleeding.  No foreign bodies.  Well-approximated.  Wound irrigated with cleaner and closed with Dermabond.  Hand is neurovascularly intact.  Tdap given.  Since potatoes were unwashed and potential for infection is high with dirt/money where where we will prescribe prophylactic antibiotics.  Patient in no distress and overall condition improved here in the ED. Detailed discussions were had with the patient regarding current findings, and need for close f/u with PCP or on call doctor. The patient has been instructed to return immediately if the symptoms worsen in any way for re-evaluation. Patient verbalized understanding and is in agreement with current care plan. All questions answered prior to discharge.      Final diagnoses:  Laceration of left thumb without foreign body without damage to nail, initial encounter    ED Discharge Orders          Ordered    doxycycline  (VIBRAMYCIN ) 100 MG capsule  2 times daily        11/23/23 1555               Elnor Bernarda SQUIBB, DO 11/23/23 1557

## 2023-11-23 NOTE — ED Triage Notes (Signed)
 Patient presents with 2cm superficial laceration noted to L thumb. Patient was slicing food when laceration occurred. Bleeding controlled in triage. Unknown status of tetanus vaccination.

## 2023-11-28 ENCOUNTER — Ambulatory Visit: Payer: Self-pay

## 2023-12-02 ENCOUNTER — Ambulatory Visit (INDEPENDENT_AMBULATORY_CARE_PROVIDER_SITE_OTHER)

## 2023-12-02 VITALS — BP 130/74 | HR 98 | Temp 98.6°F | Ht 62.0 in | Wt 230.0 lb

## 2023-12-02 DIAGNOSIS — Z23 Encounter for immunization: Secondary | ICD-10-CM | POA: Diagnosis not present

## 2023-12-02 NOTE — Progress Notes (Signed)
 Patient is in office today for a nurse visit for Immunization. Patient Injection was given in the  Right deltoid. Patient tolerated injection well. Patient declines being sick or taking any prednisone  in the last 30 days.

## 2023-12-23 ENCOUNTER — Encounter: Payer: Self-pay | Admitting: Nurse Practitioner

## 2023-12-23 ENCOUNTER — Ambulatory Visit: Admitting: Nurse Practitioner

## 2023-12-23 VITALS — BP 126/70 | HR 79 | Temp 98.0°F | Ht 62.0 in | Wt 226.0 lb

## 2023-12-23 DIAGNOSIS — E559 Vitamin D deficiency, unspecified: Secondary | ICD-10-CM

## 2023-12-23 DIAGNOSIS — E538 Deficiency of other specified B group vitamins: Secondary | ICD-10-CM | POA: Diagnosis not present

## 2023-12-23 DIAGNOSIS — L659 Nonscarring hair loss, unspecified: Secondary | ICD-10-CM | POA: Diagnosis not present

## 2023-12-23 DIAGNOSIS — R519 Headache, unspecified: Secondary | ICD-10-CM | POA: Diagnosis not present

## 2023-12-23 NOTE — Progress Notes (Signed)
 LILLETTE Kristeen JINNY Gladis, CMA,acting as a neurosurgeon for Sharon Ada, FNP.,have documented all relevant documentation on the behalf of Sharon Ada, FNP,as directed by  Sharon Ada, FNP while in the presence of Sharon Ada, FNP.  Subjective:  Patient ID: Sharon Bond , female    DOB: 1965/11/15 , 58 y.o.   MRN: [id number]  Chief Complaint  Patient presents with   Hair/Scalp Problem    Patient presents today for hair loss. Patient reports she has been experiencing hair loss for about a month. Patient reports she believes it is getting worse she has had no changes in her hair routine or lifestyle changes. Patient also reports scalp tenderness.  Patient reports she has a previous issue with losing her eyebrows and eye lashes before.     HPI  Discussed the use of AI scribe software for clinical note transcription with the patient, who gave verbal consent to proceed.  History of Present Illness Sharon Bond is a 57 year old female who presents with significant hair loss over the past month and a half.  She has been experiencing significant hair loss primarily in the center of her scalp over the past month and a half. Initially, the hair was shedding, but now it feels flat and is visibly shorter in the center. The hair is fragile, and she feels it ripping out when attempting to braid it. The scalp is tender and itchy, and the hair is breaking off at the edges.  She denies any recent treatments or heat application to her hair and has been using coconut and castor oil as usual. Her menstrual cycle stopped in 2019, and she has not started any new medications recently except for metformin , which she began at the end of October. She is also on spironolactone  and takes vitamin D3 once a week. She drinks about 60 ounces of water daily.  There is no family history of balding, and she does not report any significant stressors. She takes magnesium  supplements and recently purchased a collagen supplement. She has not  been taking a zinc supplement.  In the review of symptoms, she denies starting any new medications and reports no significant stress. She is concerned about the fragility of her hair and the tenderness of her scalp.  Past Medical History:  Diagnosis Date   Allergy    pollen   Anxiety 2022   Eczema    GERD (gastroesophageal reflux disease)    Hypertension    IUD (intrauterine device) in place 2005   removed 2015   Mixed incontinence    Seasonal allergic rhinitis    Sleep apnea 10/2020   11/16/20--has not rec'd cpap yet   Wears glasses      Family History  Problem Relation Age of Onset   Diabetes Mother    Hypertension Mother    Alzheimer's disease Mother    Healthy Father    Diabetes Brother    Heart disease Maternal Aunt    Healthy Son    Healthy Son    Hypertension Daughter    Healthy Daughter    Cancer Neg Hx    Stroke Neg Hx    Sleep apnea Neg Hx    Colon cancer Neg Hx    Colon polyps Neg Hx    Esophageal cancer Neg Hx    Rectal cancer Neg Hx    Stomach cancer Neg Hx      Current Outpatient Medications:    albuterol  (VENTOLIN  HFA) 108 (90 Base) MCG/ACT inhaler, Inhale 2  puffs into the lungs every 6 (six) hours as needed for wheezing or shortness of breath., Disp: 18 g, Rfl: 2   azelastine  (ASTELIN ) 0.1 % nasal spray, Place 2 sprays into both nostrils 2 (two) times daily. Use in each nostril as directed, Disp: 30 mL, Rfl: 2   cetirizine  (ZYRTEC  ALLERGY) 10 MG tablet, Take 1 tablet (10 mg total) by mouth at bedtime., Disp: 90 tablet, Rfl: 1   cyclobenzaprine  (FLEXERIL ) 10 MG tablet, Take 1 tablet (10 mg total) by mouth 3 (three) times daily as needed., Disp: 30 tablet, Rfl: 0   diltiazem  (CARDIZEM ) 30 MG tablet, Take 1 tablet (30 mg total) by mouth in the morning and at bedtime., Disp: 180 tablet, Rfl: 3   EPINEPHrine  0.3 mg/0.3 mL IJ SOAJ injection, Inject 0.3 mg into the muscle as needed for anaphylaxis., Disp: 1 each, Rfl: 1   meloxicam  (MOBIC ) 15 MG tablet,  TAKE 1 TABLET (15 MG TOTAL) BY MOUTH DAILY., Disp: 30 tablet, Rfl: 1   metFORMIN  (GLUCOPHAGE ) 500 MG tablet, Take 1 tablet (500 mg total) by mouth daily with breakfast., Disp: 90 tablet, Rfl: 1   Nebivolol  HCl (BYSTOLIC ) 20 MG TABS, Take 1 tablet (20 mg total) by mouth daily. Can take an additional half tab as needed for palpitations, Disp: 135 tablet, Rfl: 1   spironolactone  (ALDACTONE ) 25 MG tablet, Take 1 tablet (25 mg total) by mouth daily., Disp: 90 tablet, Rfl: 3   triamcinolone  cream (KENALOG ) 0.1 %, Apply 1 Application topically as needed. Pt using at least 4 times a day, Disp: 30 g, Rfl: 5   valsartan  (DIOVAN ) 320 MG tablet, Take 1 tablet (320 mg total) by mouth daily., Disp: 90 tablet, Rfl: 3   Allergies  Allergen Reactions   Olmesartan      cough   Amlodipine Cough   Penicillins Rash and Other (See Comments)    Hives, rash. No airway symptoms      Review of Systems  Constitutional: Negative.   Respiratory: Negative.    Gastrointestinal: Negative.   Neurological:  Negative for dizziness and headaches.  Psychiatric/Behavioral: Negative.       Today's Vitals   12/23/23 1551  BP: 126/70  Pulse: 79  Temp: 98 F (36.7 C)  TempSrc: Oral  Weight: 226 lb (102.5 kg)  Height: 5' 2 (1.575 m)  PainSc: 0-No pain   Body mass index is 41.34 kg/m.  Wt Readings from Last 3 Encounters:  12/23/23 226 lb (102.5 kg)  12/02/23 230 lb (104.3 kg)  11/23/23 230 lb (104.3 kg)     Objective:  Physical Exam Vitals and nursing note reviewed.  Constitutional:      General: She is not in acute distress.    Appearance: Normal appearance. She is obese.  Cardiovascular:     Rate and Rhythm: Normal rate and regular rhythm.     Pulses: Normal pulses.     Heart sounds: Normal heart sounds. No murmur heard. Pulmonary:     Effort: Pulmonary effort is normal. No respiratory distress.     Breath sounds: Normal breath sounds. No wheezing.  Musculoskeletal:        General: No swelling,  tenderness or deformity. Normal range of motion.  Skin:    General: Skin is warm and dry.     Capillary Refill: Capillary refill takes less than 2 seconds.  Neurological:     General: No focal deficit present.     Mental Status: She is alert and oriented to person, place, and time.  Cranial Nerves: No cranial nerve deficit.     Motor: No weakness.  Psychiatric:        Mood and Affect: Mood normal.        Behavior: Behavior normal.        Thought Content: Thought content normal.        Judgment: Judgment normal.      Assessment And Plan:   Assessment & Plan Hair loss Acute hair loss with fragile hair and scalp tenderness. Differential includes hormonal changes, vitamin deficiency, or alopecia. Stress and dehydration considered. - Ordered labs for iron, vitamin B, vitamin D , and thyroid levels. - Referred to dermatology. - Advised to avoid hair pulling. - Recommended 64 oz daily water intake. - Suggested multivitamin and zinc supplement. Scalp tenderness  Vitamin D  deficiency Will refill vitamin d  supplement Vitamin B12 deficiency Will recheck vitamin B12.    Orders Placed This Encounter  Procedures   TSH   Iron, TIBC and Ferritin Panel   Vitamin D  (25 hydroxy)   Vitamin B12   Ambulatory referral to Dermatology     Return for keep same next.  Patient was given opportunity to ask questions. Patient verbalized understanding of the plan and was able to repeat key elements of the plan. All questions were answered to their satisfaction.   LILLETTE Sharon Ada, FNP, have reviewed all documentation for this visit. The documentation on 12/23/2023 for the exam, diagnosis, procedures, and orders are all accurate and complete.    IF YOU HAVE BEEN REFERRED TO A SPECIALIST, IT MAY TAKE 1-2 WEEKS TO SCHEDULE/PROCESS THE REFERRAL. IF YOU HAVE NOT HEARD FROM US /SPECIALIST IN TWO WEEKS, PLEASE GIVE US  A CALL AT [phone].

## 2023-12-24 LAB — IRON,TIBC AND FERRITIN PANEL
Ferritin: 139 ng/mL (ref 15–150)
Iron Saturation: 20 % (ref 15–55)
Iron: 59 ug/dL (ref 27–159)
Total Iron Binding Capacity: 300 ug/dL (ref 250–450)
UIBC: 241 ug/dL (ref 131–425)

## 2023-12-24 LAB — VITAMIN B12: Vitamin B-12: 403 pg/mL (ref 232–1245)

## 2023-12-24 LAB — VITAMIN D 25 HYDROXY (VIT D DEFICIENCY, FRACTURES): Vit D, 25-Hydroxy: 42.6 ng/mL (ref 30.0–100.0)

## 2023-12-24 LAB — TSH: TSH: 0.788 u[IU]/mL (ref 0.450–4.500)

## 2024-01-04 ENCOUNTER — Ambulatory Visit: Payer: Self-pay | Admitting: Nurse Practitioner

## 2024-01-04 NOTE — Assessment & Plan Note (Signed)
 Acute hair loss with fragile hair and scalp tenderness. Differential includes hormonal changes, vitamin deficiency, or alopecia. Stress and dehydration considered. - Ordered labs for iron, vitamin B, vitamin D , and thyroid levels. - Referred to dermatology. - Advised to avoid hair pulling. - Recommended 64 oz daily water intake. - Suggested multivitamin and zinc supplement.

## 2024-01-04 NOTE — Assessment & Plan Note (Addendum)
 Will refill vitamin d  supplement

## 2024-01-04 NOTE — Assessment & Plan Note (Addendum)
 Will recheck vitamin B12.

## 2024-01-15 ENCOUNTER — Other Ambulatory Visit: Payer: Self-pay | Admitting: Nurse Practitioner

## 2024-01-15 DIAGNOSIS — M62838 Other muscle spasm: Secondary | ICD-10-CM

## 2024-01-15 DIAGNOSIS — M79605 Pain in left leg: Secondary | ICD-10-CM

## 2024-05-17 ENCOUNTER — Encounter: Payer: Self-pay | Admitting: Nurse Practitioner
